# Patient Record
Sex: Female | Born: 1935 | Race: White | Hispanic: No | Smoking: Former smoker
Health system: Southern US, Community
[De-identification: ages and names within clinical notes are randomized; demographics above are authoritative.]

## PROBLEM LIST (undated history)

## (undated) DIAGNOSIS — C3490 Malignant neoplasm of unspecified part of unspecified bronchus or lung: Secondary | ICD-10-CM

## (undated) DIAGNOSIS — I10 Essential (primary) hypertension: Secondary | ICD-10-CM

---

## 1998-06-20 ENCOUNTER — Emergency Department (HOSPITAL_COMMUNITY): Admission: EM | Admit: 1998-06-20 | Discharge: 1998-06-20 | Payer: Self-pay | Admitting: Emergency Medicine

## 1998-06-22 ENCOUNTER — Emergency Department (HOSPITAL_COMMUNITY): Admission: EM | Admit: 1998-06-22 | Discharge: 1998-06-22 | Payer: Self-pay | Admitting: Emergency Medicine

## 2002-10-22 ENCOUNTER — Other Ambulatory Visit: Admission: RE | Admit: 2002-10-22 | Discharge: 2002-10-22 | Payer: Self-pay | Admitting: Obstetrics and Gynecology

## 2004-02-03 ENCOUNTER — Encounter: Admission: RE | Admit: 2004-02-03 | Discharge: 2004-02-03 | Payer: Self-pay | Admitting: Family Medicine

## 2004-02-04 ENCOUNTER — Encounter: Admission: RE | Admit: 2004-02-04 | Discharge: 2004-02-04 | Payer: Self-pay | Admitting: Family Medicine

## 2004-02-06 ENCOUNTER — Ambulatory Visit (HOSPITAL_COMMUNITY): Admission: RE | Admit: 2004-02-06 | Discharge: 2004-02-06 | Payer: Self-pay | Admitting: Family Medicine

## 2004-02-06 ENCOUNTER — Encounter: Admission: RE | Admit: 2004-02-06 | Discharge: 2004-02-06 | Payer: Self-pay | Admitting: Family Medicine

## 2004-02-24 ENCOUNTER — Encounter (INDEPENDENT_AMBULATORY_CARE_PROVIDER_SITE_OTHER): Payer: Self-pay | Admitting: *Deleted

## 2004-02-24 ENCOUNTER — Inpatient Hospital Stay (HOSPITAL_COMMUNITY): Admission: RE | Admit: 2004-02-24 | Discharge: 2004-03-02 | Payer: Self-pay | Admitting: Thoracic Surgery

## 2004-02-24 HISTORY — PX: LUNG CANCER SURGERY: SHX702

## 2004-03-11 ENCOUNTER — Encounter: Admission: RE | Admit: 2004-03-11 | Discharge: 2004-03-11 | Payer: Self-pay | Admitting: Thoracic Surgery

## 2004-03-25 ENCOUNTER — Encounter: Admission: RE | Admit: 2004-03-25 | Discharge: 2004-03-25 | Payer: Self-pay | Admitting: Thoracic Surgery

## 2004-05-06 ENCOUNTER — Encounter: Admission: RE | Admit: 2004-05-06 | Discharge: 2004-05-06 | Payer: Self-pay | Admitting: Thoracic Surgery

## 2004-08-05 ENCOUNTER — Encounter: Admission: RE | Admit: 2004-08-05 | Discharge: 2004-08-05 | Payer: Self-pay | Admitting: Thoracic Surgery

## 2004-11-24 ENCOUNTER — Encounter: Admission: RE | Admit: 2004-11-24 | Discharge: 2004-11-24 | Payer: Self-pay | Admitting: Thoracic Surgery

## 2005-02-10 ENCOUNTER — Ambulatory Visit: Payer: Self-pay | Admitting: Oncology

## 2005-02-23 ENCOUNTER — Encounter: Admission: RE | Admit: 2005-02-23 | Discharge: 2005-02-23 | Payer: Self-pay | Admitting: Thoracic Surgery

## 2005-06-03 ENCOUNTER — Encounter: Admission: RE | Admit: 2005-06-03 | Discharge: 2005-06-03 | Payer: Self-pay | Admitting: Oncology

## 2005-07-16 ENCOUNTER — Ambulatory Visit: Payer: Self-pay | Admitting: Family Medicine

## 2005-07-30 ENCOUNTER — Encounter: Admission: RE | Admit: 2005-07-30 | Discharge: 2005-07-30 | Payer: Self-pay | Admitting: Family Medicine

## 2005-07-30 ENCOUNTER — Ambulatory Visit: Payer: Self-pay | Admitting: Family Medicine

## 2005-08-10 ENCOUNTER — Ambulatory Visit: Payer: Self-pay | Admitting: Family Medicine

## 2005-09-07 ENCOUNTER — Encounter: Admission: RE | Admit: 2005-09-07 | Discharge: 2005-09-07 | Payer: Self-pay | Admitting: Thoracic Surgery

## 2005-09-23 ENCOUNTER — Ambulatory Visit: Payer: Self-pay | Admitting: Oncology

## 2005-10-25 ENCOUNTER — Ambulatory Visit: Payer: Self-pay | Admitting: Family Medicine

## 2006-02-01 ENCOUNTER — Other Ambulatory Visit: Admission: RE | Admit: 2006-02-01 | Discharge: 2006-02-01 | Payer: Self-pay | Admitting: Family Medicine

## 2006-02-01 ENCOUNTER — Ambulatory Visit: Payer: Self-pay | Admitting: Family Medicine

## 2006-02-01 ENCOUNTER — Encounter: Payer: Self-pay | Admitting: Family Medicine

## 2006-02-15 ENCOUNTER — Ambulatory Visit: Payer: Self-pay | Admitting: Family Medicine

## 2006-03-08 ENCOUNTER — Encounter: Admission: RE | Admit: 2006-03-08 | Discharge: 2006-03-08 | Payer: Self-pay | Admitting: Thoracic Surgery

## 2006-04-01 ENCOUNTER — Ambulatory Visit: Payer: Self-pay | Admitting: Oncology

## 2006-04-04 LAB — CBC WITH DIFFERENTIAL/PLATELET
BASO%: 0.4 % (ref 0.0–2.0)
EOS%: 1.6 % (ref 0.0–7.0)
MCH: 30.3 pg (ref 26.0–34.0)
MCHC: 34.7 g/dL (ref 32.0–36.0)
MCV: 87.3 fL (ref 81.0–101.0)
MONO%: 11 % (ref 0.0–13.0)
RDW: 13.6 % (ref 11.3–14.5)
lymph#: 1.1 10*3/uL (ref 0.9–3.3)

## 2006-04-04 LAB — COMPREHENSIVE METABOLIC PANEL
ALT: 11 U/L (ref 0–40)
AST: 20 U/L (ref 0–37)
Albumin: 4.6 g/dL (ref 3.5–5.2)
Alkaline Phosphatase: 62 U/L (ref 39–117)
BUN: 9 mg/dL (ref 6–23)
Calcium: 9.5 mg/dL (ref 8.4–10.5)
Chloride: 97 mEq/L (ref 96–112)
Creatinine, Ser: 0.7 mg/dL (ref 0.4–1.2)
Potassium: 4.6 mEq/L (ref 3.5–5.3)

## 2006-04-29 ENCOUNTER — Ambulatory Visit: Payer: Self-pay | Admitting: Family Medicine

## 2006-05-06 ENCOUNTER — Ambulatory Visit: Payer: Self-pay | Admitting: Family Medicine

## 2006-06-21 ENCOUNTER — Ambulatory Visit: Payer: Self-pay | Admitting: Internal Medicine

## 2006-07-15 ENCOUNTER — Encounter: Admission: RE | Admit: 2006-07-15 | Discharge: 2006-07-15 | Payer: Self-pay | Admitting: Thoracic Surgery

## 2006-09-21 ENCOUNTER — Encounter: Admission: RE | Admit: 2006-09-21 | Discharge: 2006-09-21 | Payer: Self-pay | Admitting: Thoracic Surgery

## 2006-12-01 ENCOUNTER — Ambulatory Visit: Payer: Self-pay | Admitting: Oncology

## 2006-12-05 LAB — COMPREHENSIVE METABOLIC PANEL
ALT: 10 U/L (ref 0–35)
AST: 17 U/L (ref 0–37)
Albumin: 4.5 g/dL (ref 3.5–5.2)
Alkaline Phosphatase: 62 U/L (ref 39–117)
Calcium: 9.2 mg/dL (ref 8.4–10.5)
Chloride: 100 mEq/L (ref 96–112)
Creatinine, Ser: 0.64 mg/dL (ref 0.40–1.20)
Potassium: 4.1 mEq/L (ref 3.5–5.3)

## 2006-12-05 LAB — CBC WITH DIFFERENTIAL/PLATELET
BASO%: 0.4 % (ref 0.0–2.0)
EOS%: 2.6 % (ref 0.0–7.0)
MCH: 30.8 pg (ref 26.0–34.0)
MCHC: 34.8 g/dL (ref 32.0–36.0)
RDW: 13.3 % (ref 11.3–14.5)
lymph#: 1.2 10*3/uL (ref 0.9–3.3)

## 2007-03-29 ENCOUNTER — Encounter: Admission: RE | Admit: 2007-03-29 | Discharge: 2007-03-29 | Payer: Self-pay | Admitting: Thoracic Surgery

## 2007-03-29 ENCOUNTER — Ambulatory Visit: Payer: Self-pay | Admitting: Thoracic Surgery

## 2007-09-15 ENCOUNTER — Encounter: Admission: RE | Admit: 2007-09-15 | Discharge: 2007-09-15 | Payer: Self-pay | Admitting: Family Medicine

## 2007-10-04 ENCOUNTER — Encounter: Admission: RE | Admit: 2007-10-04 | Discharge: 2007-10-04 | Payer: Self-pay | Admitting: Thoracic Surgery

## 2007-10-04 ENCOUNTER — Ambulatory Visit: Payer: Self-pay | Admitting: Thoracic Surgery

## 2007-11-30 ENCOUNTER — Ambulatory Visit: Payer: Self-pay | Admitting: Oncology

## 2007-12-04 LAB — CBC WITH DIFFERENTIAL/PLATELET
BASO%: 0.6 % (ref 0.0–2.0)
Basophils Absolute: 0 10*3/uL (ref 0.0–0.1)
EOS%: 2.7 % (ref 0.0–7.0)
Eosinophils Absolute: 0.2 10*3/uL (ref 0.0–0.5)
HCT: 36.5 % (ref 34.8–46.6)
HGB: 13.1 g/dL (ref 11.6–15.9)
LYMPH%: 19.2 % (ref 14.0–48.0)
MCH: 31.1 pg (ref 26.0–34.0)
MCHC: 35.8 g/dL (ref 32.0–36.0)
MCV: 86.9 fL (ref 81.0–101.0)
MONO#: 0.7 10*3/uL (ref 0.1–0.9)
MONO%: 12.2 % (ref 0.0–13.0)
NEUT#: 4 10*3/uL (ref 1.5–6.5)
NEUT%: 65.3 % (ref 39.6–76.8)
Platelets: 230 10*3/uL (ref 145–400)
RBC: 4.2 10*6/uL (ref 3.70–5.32)
RDW: 13.1 % (ref 11.3–14.5)
WBC: 6.1 10*3/uL (ref 3.9–10.0)
lymph#: 1.2 10*3/uL (ref 0.9–3.3)

## 2007-12-04 LAB — COMPREHENSIVE METABOLIC PANEL
ALT: 16 U/L (ref 0–35)
AST: 20 U/L (ref 0–37)
Albumin: 4.6 g/dL (ref 3.5–5.2)
Alkaline Phosphatase: 64 U/L (ref 39–117)
BUN: 14 mg/dL (ref 6–23)
CO2: 26 mEq/L (ref 19–32)
Calcium: 9.9 mg/dL (ref 8.4–10.5)
Chloride: 98 mEq/L (ref 96–112)
Creatinine, Ser: 0.73 mg/dL (ref 0.40–1.20)
Glucose, Bld: 113 mg/dL — ABNORMAL HIGH (ref 70–99)
Potassium: 4.1 mEq/L (ref 3.5–5.3)
Sodium: 137 mEq/L (ref 135–145)
Total Bilirubin: 0.5 mg/dL (ref 0.3–1.2)
Total Protein: 7.3 g/dL (ref 6.0–8.3)

## 2007-12-05 ENCOUNTER — Ambulatory Visit (HOSPITAL_COMMUNITY): Admission: RE | Admit: 2007-12-05 | Discharge: 2007-12-05 | Payer: Self-pay | Admitting: Oncology

## 2007-12-07 ENCOUNTER — Ambulatory Visit: Payer: Self-pay | Admitting: Family Medicine

## 2007-12-07 DIAGNOSIS — IMO0002 Reserved for concepts with insufficient information to code with codable children: Secondary | ICD-10-CM

## 2008-03-27 ENCOUNTER — Encounter: Admission: RE | Admit: 2008-03-27 | Discharge: 2008-03-27 | Payer: Self-pay | Admitting: Thoracic Surgery

## 2008-03-27 ENCOUNTER — Ambulatory Visit: Payer: Self-pay | Admitting: Thoracic Surgery

## 2008-05-23 ENCOUNTER — Ambulatory Visit: Payer: Self-pay | Admitting: Internal Medicine

## 2008-06-14 ENCOUNTER — Ambulatory Visit: Payer: Self-pay | Admitting: Family Medicine

## 2008-06-14 DIAGNOSIS — L255 Unspecified contact dermatitis due to plants, except food: Secondary | ICD-10-CM | POA: Insufficient documentation

## 2008-09-17 ENCOUNTER — Encounter: Payer: Self-pay | Admitting: Family Medicine

## 2008-09-18 ENCOUNTER — Ambulatory Visit: Payer: Self-pay | Admitting: Family Medicine

## 2008-09-18 ENCOUNTER — Other Ambulatory Visit: Admission: RE | Admit: 2008-09-18 | Discharge: 2008-09-18 | Payer: Self-pay | Admitting: Family Medicine

## 2008-09-18 ENCOUNTER — Encounter: Payer: Self-pay | Admitting: Family Medicine

## 2008-09-18 DIAGNOSIS — I10 Essential (primary) hypertension: Secondary | ICD-10-CM | POA: Insufficient documentation

## 2008-09-18 DIAGNOSIS — E785 Hyperlipidemia, unspecified: Secondary | ICD-10-CM

## 2008-09-18 DIAGNOSIS — Z85118 Personal history of other malignant neoplasm of bronchus and lung: Secondary | ICD-10-CM

## 2008-09-18 DIAGNOSIS — M818 Other osteoporosis without current pathological fracture: Secondary | ICD-10-CM

## 2008-09-18 LAB — CONVERTED CEMR LAB
Bilirubin Urine: NEGATIVE
Nitrite: NEGATIVE
Urobilinogen, UA: 0.2

## 2008-09-19 LAB — CONVERTED CEMR LAB
ALT: 16 units/L (ref 0–35)
Basophils Relative: 0.5 % (ref 0.0–3.0)
Bilirubin, Direct: 0.1 mg/dL (ref 0.0–0.3)
CO2: 30 meq/L (ref 19–32)
Calcium: 9.5 mg/dL (ref 8.4–10.5)
Creatinine, Ser: 0.7 mg/dL (ref 0.4–1.2)
Eosinophils Relative: 1.7 % (ref 0.0–5.0)
Glucose, Bld: 107 mg/dL — ABNORMAL HIGH (ref 70–99)
Hemoglobin: 14 g/dL (ref 12.0–15.0)
LDL Cholesterol: 90 mg/dL (ref 0–99)
Lymphocytes Relative: 19.4 % (ref 12.0–46.0)
Monocytes Relative: 13.1 % — ABNORMAL HIGH (ref 3.0–12.0)
Neutro Abs: 3.5 10*3/uL (ref 1.4–7.7)
RBC: 4.43 M/uL (ref 3.87–5.11)
TSH: 1.78 microintl units/mL (ref 0.35–5.50)
Total CHOL/HDL Ratio: 3.3
Total Protein: 7.5 g/dL (ref 6.0–8.3)

## 2008-10-03 ENCOUNTER — Ambulatory Visit: Payer: Self-pay | Admitting: Thoracic Surgery

## 2008-10-03 ENCOUNTER — Encounter: Admission: RE | Admit: 2008-10-03 | Discharge: 2008-10-03 | Payer: Self-pay | Admitting: Thoracic Surgery

## 2008-10-15 ENCOUNTER — Encounter: Admission: RE | Admit: 2008-10-15 | Discharge: 2008-10-15 | Payer: Self-pay | Admitting: Family Medicine

## 2008-11-28 ENCOUNTER — Ambulatory Visit: Payer: Self-pay | Admitting: Oncology

## 2009-04-09 ENCOUNTER — Ambulatory Visit: Payer: Self-pay | Admitting: Thoracic Surgery

## 2009-04-09 ENCOUNTER — Encounter: Admission: RE | Admit: 2009-04-09 | Discharge: 2009-04-09 | Payer: Self-pay | Admitting: Thoracic Surgery

## 2009-05-28 ENCOUNTER — Encounter (INDEPENDENT_AMBULATORY_CARE_PROVIDER_SITE_OTHER): Payer: Self-pay | Admitting: *Deleted

## 2009-09-15 ENCOUNTER — Telehealth: Payer: Self-pay | Admitting: Family Medicine

## 2009-11-11 ENCOUNTER — Encounter: Admission: RE | Admit: 2009-11-11 | Discharge: 2009-11-11 | Payer: Self-pay | Admitting: Oncology

## 2009-12-01 ENCOUNTER — Ambulatory Visit: Payer: Self-pay | Admitting: Oncology

## 2009-12-03 ENCOUNTER — Ambulatory Visit: Payer: Self-pay | Admitting: Family Medicine

## 2009-12-03 DIAGNOSIS — R51 Headache: Secondary | ICD-10-CM

## 2009-12-03 DIAGNOSIS — R519 Headache, unspecified: Secondary | ICD-10-CM | POA: Insufficient documentation

## 2009-12-04 LAB — CBC WITH DIFFERENTIAL/PLATELET
BASO%: 1.3 % (ref 0.0–2.0)
HCT: 37 % (ref 34.8–46.6)
MCHC: 34.6 g/dL (ref 31.5–36.0)
MONO#: 0.8 10*3/uL (ref 0.1–0.9)
NEUT%: 68 % (ref 38.4–76.8)
RBC: 4.11 10*6/uL (ref 3.70–5.45)
WBC: 5.9 10*3/uL (ref 3.9–10.3)
lymph#: 0.9 10*3/uL (ref 0.9–3.3)

## 2009-12-04 LAB — COMPREHENSIVE METABOLIC PANEL
ALT: 13 U/L (ref 0–35)
Albumin: 4.5 g/dL (ref 3.5–5.2)
CO2: 29 mEq/L (ref 19–32)
Calcium: 9.9 mg/dL (ref 8.4–10.5)
Chloride: 94 mEq/L — ABNORMAL LOW (ref 96–112)
Sodium: 133 mEq/L — ABNORMAL LOW (ref 135–145)
Total Protein: 7.3 g/dL (ref 6.0–8.3)

## 2009-12-04 LAB — LACTATE DEHYDROGENASE: LDH: 171 U/L (ref 94–250)

## 2009-12-15 ENCOUNTER — Ambulatory Visit: Payer: Self-pay | Admitting: Family Medicine

## 2010-01-20 ENCOUNTER — Telehealth: Payer: Self-pay | Admitting: Internal Medicine

## 2010-02-02 ENCOUNTER — Ambulatory Visit: Payer: Self-pay | Admitting: Family Medicine

## 2010-04-09 ENCOUNTER — Ambulatory Visit (HOSPITAL_COMMUNITY): Admission: RE | Admit: 2010-04-09 | Discharge: 2010-04-09 | Payer: Self-pay | Admitting: Oncology

## 2010-12-02 ENCOUNTER — Ambulatory Visit: Payer: Self-pay | Admitting: Oncology

## 2010-12-04 ENCOUNTER — Encounter: Payer: Self-pay | Admitting: Family Medicine

## 2010-12-04 LAB — CBC WITH DIFFERENTIAL/PLATELET
Basophils Absolute: 0 10*3/uL (ref 0.0–0.1)
Eosinophils Absolute: 0.1 10*3/uL (ref 0.0–0.5)
LYMPH%: 18.1 % (ref 14.0–49.7)
MCV: 90.2 fL (ref 79.5–101.0)
MONO%: 13.4 % (ref 0.0–14.0)
NEUT%: 66.4 % (ref 38.4–76.8)
Platelets: 229 10*3/uL (ref 145–400)
RDW: 12.9 % (ref 11.2–14.5)
WBC: 5.6 10*3/uL (ref 3.9–10.3)

## 2010-12-04 LAB — COMPREHENSIVE METABOLIC PANEL
Albumin: 4.6 g/dL (ref 3.5–5.2)
Alkaline Phosphatase: 65 U/L (ref 39–117)
BUN: 6 mg/dL (ref 6–23)
Creatinine, Ser: 0.75 mg/dL (ref 0.40–1.20)
Glucose, Bld: 97 mg/dL (ref 70–99)
Potassium: 4.3 mEq/L (ref 3.5–5.3)

## 2010-12-29 ENCOUNTER — Encounter
Admission: RE | Admit: 2010-12-29 | Discharge: 2010-12-29 | Payer: Self-pay | Source: Home / Self Care | Attending: Family Medicine | Admitting: Family Medicine

## 2011-01-16 ENCOUNTER — Encounter: Payer: Self-pay | Admitting: Family Medicine

## 2011-01-16 ENCOUNTER — Encounter: Payer: Self-pay | Admitting: Thoracic Surgery

## 2011-01-17 ENCOUNTER — Encounter: Payer: Self-pay | Admitting: Thoracic Surgery

## 2011-01-24 LAB — CONVERTED CEMR LAB
Albumin: 4.3 g/dL (ref 3.5–5.2)
Basophils Relative: 0.1 % (ref 0.0–3.0)
CO2: 29 meq/L (ref 19–32)
Chloride: 101 meq/L (ref 96–112)
Cholesterol: 152 mg/dL (ref 0–200)
Creatinine, Ser: 0.7 mg/dL (ref 0.4–1.2)
Direct LDL: 86.9 mg/dL
Eosinophils Absolute: 0.1 10*3/uL (ref 0.0–0.7)
HCT: 37.9 % (ref 36.0–46.0)
Hemoglobin: 13 g/dL (ref 12.0–15.0)
Lymphs Abs: 1.3 10*3/uL (ref 0.7–4.0)
MCHC: 34.3 g/dL (ref 30.0–36.0)
MCV: 90.4 fL (ref 78.0–100.0)
Monocytes Absolute: 0.7 10*3/uL (ref 0.1–1.0)
Neutro Abs: 4.6 10*3/uL (ref 1.4–7.7)
Neutrophils Relative %: 68 % (ref 43.0–77.0)
RBC: 4.19 M/uL (ref 3.87–5.11)
TSH: 1.58 microintl units/mL (ref 0.35–5.50)
Total CHOL/HDL Ratio: 3
Total Protein: 7.6 g/dL (ref 6.0–8.3)

## 2011-01-26 NOTE — Progress Notes (Signed)
Summary: Schedule recall colon  Phone Note Outgoing Call Call back at [facility] Phone [phone]   Call placed by: Christie Nottingham CMA Duncan Dull),  January 20, 2010 1:46 PM Call placed to: Patient Summary of Call: Called pt to schedule recall colon. Pt states she is going on a trip soon and will be back in a month. Pt wants to schedule her colon after she gets back from her trip but she does have a reminder note to herself to call back and schedule. I informed pt of the importance of scheduling her recall colon at this time due to her prior polyps. Pt agreed and states she will call back. Initial call taken by: Christie Nottingham CMA Duncan Dull),  January 20, 2010 1:48 PM

## 2011-01-26 NOTE — Assessment & Plan Note (Signed)
Summary: PT WILL COME IN FASTING // RS   Vital Signs:  Patient profile:   76 year old female Menstrual status:  postmenopausal Height:      63.25 inches Weight:      155 pounds Temp:     98.4 degrees F oral BP sitting:   128 / 86  (left arm) Cuff size:   regular  Vitals Entered By: Kern Reap CMA Duncan Dull) (February 02, 2010 9:53 AM)  Reason for Visit cpx  History of Present Illness: Frances Gonzalez is a 76 year old divorced female, ex-smoker.......... status post surgery 2005 for lung cancer, who comes in today for follow-up and evaluation.  She takes simvastatin 20 mg nightly for hyperlipidemia.  Will check lipid panel.  She takes losartan 100 -- 25 daily for hypertension.  BP 120/86.  BPs at home are averaging 110/70.  She denies being lightheaded when she stands.  Dr. Dennie Bible her surgeon has released her .  Dr. Roe Coombs is following her on an annual basis because of a history of lung cancer.  She had a CT scan last April, which was normal.  He is ordered.  A follow-up chest x-ray for this April.  She remains asymptomatic and  She gets routine eye care.  Dental care.  Colonoscopy done, and GI because of a history of colon polyps.  Tetanus 2006, flu, 2009, Pneumovax 2005, shingles 2009.  Allergies (verified): No Known Drug Allergies  Past History:  Past medical, surgical, family and social histories (including risk factors) reviewed, and no changes noted (except as noted below).  Past Medical History: Reviewed history from 09/18/2008 and no changes required. lung cancer 2005 colon polyps 2005 osteoporosis COPD hyperlipidemia Hypertension  Family History: Reviewed history and no changes required.  Social History: Reviewed history from 09/18/2008 and no changes required. Retired Single Former Smoker Alcohol use-no Drug use-no Regular exercise-yes  Review of Systems      See HPI  Physical Exam  General:  Well-developed,well-nourished,in no acute distress; alert,appropriate  and cooperative throughout examination Head:  Normocephalic and atraumatic without obvious abnormalities. No apparent alopecia or balding. Eyes:  No corneal or conjunctival inflammation noted. EOMI. Perrla. Funduscopic exam benign, without hemorrhages, exudates or papilledema. Vision grossly normal.juvenile cataracts Ears:  External ear exam shows no significant lesions or deformities.  Otoscopic examination reveals clear canals, tympanic membranes are intact bilaterally without bulging, retraction, inflammation or discharge. Hearing is grossly normal bilaterally. Nose:  External nasal examination shows no deformity or inflammation. Nasal mucosa are pink and moist without lesions or exudates. Mouth:  Oral mucosa and oropharynx without lesions or exudates.  Teeth in good repair. Neck:  No deformities, masses, or tenderness noted. Chest Wall:  scar right lateral chest wall from previous lung surgery Breasts:  exam negative, except for a pea-sized, soft, movable, fibrous cystic lesion, left breast 12 o'clock, 1 inch from the nipple.  Mammography in the fall.  Normal this lesion is known to the patient.  It's unchanged in her exam also Lungs:  Normal respiratory effort, chest expands symmetrically. Lungs are clear to auscultation, no crackles or wheezes. Heart:  Normal rate and regular rhythm. S1 and S2 normal without gallop, murmur, click, rub or other extra sounds. Abdomen:  Bowel sounds positive,abdomen soft and non-tender without masses, organomegaly or hernias noted. Msk:  No deformity or scoliosis noted of thoracic or lumbar spine.   Pulses:  R and L carotid,radial,femoral,dorsalis pedis and posterior tibial pulses are full and equal bilaterally Extremities:  No clubbing, cyanosis, edema, or  deformity noted with normal full range of motion of all joints.   Neurologic:  No cranial nerve deficits noted. Station and gait are normal. Plantar reflexes are down-going bilaterally. DTRs are symmetrical  throughout. Sensory, motor and coordinative functions appear intact. Skin:  Intact without suspicious lesions or rashes Cervical Nodes:  No lymphadenopathy noted Axillary Nodes:  No palpable lymphadenopathy Inguinal Nodes:  No significant adenopathy Psych:  Cognition and judgment appear intact. Alert and cooperative with normal attention span and concentration. No apparent delusions, illusions, hallucinations   Impression & Recommendations:  Problem # 1:  HYPERLIPIDEMIA (ICD-272.4) Assessment Improved  Her updated medication list for this problem includes:    Simvastatin 20 Mg Tabs (Simvastatin) .Marland Kitchen... 1/2 tab at bedtime  Orders: Venipuncture (78295) TLB-Lipid Panel (80061-LIPID) TLB-BMP (Basic Metabolic Panel-BMET) (80048-METABOL) TLB-CBC Platelet - w/Differential (85025-CBCD) TLB-Hepatic/Liver Function Pnl (80076-HEPATIC) TLB-TSH (Thyroid Stimulating Hormone) (62130-QMV) Prescription Created Electronically [phone]) UA Dipstick w/o Micro (automated)  (81003)  Problem # 2:  HYPERTENSION (ICD-401.9) Assessment: Improved  The following medications were removed from the medication list:    Hydrochlorothiazide 25 Mg Tabs (Hydrochlorothiazide) .Marland Kitchen... 1 tab qam    Diovan 160 Mg Tabs (Valsartan) ..... One by mouth qd Her updated medication list for this problem includes:    Hyzaar 100-25 Mg Tabs (Losartan potassium-hctz) .Marland Kitchen... 1/2 qam  Orders: Venipuncture (62952) TLB-Lipid Panel (80061-LIPID) TLB-BMP (Basic Metabolic Panel-BMET) (80048-METABOL) TLB-CBC Platelet - w/Differential (85025-CBCD) TLB-Hepatic/Liver Function Pnl (80076-HEPATIC) TLB-TSH (Thyroid Stimulating Hormone) (84132-GMW) Prescription Created Electronically [phone]) UA Dipstick w/o Micro (automated)  (81003) EKG w/ Interpretation (93000)  Problem # 3:  OTHER OSTEOPOROSIS (ICD-733.09) Assessment: Unchanged  Orders: Venipuncture (53664) TLB-Lipid Panel (80061-LIPID) TLB-BMP (Basic Metabolic Panel-BMET)  (80048-METABOL) TLB-CBC Platelet - w/Differential (85025-CBCD) TLB-Hepatic/Liver Function Pnl (80076-HEPATIC) TLB-TSH (Thyroid Stimulating Hormone) (40347-QQV) Prescription Created Electronically [phone]) UA Dipstick w/o Micro (automated)  (81003)  Complete Medication List: 1)  Simvastatin 20 Mg Tabs (Simvastatin) .... 1/2 tab at bedtime 2)  Prilosec 20  .... 1/2 tab once daily 3)  Mvi  4)  Caltrate  5)  Hyzaar 100-25 Mg Tabs (Losartan potassium-hctz) .... 1/2 qam 6)  Aspir-low 81 Mg Tbec (Aspirin) .... Once daily 7)  Calcuim Plus Vit D 1800  .... Once daily  Patient Instructions: 1)  Please schedule a follow-up appointment in 1 year. 2)  It is important that you exercise regularly at least 20 minutes 5 times a week. If you develop chest pain, have severe difficulty breathing, or feel very tired , stop exercising immediately and seek medical attention. 3)  Schedule your mammogram. 4)  Schedule a colonoscopy/sigmoidoscopy to help detect colon cancer. 5)  Take calcium +Vitamin D daily. 6)  Take an Aspirin every day. 7)  also split the losartan.... one half tab daily.  Check her BP daily for 4 weeks to be sure your blood pressure stays normal Prescriptions: SIMVASTATIN 20 MG  TABS (SIMVASTATIN) 1/2 TAB at bedtime  #50 x 3   Entered and Authorized by:   Roderick Pee MD   Signed by:   Roderick Pee MD on 02/02/2010   Method used:   Electronically to        [facility]  [address] [phone]* (retail)       [address]       [place], [place]  [address]       Ph: [phone] or [phone]       Fax: [phone]   RxID:   [id number]  100-25 MG TABS (LOSARTAN POTASSIUM-HCTZ) 1/2 qam  #50 x 3   Entered and Authorized by:   Roderick Pee MD   Signed by:   Roderick Pee MD on 02/02/2010   Method used:   Electronically to        [facility]  [address] [phone]* (retail)       [address]       [place], [place]  [address]       Ph:  [phone] or [phone]       Fax: [phone]   RxID:   [id number]

## 2011-01-28 NOTE — Letter (Signed)
Summary:  Cancer Center  [facility]   Imported By: Maryln Gottron 12/11/2010 13:32:31  _____________________________________________________________________  External Attachment:    Type:   Image     Comment:   External Document

## 2011-03-18 ENCOUNTER — Other Ambulatory Visit: Payer: Self-pay | Admitting: Family Medicine

## 2011-04-13 ENCOUNTER — Other Ambulatory Visit (HOSPITAL_COMMUNITY): Payer: Self-pay | Admitting: Oncology

## 2011-04-13 ENCOUNTER — Ambulatory Visit (HOSPITAL_COMMUNITY)
Admission: RE | Admit: 2011-04-13 | Discharge: 2011-04-13 | Disposition: A | Discharge status: Home / Self Care | Payer: Medicare Other | Source: Ambulatory Visit | Attending: Oncology | Admitting: Oncology

## 2011-04-13 DIAGNOSIS — C3490 Malignant neoplasm of unspecified part of unspecified bronchus or lung: Secondary | ICD-10-CM | POA: Insufficient documentation

## 2011-04-13 DIAGNOSIS — X58XXXA Exposure to other specified factors, initial encounter: Secondary | ICD-10-CM | POA: Insufficient documentation

## 2011-04-13 DIAGNOSIS — IMO0002 Reserved for concepts with insufficient information to code with codable children: Secondary | ICD-10-CM | POA: Insufficient documentation

## 2011-04-13 DIAGNOSIS — Z09 Encounter for follow-up examination after completed treatment for conditions other than malignant neoplasm: Secondary | ICD-10-CM | POA: Insufficient documentation

## 2011-05-11 NOTE — Letter (Signed)
March 27, 2008   Samul Dada, M.D.  [address]  [place], [place] [address]   Re:  INNA, TISDELL                DOB:  [birth year]   Dear Roe Coombs:   I saw Ms. Luppino back in the office today.  Her CT scan at four years  shows no evidence of recurrence of her cancer.  She has a small little  nodule in the left lower lobe that has been stable.  She is doing well  overall.  Her blood pressure was a little elevated at 180/90.  Pulse 84,  respirations 18.  Saturations were 94%.  I plan to see her back again in  six months with a chest x-ray.   Ines Bloomer, M.D.  Electronically Signed   DPB/MEDQ  D:  03/27/2008  T:  03/27/2008  Job:  [id number]

## 2011-05-11 NOTE — Letter (Signed)
October 04, 2007   Samul Dada, M.D.  [address]  [place],  [place] [address]   Re:  ALIZZON, DIOGUARDI                DOB:  11-21-1935   Dear Roe Coombs:   I saw Ms. Spampinato back in the office today. Her blood pressure was  176/90, pulse 78, respirations 18 and sats were 96%. Lungs were clear to  auscultation and percussion. She is 3-1/2 years since her surgery. Chest  x-ray showed changes on the right and questionable nodule on the left  side, but that was part of what they thought was a nipple shadow. I  reviewed her old CT scan from six months ago and there was a small  subdiaphragmatic 4-mm nodule at that time. Otherwise, I plan to see her  again in six months with another CT scan. I think she will be seeing you  in December.   Ines Bloomer, M.D.  Electronically Signed   DPB/MEDQ  D:  10/04/2007  T:  10/04/2007  Job:  [id number]

## 2011-05-11 NOTE — Assessment & Plan Note (Signed)
OFFICE VISIT   Frances Gonzalez, Frances Gonzalez  DOB:  1935-06-14                                        October 03, 2008  CHART #:  [id number]   The patient came today.  She is 4-1/2 years since we did a right upper  lobectomy for 1.6-cm adenocarcinoma.  Chest x-ray showed no evidence of  recurrence.  She is having some mild shortness of breath and suggested  she check with Dr. Tawanna Cooler on this.  Her blood pressure is 176/98, pulse  72, respirations 18, and sats were 96%.  I will see her back again in 6  months with a CT scan for a final check.   Ines Bloomer, M.D.  Electronically Signed   DPB/MEDQ  D:  10/03/2008  T:  10/03/2008  Job:  [id number]

## 2011-05-11 NOTE — Letter (Signed)
April 09, 2009   Samul Dada, MD  [address]  [place], [place] [address]   [name], Frances Gonzalez                DOB:  06-27-1935   Dear Dr. Arline Asp:   I saw the patient today.  It has been 5 years since we did her surgery  for a right upper lobectomy for T1, N0, M0 adenocarcinoma.  There is no  evidence of recurrence.  For the last year or so, we have been following  a 6-mm lesion in the left lower lobe and that has not changed in the CT  scan today.  Because it has been over 5 years, I will refer her back to  you for long-term followup.  I would suggest she get a CT scan at least  once a year.  She will be seeing you in December 2010.  Her blood  pressure is 160/100, pulse 69, respirations 18, and saturations were  97%.   Ines Bloomer, M.D.  Electronically Signed   DPB/MEDQ  D:  04/09/2009  T:  04/10/2009  Job:  [id number]

## 2011-05-14 NOTE — Op Note (Signed)
NAME:  EMERSEN, MASCARI                          ACCOUNT NO.:  [id number]   MEDICAL RECORD NO.:  [id number]                   PATIENT TYPE:  OIB   LOCATION:  2899                                 FACILITY:  [facility]   PHYSICIAN:  Ines Bloomer, M.D.              DATE OF BIRTH:  12-16-1935   DATE OF PROCEDURE:  02/24/2004  DATE OF DISCHARGE:                                 OPERATIVE REPORT   PREOPERATIVE DIAGNOSIS:  Right upper lobe mass.   POSTOPERATIVE DIAGNOSIS:  Nonsmall cell cancer, right upper lobe.   OPERATION PERFORMED:  Right video assisted thoracoscopic surgery, right  thoracotomy, right upper lobectomy.   SURGEON:  Ines Bloomer, M.D.   ASSISTANT:  Rowe Clack, P.A.-C.   ANESTHESIA:  General.   DESCRIPTION OF PROCEDURE:  After percutaneous insertion of all monitoring  lines, the patient underwent general anesthesia, was prepped and draped in  the usual sterile manner.  Turned to right lateral thoracotomy position and  a dual lumen tube was inserted.  Two trocar sites were made in the anterior  and posterior axillary line at the 7th intercostal space.  Two trocars were  inserted.  A 30 degree scope was inserted.  The patient had some emphysema  but no intrapleural spread of the cancer could be seen so posterolateral  thoracotomy was made, was carried down with electrocautery to the  subcutaneous tissue.  Latissimus was partially divided.  The serratus was  reflected anteriorly.  Two Tuffier's were placed at right angles.  The  lesion could be palpated in the apical posterior segment of the right upper  lobe.  It was about 1 cm in size.  Dissection was started in the fissure and  dissection was carried down to the pulmonary artery dissecting off 11R node.  The posterior branch of the pulmonary artery was then dissected out, doubly  ligated and divided with 2-0 silk, clipped with clips and divided.  Another  small branch of the transverse vein was also divided with 2-0  silks and  divided.  The fissure was divided with the EZ45 stapler with two  applications.  After this had been done attention was turned anteriorly and  dissection was carried superior to the azygous vein up into the mediastinum  and several 4R nodes were dissected free doing a mediastinal dissection.  Then the apical posterior branch was looped with vascular tape, stapled and  divided with the autosuture 30 stapler.  The superior pulmonary vein branch  to the upper lobe was dissected out, looped with a vascular tape, stapled  and divided with the autosuture stapler.  Finally attention was turned to  the bronchus and there was a large 10R node on the bronchus which was  dissected free.  There was worry this may involved with cancer.  Another 10R  node was dissected free and another 11R node was dissected free from the  bronchus intermedius.  The bronchus was stapled with a TL30 stapler divided  distally and reinforced with 4-0 Prolene in interrupted fashion.  The rest  of the minor fissure was stapled and divided with the EZ45 stapler.  The  right upper lobe removed, bronchial margins were negative.  There was an  adenocarcinoma about 1 to 1.5 cm in size in the right upper lobe.  The  inferior pulmonary  ligament was taken down and two chest tubes were brought through separate  stab wounds, tied in place with 0 silk.  The chest was closed with two  pericostals, #1 Vicryl in the muscle layer, 2-0 Vicryl in the subcutaneous  tissue and Dermabond for the skin.  The patient was then transferred to the  recovery room in stable condition.                                               Ines Bloomer, M.D.    DPB/MEDQ  D:  02/24/2004  T:  02/24/2004  Job:  [id number]

## 2011-05-14 NOTE — Consult Note (Signed)
NAME:  Frances, Gonzalez                          ACCOUNT NO.:  [id number]   MEDICAL RECORD NO.:  [id number]                   PATIENT TYPE:  OUT   LOCATION:  XRAY                                 FACILITY:  [facility]   PHYSICIAN:  Marlowe Kays, P.A.                  DATE OF BIRTH:  Apr 07, 1935   DATE OF CONSULTATION:  02/26/2004  DATE OF DISCHARGE:                                   CONSULTATION   REASON FOR CONSULTATION:  Lung cancer.   HISTORY OF PRESENT ILLNESS:  Frances Gonzalez is a pleasant 76 year old female  asked to see in consultation for evaluation of lung cancer.  She was found  to have a incidental right upper lobe mass per chest x-ray during a routine  physical exam after she complained of cough symptoms which lead to a  subsequent follow-up CT.  This showed a 12 mm nodule in the central right  upper lobe with spiculations.  No lymphadenopathy.  She also had a 5 mm left  lower lobe nodule as well.   On February 06, 2004, she underwent a PET scan showing increased uptake in  the right upper lobe nodule with an SUV of 5.1 suspicious for malignancy.  No activity in the left lower lobe nodule.  The right upper lobectomy was  performed on February 24, 2004, with a pathology report (Dr. Clelia Croft, [id number])  positive for a maximum tumor size of 1.6 cm, adenocarcinoma, acinar type,  grade 2, moderate differentiated negative margins, and negative pleural  involvement.  No direct extension or vascular invasion.  All six lymph nodes  were negative.  TNM code was T1 N0 MX, stage 1A.  A CT of the brain is  pending.   PAST MEDICAL HISTORY:  1. Lung cancer, as above, recently diagnosed.  2. History of tobacco abuse.   PAST SURGICAL HISTORY:  Status post right upper lobectomy by Dr. Ines Bloomer, February 24, 2004.   ALLERGIES:  No known drug allergies.   MEDICATIONS:  1. Dulcolax p.r.n.  2. Morphine as directed.  3. Benadryl.  4. Dalmane  5. Narcan.  6. Percocet.  7. Zosyn.  8.  Ultram p.r.n.  9. Ferrous sulfate p.o. b.i.d.  10.      Of note, the patient was on no medications prior to surgeries.   REVIEW OF SYMPTOMS:  See HPI for significant positives.  She denies any  fever, chills, weight loss, decrease in appetite, or headaches.  No blurred  vision.  No shortness of breath, dyspnea on exertion, productive cough,  hematemesis, or chest pain at this time.  No palpitations.  No abdominal  pain, nausea, vomiting, diarrhea, constipation, hematochezia, dysphagia,  dysuria, gross hematuria, dysesthesias, or peripheral edema.   FAMILY HISTORY:  Significant for mother dying at 73 with severe rheumatoid  arthritis.  No history of cancer in the family. No history of COPD, CAD,  diabetes in  the family.   SOCIAL HISTORY:  The patient is divorced.  She has one grown child.  She is  a Diplomatic Services operational officer at [employer]. She smokes one to one and a half pack of  tobacco for the last 50 years, although she quit a few weeks ago prior to  the procedure.  The patient lives in [place], [place].   HEALTH MAINTENANCE:  She has never had a colonoscopy.  She is up-to-date  with flu shot.  Her last bone density scan was in February of 2005.  She has  never had a transfusion.   PHYSICAL EXAMINATION:  GENERAL:  This is a well-developed, well-nourished 59-  year-old white female in no acute distress. Alert and oriented x 3.  VITAL SIGNS:  Blood pressure 105/55, pulse of 97, respirations of 20,  temperature 98.2.  Weight 138.8 pounds.  Pulse oximetry 90% on one liter.  HEENT:  Normocephalic and atraumatic.  PERRLA.  Oral mucosa without visible  thrush or lesion.  NECK:  Supple.  No JVD.  There is a right thick line.  No cervical or  supraclavicular masses.  CHEST:  Symmetrical on inspiration.  LUNGS:  No wheezes.  There is some diffuse rhonchi.  No rales.  No axillary  masses.  There is a presence of a chest tube in the right side of her  posterior chest.  Her right upper  lobectomy site is clean and dry.  BREASTS:  Without masses.  She had a mammogram on February 06, 2004 which  was negative.  CARDIOVASCULAR:  Regular rate and rhythm with no murmurs, rubs, or gallops.  ABDOMEN:  Soft and nontender.  Bowel sounds are full. No palpable spleen or  liver.  GENITOURINARY:  Deferred.  RECTAL:  Deferred.  EXTREMITIES:  No clubbing, cyanosis, or edema.  SKIN:  Without visible or active bleeding.  NEUROLOGICAL:  Nonfocal.   LABORATORY DATA:  Preoperatively, hemoglobin 14, hematocrit 40.2, WBC 5.9,  platelets 204,000.  MCV 90.4, PT 11.9, PTT 32, INR 0.8.  Urinalysis is  negative.  Sodium is 132, potassium 3.9, BUN 1, creatinine 0.5, glucose 153,  total bilirubin 0.5, alkaline phosphate 61, AST 25, ALT 12. Total protein  4.9, albumin 2.7, calcium 7.8 (it was 9.5 preoperatively).   ASSESSMENT:  Stage IA lung cancer.  The case has been discussed with Dr.  Gerarda Fraction Murinson, whose recommendations are no adjuvant therapy necessary  at this time. We will be happy to follow the patient with Dr. Ines Bloomer after discharge.  Thank you very much for allowing [place] to participate  in the care of Frances Gonzalez.                                               Marlowe Kays, P.A.    SW/MEDQ  D:  02/27/2004  T:  02/27/2004  Job:  [id number]   cc:   Tinnie Gens A. Tawanna Cooler, M.D. [facility]

## 2011-05-14 NOTE — Discharge Summary (Signed)
NAME:  Frances Gonzalez, Frances Gonzalez                          ACCOUNT NO.:  [id number]   MEDICAL RECORD NO.:  [id number]                   PATIENT TYPE:  INP   LOCATION:  3306                                 FACILITY:  [facility]   PHYSICIAN:  Ines Bloomer, M.D.              DATE OF BIRTH:  12/03/1935   DATE OF ADMISSION:  02/24/2004  DATE OF DISCHARGE:  03/02/2004                                 DISCHARGE SUMMARY   PRIMARY ADMITTING DIAGNOSIS:  Right upper lobe lung mass.   ADDITIONAL/DISCHARGE DIAGNOSES:  1. Moderately differentiated adenocarcinoma of the right lung.  2. Postoperative anemia.  3. History of tobacco abuse.   PROCEDURES:  Right video assisted thoracic surgery, right thoracotomy with  right upper lobectomy and lymph node dissection.   HISTORY:  The patient is a 76 year old white female who recently underwent a  preretirement physical exam by Dr. Kelle Darting.  At that time, she  complained of a cough and discussed with her physician having a chest x-ray  since she has a history of smoking.  This was obtained and showed an  incidental right upper lobe lung nodule.  She underwent a CT scan in  followup and this showed a 12-mm nodule in the central right upper lobe with  spiculation and no lymphadenopathy.  She also had a 5-mm left lower lobe  nodule as well.  She was subsequently referred for a PET scan and this  showed increased uptake in the right upper lobe nodule with an SUV of 5.1  which is consistent with malignancy.  There was no activity noted in the  left lower lobe nodule.  She continues to complain of cough which occurs in  the morning but has had no fevers, chills, weight loss, hemoptysis, or  shortness of breath.  She was referred to Dr. Dewayne Shorter for evaluation and  it was his opinion that she should undergo a right VATS at this time with  probable right upper lobectomy.   HOSPITAL COURSE:  She was admitted to [facility] on February 24, 2004 and underwent  a right VATS with right thoracotomy and right upper  lobectomy performed by Dr. Edwyna Shell.  Intraoperative frozen sections revealed  adenocarcinoma and all margins were negative for tumor.  She was transferred  from the recovery unit to the 3300 unit in stable condition.  She was  hemodynamically stable and doing well on postoperative day  #1.  At that time, her anterior chest tube was able to be removed.  She  began to ambulate.  The final pathology report revealed stage IA non-small  cell lung cancer.  For this reason, an oncology consultation was obtained.  Dr. Arline Asp saw the patient and reviewed her records and felt that no  adjuvant therapy was recommended at this time.  He also recommended  continued outpatient followup and this will be scheduled by his office.  She  has  continued to make progress.  Her remaining chest tube was removed on  postoperative day #3; however, after the tube was removed she had an apical  pneumonia which required reinsertion of a right-sided chest tube.  The tube  remained in place for 36 hours and a followup chest x-ray showed that the  tube had been pulled back into the chest wall and there was only a tiny  apical pneumothorax noted.  Since the tube was sitting in the chest wall, it  was removed and her pneumothorax has been observed.  Followup chest x-ray  showed the site to be decreasing and she has been completely asymptomatic  because of this.  She has been afebrile and all vital signs have been  stable.  She has been maintaining O2 saturations of greater than 90% on room  air.  CT of the brain was performed and showed no evidence of metastatic  disease.  Her most recent lab value showed a hemoglobin of 8.2 and  hematocrit of 23.7.  She was started on iron supplementation.  The remainder  of her labs were stable with white count 6.2, platelets 164, potassium 4,  creatinine 1, BUN 0.5.  It is anticipated that if she remained stable over  the next 24  hours and her chest x-ray continues to show improvement or at  least stability of her pneumothorax she should be able to be discharged home  on March 02, 2004.   DISCHARGE MEDICATIONS:  1. Ferrous sulfate 325 mg b.i.d. with food.  2. Tylox 1-2 q.4h. p.r.n. for pain.   DISCHARGE INSTRUCTIONS:  She is to refrain from driving, heavy lifting, or  strenuous activity.  She may continue daily ambulation and use of her  incentive spirometer.  She may shower daily and clean her incisions with  soap and water.  She will continue her same preoperative diet.   DISCHARGE FOLLOW UP:  She will see Dr. Edwyna Shell back in the office in one week  and our office will call to schedule this appointment.  She is asked to have  a chest x-ray at the [facility] one hour prior to this  appointment and bring her films to our office for Dr. Edwyna Shell to review.  She  will follow up with Dr. Arline Asp as directed.      Coral Ceo, P.A.                        Ines Bloomer, M.D.    GC/MEDQ  D:  03/01/2004  T:  03/02/2004  Job:  [id number]   cc:   Samul Dada, M.D.  [address] [facility]  [place]  [place] [address]  Fax: [phone]   Tinnie Gens A. Tawanna Cooler, M.D. [facility]

## 2011-05-14 NOTE — H&P (Signed)
NAME:  Frances Gonzalez, Frances Gonzalez                          ACCOUNT NO.:  [id number]   MEDICAL RECORD NO.:  [id number]                   PATIENT TYPE:  OIB   LOCATION:  NA                                   FACILITY:  [facility]   PHYSICIAN:  Ines Bloomer, M.D.              DATE OF BIRTH:  1935-06-25   DATE OF ADMISSION:  02/24/2004  DATE OF DISCHARGE:                                HISTORY & PHYSICAL   CHIEF COMPLAINT:  Right upper lobe lung mass.   HISTORY OF PRESENT ILLNESS:  The patient is a 76 year old white female who  was referred to Dr. Edwyna Shell by Eugenio Hoes. Tawanna Cooler, M.D. [facility] for evaluation of a  right upper lobe lung mass.  She recently underwent a preretirement physical  examination, at which time she complained of cough and discussed with her  physician having a chest x-ray since she has a history of smoking.  Chest x-  ray was obtained and showed an incidental right upper lobe lung nodule.  She  subsequently underwent a chest CT which showed a 12 mm nodule in the central  right upper lobe with speculation and no lymphadenopathy. She also had a 5  mm left lower lobe nodule as well.  She was subsequently referred for a PET  scan and this showed increased uptake in the right upper lobe nodule with an  SUV of 5.1 which is consistent with malignancy. There was no activity noted  in the left lower lobe nodule.  She does complain of cough which occurs in  the morning and she attributes to smoking, however, she denies any fevers,  chills, weight loss, hemoptysis, sputum production, shortness of breath,  dyspnea on exertion.  She was referred to Dr. Edwyna Shell and pulmonary function  tests were performed which showed an FEC of 2.55 or 88% of predicted and an  FEV1 of 1.88 or 81% of predicted.  It was Dr. Scheryl Darter recommendation that  she undergo a right VATS with probable right upper lobectomy at this time.   PAST MEDICAL HISTORY:  She has no significant past medical history and  denies specifically  hypertension, coronary artery disease, hyperlipidemia,  COPD, CVA, diabetes mellitus, or cancer.   PAST SURGICAL HISTORY:  None.   CURRENT MEDICATIONS:  None.   ALLERGIES:  No known drug allergies.   FAMILY HISTORY:  Her mother died at age 6 and had rheumatoid arthritis.  Her grandmother had emphysema and diabetes mellitus.  There is no other  family history of hypertension, coronary artery disease, COPD, CVA, or  cancer.   SOCIAL HISTORY:  She is divorced and has one child.  She is still employed  as a Diplomatic Services operational officer for [employer].  She has smoked 1 to 1-1/2 packs of  cigarettes per day for 49 to 50 years.  She quit about three days ago.  She  denies alcohol use.   REVIEW OF SYSTEMS:  See history of present illness for pertinent positives  and negatives.  She also denies weight loss, fevers, chills, recent  infections, TIA symptoms, visual changes, amaurosis fugax, dysphagia,  syncope, chest pain, palpitations, shortness of breath, dyspnea on exertion,  cough, orthopnea, paroxysmal nocturnal dyspnea, abdominal pain, nausea,  vomiting, diarrhea, constipation, hematochezia, melena, hematemesis,  hematuria, nocturia or dysuria, lower extremity edema, lower extremity  claudication symptoms, rest pain, intolerance to heat or cold, anxiety, or  depression.   PHYSICAL EXAMINATION:  VITAL SIGNS:  Blood pressure 122/70, pulse 90 and  regular, respirations 16 and unlabored.  GENERAL:  This is a well-nourished, well-developed, white female in no acute  distress.  HEENT:  Normocephalic and atraumatic.  Pupils equal, round, and reactive to  light and accommodation.  Extraocular movements intact.  TM's and canals are  clear.  Nares patent bilaterally.  Oropharynx is clear with an upper partial  in place.  NECK:  Supple without lymphadenopathy, thyromegaly, or carotid bruits.  HEART:  Regular rate and rhythm without murmurs, rubs, or gallops.  LUNGS:  Clear to auscultation.  ABDOMEN:   Soft, nontender, and nondistended with active bowel sounds in all  quadrants.  No masses or hepatosplenomegaly.  EXTREMITIES:  No cyanosis, clubbing, or edema.  Feet are warm and well-  perfused.  She has 2+ femoral, dorsalis pedis, and posterior tibial pulses  bilaterally.  NEUROLOGY:  She is alert and oriented x3. Cranial nerves II-XII grossly  intact.  Gait was steady and within normal limits.   ASSESSMENT:  This is a 76 year old white female with a history of tobacco  use who presents with a right upper lobe lung mass.   PLAN:  She will be admitted to [facility] on February 24, 2004, and  will undergo right VATS with a probable right upper lobectomy by Ines Bloomer, M.D.      Coral Ceo, P.A.                        Ines Bloomer, M.D.    GC/MEDQ  D:  02/20/2004  T:  02/20/2004  Job:  [id number]   cc:   Tinnie Gens A. Tawanna Cooler, M.D. [facility]

## 2011-06-28 ENCOUNTER — Other Ambulatory Visit: Payer: Self-pay | Admitting: Family Medicine

## 2011-07-11 ENCOUNTER — Other Ambulatory Visit: Payer: Self-pay | Admitting: Family Medicine

## 2011-10-04 ENCOUNTER — Other Ambulatory Visit: Payer: Self-pay | Admitting: Family Medicine

## 2011-11-28 ENCOUNTER — Emergency Department (HOSPITAL_BASED_OUTPATIENT_CLINIC_OR_DEPARTMENT_OTHER)
Admission: EM | Admit: 2011-11-28 | Discharge: 2011-11-28 | Disposition: A | Discharge status: Home / Self Care | Payer: Medicare Other | Attending: Emergency Medicine | Admitting: Emergency Medicine

## 2011-11-28 ENCOUNTER — Encounter: Payer: Self-pay | Admitting: *Deleted

## 2011-11-28 DIAGNOSIS — I10 Essential (primary) hypertension: Secondary | ICD-10-CM | POA: Insufficient documentation

## 2011-11-28 DIAGNOSIS — IMO0001 Reserved for inherently not codable concepts without codable children: Secondary | ICD-10-CM | POA: Insufficient documentation

## 2011-11-28 DIAGNOSIS — Y92009 Unspecified place in unspecified non-institutional (private) residence as the place of occurrence of the external cause: Secondary | ICD-10-CM | POA: Insufficient documentation

## 2011-11-28 DIAGNOSIS — Z79899 Other long term (current) drug therapy: Secondary | ICD-10-CM | POA: Insufficient documentation

## 2011-11-28 DIAGNOSIS — S61409A Unspecified open wound of unspecified hand, initial encounter: Secondary | ICD-10-CM | POA: Insufficient documentation

## 2011-11-28 DIAGNOSIS — W5501XA Bitten by cat, initial encounter: Secondary | ICD-10-CM

## 2011-11-28 HISTORY — DX: Malignant neoplasm of unspecified part of unspecified bronchus or lung: C34.90

## 2011-11-28 HISTORY — DX: Essential (primary) hypertension: I10

## 2011-11-28 MED ORDER — AMOXICILLIN-POT CLAVULANATE 875-125 MG PO TABS: 1.0000 | ORAL_TABLET | Freq: Two times a day (BID) | ORAL | Status: AC

## 2011-11-28 MED ORDER — AMOXICILLIN-POT CLAVULANATE 875-125 MG PO TABS
1.0000 | ORAL_TABLET | Freq: Once | ORAL | Status: AC
  Administered 2011-11-28: 1 via ORAL
  Filled 2011-11-28: qty 1

## 2011-11-28 NOTE — ED Provider Notes (Signed)
History  This chart was scribed for Cyndra Numbers, MD by Bennett Scrape. This patient was seen in room [facility] and the patient's care was started at 7:32PM.  CSN: [id number] Arrival date & time: 11/28/2011  6:43 PM   First MD Initiated Contact with Patient 11/28/11 1919      Chief Complaint  Patient presents with  . Animal Bite    The history is provided by the patient. No language interpreter was used.   Frances Gonzalez is a 76 y.o. female who presents to the Emergency Department complaining of an animal bite to the right hand. Pt states that she was bitten by her own indoor cat. Pain is a 2 out of 10. Pain is aggravated by touch. Pt states that the cat has current rabies vaccine. Pt states that she has h/o similar injuries that were treatable with antibiotics. Pt denies any other injuries or medical problems at this time.  Past Medical History  Diagnosis Date  . Hypertension   . Lung cancer     Past Surgical History  Procedure Date  . Lung cancer surgery     History reviewed. No pertinent family history.  History  Substance Use Topics  . Smoking status: Former Games developer  . Smokeless tobacco: Not on file  . Alcohol Use: No    OB History    Grav Para Term Preterm Abortions TAB SAB Ect Mult Living                  Review of Systems  Constitutional: Negative for fever and chills.  HENT: Negative for congestion, sore throat and neck pain.   Eyes: Negative for redness and visual disturbance.  Respiratory: Negative for cough and wheezing.   Cardiovascular: Negative for chest pain and leg swelling.  Gastrointestinal: Negative for nausea, vomiting and diarrhea.  Genitourinary: Negative for dysuria and hematuria.  Musculoskeletal: Negative for back pain and joint swelling.  Skin: Positive for wound (to the right hand). Negative for rash.  Neurological: Negative for light-headedness and headaches.    Allergies  Review of patient's allergies indicates no known  allergies.  Home Medications   Current Outpatient Rx  Name Route Sig Dispense Refill  . ASPIRIN 81 MG PO TABS Oral Take 81 mg by mouth daily.      Marland Kitchen CALCIUM 600 + D PO Oral Take 1,800 mg by mouth daily.      Marland Kitchen LOSARTAN POTASSIUM-HCTZ 100-25 MG PO TABS  TAKE 1/2 TABLET EVERY MORNING 50 tablet 0  . THERA M PLUS PO TABS Oral Take 1 tablet by mouth daily.      Marland Kitchen NAPHAZOLINE HCL 0.012 % OP SOLN Both Eyes Place 1 drop into both eyes every morning.      Marland Kitchen OMEPRAZOLE MAGNESIUM 20 MG PO TBEC Oral Take 10 mg by mouth daily.      Marland Kitchen SIMVASTATIN 20 MG PO TABS  1/2 TAB AT BEDTIME 90 tablet 0    Time for an office visit    Triage Vitals: BP 156/92  Pulse 93  Temp(Src) 97.9 F (36.6 C) (Oral)  Resp 20  Ht 5\' 4"  (1.626 m)  Wt 150 lb (68.04 kg)  BMI 25.75 kg/m2  SpO2 96%  Physical Exam  Nursing note and vitals reviewed. Constitutional: She is oriented to person, place, and time. She appears well-developed and well-nourished.  HENT:  Head: Normocephalic and atraumatic.  Eyes: Conjunctivae and EOM are normal. Pupils are equal, round, and reactive to light.  Neck: Neck supple. No  tracheal deviation present.  Cardiovascular: Normal rate, regular rhythm and normal heart sounds.   Pulmonary/Chest: Effort normal and breath sounds normal.  Abdominal: Soft. Bowel sounds are normal. There is no tenderness.  Musculoskeletal: Normal range of motion. She exhibits edema.       1 cm of swelling around puncture wound to the back side of the right hand, no centration or fluctuation, nerve is intact  Neurological: She is alert and oriented to person, place, and time. No cranial nerve deficit.  Skin: Skin is warm and dry.    ED Course  Procedures (including critical care time)  DIAGNOSTIC STUDIES: Oxygen Saturation is 96% on room air, adequate by my interpretation.    COORDINATION OF CARE: 7:33PM-Discussed 10 day Augmentin antibiotic course with patient at bedside and patient agreed to plan.  Labs  Reviewed - No data to display No results found.   1. Cat bite       MDM  Patient with puncture wound from cat bite without associated abscess.  Patient treated with augmentin and discharged with precautions and prescription for same.      .I personally performed the services described in this documentation, which was scribed in my presence. The recorded information has been reviewed and considered.    Cyndra Numbers, MD 12/02/11 [phone]

## 2011-11-28 NOTE — ED Provider Notes (Deleted)
History     CSN: [id number] Arrival date & time: 11/28/2011  6:43 PM   First MD Initiated Contact with Patient 11/28/11 1919      Chief Complaint  Patient presents with  . Animal Bite    (Consider location/radiation/quality/duration/timing/severity/associated sxs/prior treatment) HPI Patient is a 76 yo F who presents after being bitten on the dorsum of her right hand by her cat.  She has minimal pain but has had a cat bite before and knew she needed antibiotics.   Past Medical History  Diagnosis Date  . Hypertension   . Lung cancer     Past Surgical History  Procedure Date  . Lung cancer surgery     History reviewed. No pertinent family history.  History  Substance Use Topics  . Smoking status: Former Games developer  . Smokeless tobacco: Not on file  . Alcohol Use: No    OB History    Grav Para Term Preterm Abortions TAB SAB Ect Mult Living                  Review of Systems  Constitutional: Negative.   Gastrointestinal: Negative.   Musculoskeletal: Negative.   Skin: Positive for wound.  All other systems reviewed and are negative.    Allergies  Review of patient's allergies indicates no known allergies.  Home Medications   Current Outpatient Rx  Name Route Sig Dispense Refill  . ASPIRIN 81 MG PO TABS Oral Take 81 mg by mouth daily.      Marland Kitchen CALCIUM 600 + D PO Oral Take 1,800 mg by mouth daily.      Marland Kitchen LOSARTAN POTASSIUM-HCTZ 100-25 MG PO TABS  TAKE 1/2 TABLET EVERY MORNING 50 tablet 0  . THERA M PLUS PO TABS Oral Take 1 tablet by mouth daily.      Marland Kitchen NAPHAZOLINE HCL 0.012 % OP SOLN Both Eyes Place 1 drop into both eyes every morning.      Marland Kitchen OMEPRAZOLE MAGNESIUM 20 MG PO TBEC Oral Take 10 mg by mouth daily.      Marland Kitchen SIMVASTATIN 20 MG PO TABS  1/2 TAB AT BEDTIME 90 tablet 0    Time for an office visit  . AMOXICILLIN-POT CLAVULANATE 875-125 MG PO TABS Oral Take 1 tablet by mouth 2 (two) times daily. 14 tablet 0    BP 156/92  Pulse 93  Temp(Src) 97.9 F (36.6 C)  (Oral)  Resp 20  Ht 5\' 4"  (1.626 m)  Wt 150 lb (68.04 kg)  BMI 25.75 kg/m2  SpO2 96%  Physical Exam  Nursing note and vitals reviewed. Constitutional: She is oriented to person, place, and time. She appears well-developed and well-nourished. No distress.  HENT:  Head: Normocephalic and atraumatic.  Eyes: Conjunctivae and EOM are normal.  Neck: Normal range of motion.  Musculoskeletal: Normal range of motion. She exhibits no edema and no tenderness.  Neurological: She is alert and oriented to person, place, and time. No cranial nerve deficit.  Skin: Skin is warm and dry.       Puncture wound over dorsum of R hand with 1 inch in diameter surrounding swelling without fluctuance, induration, or erythema.    ED Course  Procedures (including critical care time)  Labs Reviewed - No data to display No results found.   1. Cat bite       MDM  Patient was evaluated and required only antibiotic prophylaxis with Augmentin.  She received this and was discharged in good condition with prescription and instructions  to see a doctor if her symptoms worsen despite antibiotic therapy.        Cyndra Numbers, MD 11/29/11 1610  Cyndra Numbers, MD 11/29/11 [phone]

## 2011-11-28 NOTE — ED Notes (Signed)
Pt reports she was bitten by her own cat- indooor cat with current rabies vaccine- pt has bite to right hand

## 2011-11-30 ENCOUNTER — Encounter: Payer: Self-pay | Admitting: Family Medicine

## 2011-11-30 ENCOUNTER — Telehealth: Payer: Self-pay | Admitting: Family Medicine

## 2011-11-30 ENCOUNTER — Ambulatory Visit (INDEPENDENT_AMBULATORY_CARE_PROVIDER_SITE_OTHER): Payer: Medicare Other | Admitting: Family Medicine

## 2011-11-30 DIAGNOSIS — L03113 Cellulitis of right upper limb: Secondary | ICD-10-CM | POA: Insufficient documentation

## 2011-11-30 DIAGNOSIS — L02519 Cutaneous abscess of unspecified hand: Secondary | ICD-10-CM

## 2011-11-30 NOTE — Telephone Encounter (Signed)
Pt is in route now will be here around 12.15p

## 2011-11-30 NOTE — Progress Notes (Signed)
  Subjective:    Patient ID: Frances Gonzalez, female    DOB: 1935/06/11, 76 y.o.   MRN: [id number]  HPI  j.  is a 76 year old single female Who comes in today for evaluation of a cat bite.  She states that Sunday.  Her cat bit her on the dorsum of right hand pain.  She went to an urgent care and was given Augmentin 875 mg b.i.d.  She comes in today for evaluation.  She states the redness and swelling is not any worse than it was and seems to be somewhat better.  No fever, chills   Review of Systems General and musculoskeletal review of systems otherwise negative   Objective:   Physical Exam Well-developed well-nourished, female, no acute distress.  Examination right hand shows a small puncture wound in the midline of the dorsum of the right hand.  Full range of motion.  No palmar abscess      Assessment & Plan:  Cellulitis right hand, secondary to cat bite.  Plan continue warm soaks, and Augmentin.  Follow-up on Thursday

## 2011-11-30 NOTE — Telephone Encounter (Signed)
Pt was prescribed amox tr-k clv on 11-28-[facility] [place]. Pt has cat bite now swelling/redness to hand. Pt would like ov today,

## 2011-11-30 NOTE — Patient Instructions (Signed)
Continue the warm soaks 3 times daily, and the Augmentin twice daily.  Follow-up Thursday afternoon, sooner if any problem

## 2011-12-02 ENCOUNTER — Ambulatory Visit (INDEPENDENT_AMBULATORY_CARE_PROVIDER_SITE_OTHER): Payer: Medicare Other | Admitting: Family Medicine

## 2011-12-02 ENCOUNTER — Encounter: Payer: Self-pay | Admitting: Family Medicine

## 2011-12-02 DIAGNOSIS — L02519 Cutaneous abscess of unspecified hand: Secondary | ICD-10-CM

## 2011-12-02 DIAGNOSIS — L03113 Cellulitis of right upper limb: Secondary | ICD-10-CM

## 2011-12-02 DIAGNOSIS — L03119 Cellulitis of unspecified part of limb: Secondary | ICD-10-CM

## 2011-12-02 NOTE — Progress Notes (Signed)
  Subjective:    Patient ID: Frances Gonzalez, female    DOB: [birth year], 76 y.o.   MRN: [id number]  HPI Frances Gonzalez Is a 76 year old single female, nonsmoker..... Ex-smoker...Marland KitchenMarland KitchenMarland Kitchen Who comes in today for follow-up of cellulitis of her right hand.  She had a cat bite on the dorsum of her right hand and was started on Augmentin Sunday night in the emergency room.  She came in on Tuesday of this week for follow-up.  Had a lot of swelling and erythema, but it seemed to be improving.  Therefore, we continued the Augmentin twice daily in warm soaks.  She comes in today, saying it's almost completely well.  The swelling of her hand is gone.  There is a small amount of erythema around the tooth puncture, but otherwise she feels well except she is having crampy abdominal pain, and two loose bowel movements today.   Review of Systems General and GI review of systems otherwise negative    Objective:   Physical Exam Well-developed well-nourished, and in no acute distress.  Examination of the hand shows no swelling.  Small very small amount of erythema around the tooth puncture wound, however, no pus.  No pain no tenderness       Assessment & Plan:  Cat bite, right hand, resolving.  Plan stopped the Augmentin because of the abdominal cramping and diarrhea.  Return p.r.n.

## 2011-12-02 NOTE — Patient Instructions (Signed)
Stop the Augmentin.  Return p.r.n.

## 2011-12-08 ENCOUNTER — Telehealth: Payer: Self-pay | Admitting: Oncology

## 2011-12-08 NOTE — Telephone Encounter (Signed)
Lvm advising appt 01/31/12 @ 10.30am r/s'd from 12/6 appt cx'd due to Epic.

## 2012-01-20 ENCOUNTER — Other Ambulatory Visit: Payer: Self-pay | Admitting: *Deleted

## 2012-01-20 MED ORDER — LOSARTAN POTASSIUM-HCTZ 100-25 MG PO TABS: ORAL_TABLET | ORAL | Status: DC

## 2012-01-31 ENCOUNTER — Other Ambulatory Visit: Payer: Medicare Other | Admitting: Lab

## 2012-01-31 ENCOUNTER — Telehealth: Payer: Self-pay | Admitting: Oncology

## 2012-01-31 ENCOUNTER — Encounter: Payer: Self-pay | Admitting: Oncology

## 2012-01-31 ENCOUNTER — Ambulatory Visit (HOSPITAL_BASED_OUTPATIENT_CLINIC_OR_DEPARTMENT_OTHER): Payer: Medicare Other | Admitting: Oncology

## 2012-01-31 VITALS — BP 142/83 | HR 76 | Temp 97.7°F | Ht 64.0 in | Wt 155.9 lb

## 2012-01-31 DIAGNOSIS — J984 Other disorders of lung: Secondary | ICD-10-CM

## 2012-01-31 DIAGNOSIS — Z87891 Personal history of nicotine dependence: Secondary | ICD-10-CM

## 2012-01-31 DIAGNOSIS — Z85118 Personal history of other malignant neoplasm of bronchus and lung: Secondary | ICD-10-CM

## 2012-01-31 LAB — CBC WITH DIFFERENTIAL/PLATELET
Basophils Absolute: 0 10*3/uL (ref 0.0–0.1)
Eosinophils Absolute: 0.1 10*3/uL (ref 0.0–0.5)
HCT: 36.8 % (ref 34.8–46.6)
HGB: 12.9 g/dL (ref 11.6–15.9)
LYMPH%: 19.7 % (ref 14.0–49.7)
MCV: 88.8 fL (ref 79.5–101.0)
MONO%: 10.7 % (ref 0.0–14.0)
NEUT#: 4 10*3/uL (ref 1.5–6.5)
NEUT%: 68.1 % (ref 38.4–76.8)
Platelets: 195 10*3/uL (ref 145–400)

## 2012-01-31 LAB — COMPREHENSIVE METABOLIC PANEL
CO2: 27 mEq/L (ref 19–32)
Calcium: 10.1 mg/dL (ref 8.4–10.5)
Creatinine, Ser: 0.75 mg/dL (ref 0.50–1.10)
Glucose, Bld: 90 mg/dL (ref 70–99)
Total Bilirubin: 0.5 mg/dL (ref 0.3–1.2)
Total Protein: 7.2 g/dL (ref 6.0–8.3)

## 2012-01-31 NOTE — Telephone Encounter (Signed)
Gv pt appt for [id number] 

## 2012-01-31 NOTE — Progress Notes (Signed)
CC:   Frances Gonzalez, M.D. Jeffrey A. Tawanna Cooler, MD  PROBLEM LIST: 1. Adenocarcinoma of the lung, right upper lobe 1.6 cm primary with     all 6 lymph nodes negative T1 N0, IA status post right upper     lobectomy for right upper lobe resection on 02/24/2004  currently     without evidence of recurrence. 2. Hypertension. 3. Dyslipidemia. 4. COPD. 5. Osteopenia with history of spinal compression fracture. 6. GERD. 7. History of granulomatous disease. 8. Left adrenal gland adenoma.  MEDICATIONS: 1. Aspirin 81 mg daily. 2. Calcium carbonate-vitamin D 600 mg 3 times daily. 3. Hyzaar 100-25 half a tablet daily. 4. Multivitamins 1 tab daily. 5. Prilosec 10 mg daily. 6. Zocor 10 mg at bedtime. 7. Naphazoline 0.012% ophthalmic solution 1 drop in each eye daily.  HISTORY:  I am seeing Oasis Goehring today for followup of her stage IA adenocarcinoma of the right upper lobe diagnosed in February 2005 status post right upper lobe resection on 02/24/2004.  Mrs. Pattison was last seen by [place] on 12/04/2010.  She is now 77 years old.  She continues to do well with no signs of recurrence.  She is without any respiratory symptoms.  She tells me that she had a cellulitis of her right hand in early December of 2012 due to either cat bite or scratch.  She was treated with antibiotics through the emergency room.  Aside from this, there have been no other health issues for her and she is doing well.  PHYSICAL EXAM:  She looks well.  Weight is 155 pounds 14.4 ounces. Weight is generally stable.  Height 5 feet 4 inches.  Body surface area 1.79 m square.  Blood pressure 142/83.  Other vital signs are normal. There is no scleral icterus.  Mouth and pharynx are benign.  No peripheral adenopathy palpable.  Heart and lungs are normal.  No evidence for chest wall recurrence on the right side.  Abdomen:  Benign with no organomegaly or masses palpable.  Extremities:  No peripheral edema or clubbing.  Neurologic:   Grossly normal.  LABORATORY DATA:  Today, white count 5.8, ANC 4.0 hemoglobin 12.9, hematocrit 36.8, platelets 195,000.  Chemistries today are pending. Chemistries from 12/04/2010 were normal except for sodium of 129.  IMAGING STUDIES: 1. CT scan of the chest without IV contrast on 04/09/2009 showed     stable postsurgical changes.  There was no change in the 5-6 mm     left lower lobe nodule.  There was a 1.6 x 2.0 cm left adrenal     adenoma which was stable.  There were calcified splenic     granulomata.  There were no pathologically enlarged lymph nodes. 2. Digital screening mammogram from 11/11/2009 was negative. 3. Chest x-ray, 2 view, from 04/09/2010 showed postoperative changes     of the right upper lobe without acute findings. 4. Digital bilateral screening mammogram from 12/29/2010 was negative. 5. Chest x-ray, 2 view, from 04/13/2011 showed no active disease.     Stable postoperative changes were present.  IMPRESSION AND PLAN:  Mrs. Hunnicutt continues to do well now 8 years from the time of diagnosis.  She is due for mammograms.  Patient stopped smoking 8 years ago.  She is without complaints or problems today.  I told Mrs. Quayle that we really do not need to continue following her at this point.  She suggested we go ahead and make the appointment for 1 year.  She will decide at  that time whether she will keep the appointment or whether she will cancel it.  I have put her down for CBC and chemistries.  At this point, we probably do not need to do yearly chest x-rays.    ______________________________ Samul Dada, M.D. DSM/MEDQ  D:  01/31/2012  T:  01/31/2012  Job:  [id number]

## 2012-01-31 NOTE — Progress Notes (Signed)
This office note has been dictated.    #[id number]

## 2012-04-03 ENCOUNTER — Other Ambulatory Visit: Payer: Self-pay | Admitting: Family Medicine

## 2012-04-27 ENCOUNTER — Other Ambulatory Visit: Payer: Self-pay | Admitting: Family Medicine

## 2012-04-27 DIAGNOSIS — Z1231 Encounter for screening mammogram for malignant neoplasm of breast: Secondary | ICD-10-CM

## 2012-05-18 ENCOUNTER — Ambulatory Visit
Admission: RE | Admit: 2012-05-18 | Discharge: 2012-05-18 | Disposition: A | Discharge status: Home / Self Care | Payer: Medicare Other | Source: Ambulatory Visit | Attending: Family Medicine | Admitting: Family Medicine

## 2012-05-18 DIAGNOSIS — Z1231 Encounter for screening mammogram for malignant neoplasm of breast: Secondary | ICD-10-CM

## 2012-10-09 ENCOUNTER — Other Ambulatory Visit: Payer: Self-pay | Admitting: Family Medicine

## 2013-01-12 ENCOUNTER — Other Ambulatory Visit: Payer: Self-pay | Admitting: Family Medicine

## 2013-01-16 ENCOUNTER — Encounter: Payer: Self-pay | Admitting: Family Medicine

## 2013-01-16 ENCOUNTER — Ambulatory Visit (INDEPENDENT_AMBULATORY_CARE_PROVIDER_SITE_OTHER): Payer: Medicare Other | Admitting: Family Medicine

## 2013-01-16 VITALS — BP 120/80 | Temp 98.7°F | Wt 157.0 lb

## 2013-01-16 DIAGNOSIS — Z85118 Personal history of other malignant neoplasm of bronchus and lung: Secondary | ICD-10-CM

## 2013-01-16 DIAGNOSIS — K2100 Gastro-esophageal reflux disease with esophagitis, without bleeding: Secondary | ICD-10-CM

## 2013-01-16 DIAGNOSIS — E785 Hyperlipidemia, unspecified: Secondary | ICD-10-CM

## 2013-01-16 DIAGNOSIS — I10 Essential (primary) hypertension: Secondary | ICD-10-CM

## 2013-01-16 LAB — BASIC METABOLIC PANEL
CO2: 27 mEq/L (ref 19–32)
Chloride: 100 mEq/L (ref 96–112)
Potassium: 4.9 mEq/L (ref 3.5–5.1)

## 2013-01-16 LAB — LIPID PANEL
Cholesterol: 157 mg/dL (ref 0–200)
LDL Cholesterol: 69 mg/dL (ref 0–99)
Total CHOL/HDL Ratio: 3
Triglycerides: 172 mg/dL — ABNORMAL HIGH (ref 0.0–149.0)
VLDL: 34.4 mg/dL (ref 0.0–40.0)

## 2013-01-16 LAB — TSH: TSH: 1.64 u[IU]/mL (ref 0.35–5.50)

## 2013-01-16 LAB — POCT URINALYSIS DIPSTICK
Bilirubin, UA: NEGATIVE
Ketones, UA: NEGATIVE
Leukocytes, UA: NEGATIVE
Protein, UA: NEGATIVE
Spec Grav, UA: 1.01
pH, UA: 6

## 2013-01-16 LAB — CBC WITH DIFFERENTIAL/PLATELET
Basophils Relative: 0.6 % (ref 0.0–3.0)
Eosinophils Relative: 1.3 % (ref 0.0–5.0)
HCT: 37.3 % (ref 36.0–46.0)
Lymphs Abs: 1.6 10*3/uL (ref 0.7–4.0)
MCV: 88.8 fl (ref 78.0–100.0)
Monocytes Absolute: 0.8 10*3/uL (ref 0.1–1.0)
Monocytes Relative: 10.5 % (ref 3.0–12.0)
Platelets: 245 10*3/uL (ref 150.0–400.0)
RBC: 4.2 Mil/uL (ref 3.87–5.11)
WBC: 7.7 10*3/uL (ref 4.5–10.5)

## 2013-01-16 LAB — HEPATIC FUNCTION PANEL
Albumin: 4.3 g/dL (ref 3.5–5.2)
Total Bilirubin: 0.6 mg/dL (ref 0.3–1.2)

## 2013-01-16 MED ORDER — SIMVASTATIN 20 MG PO TABS: 10.0000 mg | ORAL_TABLET | Freq: Every day | ORAL | Status: DC

## 2013-01-16 MED ORDER — OMEPRAZOLE MAGNESIUM 20 MG PO TBEC: 10.0000 mg | DELAYED_RELEASE_TABLET | Freq: Every day | ORAL | Status: DC

## 2013-01-16 MED ORDER — LOSARTAN POTASSIUM-HCTZ 100-25 MG PO TABS: ORAL_TABLET | ORAL | Status: DC

## 2013-01-16 NOTE — Patient Instructions (Signed)
Continue your current medications  Labs today  Set up a 30 minute appointment sometime in the next month or 2 for a Medicare wellness exam

## 2013-01-16 NOTE — Progress Notes (Signed)
  Subjective:    Patient ID: Frances Gonzalez, female    DOB: 1935/01/16, 78 y.o.   MRN: [id number]  HPI Frances Gonzalez is a 78 year old female nonsmoker who comes in today for followup of hypertension and reflux esophagitis  For hypertension she takes Hyzaar 100-25 dose one half tab daily BP 120/80  She started taking Prilosec 10 mg daily for symptoms of reflux esophagitis which she describes as a burning sensation in the middle of her chest that goes up to her throat. She does not smoke anymore, no alcohol, minimal caffeine, and no peppermint.  Last physical exam over a year  She was released by oncology for followup of lung cancer   Review of Systems    cardiac and GI review of systems otherwise negative Objective:   Physical Exam Well-developed well-nourished female no acute distress examination the abdomen was negative       Assessment & Plan:  Hypertension at goal continue current therapy  Reflux esophagitis continue current therapy history of lung cancer followup CPX

## 2013-01-17 ENCOUNTER — Telehealth: Payer: Self-pay | Admitting: Oncology

## 2013-01-17 NOTE — Telephone Encounter (Signed)
S/w pt re r/s 2/4 appt due to call day. Per pt cx'd 2/4 appts due to she has seen primary already and he draw the same lbs that DM draws for her so she is cancelling this appt which is just an annual f/u and will call back to r/s if she needs an appt - desk nurse aware.

## 2013-01-30 ENCOUNTER — Ambulatory Visit: Payer: Medicare Other | Admitting: Oncology

## 2013-01-30 ENCOUNTER — Other Ambulatory Visit: Payer: Medicare Other | Admitting: Lab

## 2013-03-13 ENCOUNTER — Encounter: Payer: Medicare Other | Admitting: Family Medicine

## 2013-04-24 ENCOUNTER — Ambulatory Visit (INDEPENDENT_AMBULATORY_CARE_PROVIDER_SITE_OTHER): Payer: Medicare Other | Admitting: Family Medicine

## 2013-04-24 ENCOUNTER — Encounter: Payer: Self-pay | Admitting: Family Medicine

## 2013-04-24 VITALS — BP 120/80 | Temp 98.3°F | Ht 62.75 in | Wt 153.0 lb

## 2013-04-24 DIAGNOSIS — Z23 Encounter for immunization: Secondary | ICD-10-CM

## 2013-04-24 DIAGNOSIS — L255 Unspecified contact dermatitis due to plants, except food: Secondary | ICD-10-CM

## 2013-04-24 DIAGNOSIS — M818 Other osteoporosis without current pathological fracture: Secondary | ICD-10-CM

## 2013-04-24 DIAGNOSIS — Z85118 Personal history of other malignant neoplasm of bronchus and lung: Secondary | ICD-10-CM

## 2013-04-24 DIAGNOSIS — E785 Hyperlipidemia, unspecified: Secondary | ICD-10-CM

## 2013-04-24 DIAGNOSIS — I10 Essential (primary) hypertension: Secondary | ICD-10-CM

## 2013-04-24 MED ORDER — OMEPRAZOLE MAGNESIUM 20 MG PO TBEC: DELAYED_RELEASE_TABLET | ORAL | Status: DC

## 2013-04-24 MED ORDER — LOSARTAN POTASSIUM-HCTZ 100-25 MG PO TABS: ORAL_TABLET | ORAL | Status: DC

## 2013-04-24 MED ORDER — SIMVASTATIN 20 MG PO TABS: 10.0000 mg | ORAL_TABLET | Freq: Every day | ORAL | Status: DC

## 2013-04-24 NOTE — Patient Instructions (Signed)
Continue your current medications  Followup in 1 year sooner if any problems 

## 2013-04-24 NOTE — Progress Notes (Signed)
  Subjective:    Patient ID: Frances Gonzalez, female    DOB: 1935-10-09, 78 y.o.   MRN: [id number]  HPI  Alainah is a 78 year old female nonsmoker who comes in today for a Medicare wellness examination because of a history of hypertension, reflux esophagitis, hyperlipidemia, and a history of lung cancer  She had her lung cancer removed many years ago and is clinically stable  Her medication reviewed the been no changes.  She gets routine eye care, dental care, BSE monthly, and you mammography, colonoscopy and GI normal, vaccinations up-to-date but she needs a second Pneumovax.  Cognitive function normal she walks on a daily basis home health safety reviewed no issues identified, no guns in the house, she does have a health care power of attorney and living well   Review of Systems  Constitutional: Negative.   HENT: Negative.   Eyes: Negative.   Respiratory: Negative.   Cardiovascular: Negative.   Gastrointestinal: Negative.   Genitourinary: Negative.   Musculoskeletal: Negative.   Neurological: Negative.   Psychiatric/Behavioral: Negative.        Objective:   Physical Exam  Constitutional: She appears well-developed and well-nourished.  HENT:  Head: Normocephalic and atraumatic.  Right Ear: External ear normal.  Left Ear: External ear normal.  Nose: Nose normal.  Mouth/Throat: Oropharynx is clear and moist.  Eyes: EOM are normal. Pupils are equal, round, and reactive to light.  Neck: Normal range of motion. Neck supple. No thyromegaly present.  Cardiovascular: Normal rate, regular rhythm, normal heart sounds and intact distal pulses.  Exam reveals no gallop and no friction rub.   No murmur heard. Carotids and aortic exam normal lobe. Peripheral pulses 1+ and symmetrical  Pulmonary/Chest: Effort normal and breath sounds normal.  Scar right posterior chest from previous lobectomy  Abdominal: Soft. Bowel sounds are normal. She exhibits no distension and no mass. There is no  tenderness. There is no rebound.  Genitourinary:  Bilateral breast exam normal  Pelvic examination last year normal and Pap. Every 3 years going forward unless symptomatic  Musculoskeletal: Normal range of motion.  Lymphadenopathy:    She has no cervical adenopathy.  Neurological: She is alert. She has normal reflexes. No cranial nerve deficit. She exhibits normal muscle tone. Coordination normal.  Skin: Skin is warm and dry.  Psychiatric: She has a normal mood and affect. Her behavior is normal. Judgment and thought content normal.          Assessment & Plan:  Healthy female  Hypertension at goal continue Hyzaar one half tab daily  Reflux esophagitis continue Prilosec 20 mg daily  Hyperlipidemia continue Zocor 20 mg daily and one aspirin tablet  Status post lung cancer surgery asymptomatic

## 2013-04-25 ENCOUNTER — Other Ambulatory Visit: Payer: Self-pay

## 2013-04-25 DIAGNOSIS — Z1231 Encounter for screening mammogram for malignant neoplasm of breast: Secondary | ICD-10-CM

## 2013-05-22 ENCOUNTER — Ambulatory Visit: Payer: Medicare Other

## 2013-05-30 ENCOUNTER — Ambulatory Visit
Admission: RE | Admit: 2013-05-30 | Discharge: 2013-05-30 | Disposition: A | Discharge status: Home / Self Care | Payer: Medicare Other | Source: Ambulatory Visit

## 2013-05-30 DIAGNOSIS — Z1231 Encounter for screening mammogram for malignant neoplasm of breast: Secondary | ICD-10-CM

## 2013-12-31 ENCOUNTER — Ambulatory Visit (INDEPENDENT_AMBULATORY_CARE_PROVIDER_SITE_OTHER): Payer: Medicare Other | Admitting: Family Medicine

## 2013-12-31 ENCOUNTER — Encounter: Payer: Self-pay | Admitting: Family Medicine

## 2013-12-31 ENCOUNTER — Ambulatory Visit (INDEPENDENT_AMBULATORY_CARE_PROVIDER_SITE_OTHER)
Admission: RE | Admit: 2013-12-31 | Discharge: 2013-12-31 | Disposition: A | Discharge status: Home / Self Care | Payer: Medicare Other | Source: Ambulatory Visit | Attending: Family Medicine | Admitting: Family Medicine

## 2013-12-31 VITALS — BP 110/80 | HR 79 | Temp 98.4°F | Wt 141.0 lb

## 2013-12-31 DIAGNOSIS — Z85118 Personal history of other malignant neoplasm of bronchus and lung: Secondary | ICD-10-CM

## 2013-12-31 DIAGNOSIS — R0602 Shortness of breath: Secondary | ICD-10-CM

## 2013-12-31 MED ORDER — BECLOMETHASONE DIPROPIONATE 40 MCG/ACT IN AERS: INHALATION_SPRAY | RESPIRATORY_TRACT | Status: DC

## 2013-12-31 NOTE — Patient Instructions (Signed)
Plain Zyrtec 10 mg at bedtime  Qvar 40...........Marland Kitchen begin one puff twice daily,,,,,,,, swish and spit with mouthwash after each use  We will get you set up for a pulmonary consult ASAP  Chest x-ray

## 2013-12-31 NOTE — Progress Notes (Signed)
   Subjective:    Patient ID: Frances Gonzalez, female    DOB: 1935/07/14, 79 y.o.   MRN: [id number]  HPI Frances Gonzalez is a 79 year old female X. smoker who comes in today for evaluation of shortness of breath postnasal drip for about 3 weeks  About 10 years ago she had a right upper lobectomy by Dr. Assunta Gambles for an abnormal spot on her chest x-ray. It turned out to be a malignancy. She did oncology followup for 9 years no evidence of recurrence. Over the last couple months she is no shortness of breath with minimal exertion. She's had no fever PND orthopnea. She also has allergic rhinitis for the last couple weeks with chest congestion postnasal drip.  Review of Systems    pulmonary review of systems otherwise negative Objective:   Physical Exam Well-developed well nourished female no acute distress vital signs stable she is afebrile pulse ox on room air 87%  HEENT negative neck was supple no adenopathy thyroid normal chest is symmetrical but decreased breath sounds. Cardiac exam normal       Assessment & Plan:  Allergic rhinitis treat symptomatically  COPD with hypoxia plan begin inhaled steroids pulmonary consult ASAP

## 2013-12-31 NOTE — Progress Notes (Signed)
Pre visit review using our clinic review tool, if applicable. No additional management support is needed unless otherwise documented below in the visit note. 

## 2014-01-01 ENCOUNTER — Other Ambulatory Visit: Payer: Self-pay | Admitting: Family Medicine

## 2014-01-01 DIAGNOSIS — R05 Cough: Secondary | ICD-10-CM

## 2014-01-01 DIAGNOSIS — Z85118 Personal history of other malignant neoplasm of bronchus and lung: Secondary | ICD-10-CM

## 2014-01-01 DIAGNOSIS — R059 Cough, unspecified: Secondary | ICD-10-CM

## 2014-01-10 ENCOUNTER — Ambulatory Visit (INDEPENDENT_AMBULATORY_CARE_PROVIDER_SITE_OTHER): Payer: Medicare Other | Admitting: Internal Medicine

## 2014-01-10 ENCOUNTER — Encounter: Payer: Self-pay | Admitting: Internal Medicine

## 2014-01-10 VITALS — BP 104/60 | HR 98 | Temp 97.6°F | Ht 64.0 in | Wt 140.0 lb

## 2014-01-10 DIAGNOSIS — R05 Cough: Secondary | ICD-10-CM

## 2014-01-10 DIAGNOSIS — R058 Other specified cough: Secondary | ICD-10-CM

## 2014-01-10 DIAGNOSIS — I10 Essential (primary) hypertension: Secondary | ICD-10-CM

## 2014-01-10 DIAGNOSIS — R059 Cough, unspecified: Secondary | ICD-10-CM

## 2014-01-10 MED ORDER — IRBESARTAN 150 MG PO TABS: ORAL_TABLET | ORAL | Status: DC

## 2014-01-10 NOTE — Progress Notes (Signed)
   Subjective:    Patient ID: Frances Gonzalez, female    DOB: 08-11-1935  MRN: [id number]  HPI  20 yowf quit smoking 2005 s/p RULobectomy by Arlyce Dice with no breathing problem at that point referred 01/10/2014 by Dr Sherren Mocha for eval of sob    01/10/2014 1st [facility] office visit/ Wert cc acute onset sob/sense of chest chestion in mid Dec 2014 assoc with runny nose and initially feverish rx and no better since then  on qvar bid and zyrtec.  No more fever. Never purulent secretions but almost constant daytime sensation of throat and chest "congestion"  Nose dripping p heat comes on in am x years, doe when walks and talks at same time or "gets in a real hurry".  No obvious day to day or daytime variabilty or assoc cp or chest tightness, subjective wheeze overt sinus or hb symptoms. No unusual exp hx or h/o childhood pna/ asthma or knowledge of premature birth.  Sleeping ok without nocturnal  or early am exacerbation  of respiratory  c/o's or need for noct saba. Also denies any obvious fluctuation of symptoms with weather or environmental changes or other aggravating or alleviating factors except as outlined above   Current Medications, Allergies, Complete Past Medical History, Past Surgical History, Family History, and Social History were reviewed in [employer] record.       Review of Systems  Constitutional: Negative for fever, chills and unexpected weight change.  HENT: Positive for dental problem. Negative for congestion, ear pain, nosebleeds, postnasal drip, rhinorrhea, sinus pressure, sneezing, sore throat, trouble swallowing and voice change.   Eyes: Negative for visual disturbance.  Respiratory: Positive for cough and shortness of breath. Negative for choking.   Cardiovascular: Negative for chest pain and leg swelling.  Gastrointestinal: Negative for vomiting, abdominal pain and diarrhea.  Genitourinary: Negative for difficulty urinating.  Musculoskeletal:  Negative for arthralgias.  Skin: Negative for rash.  Neurological: Negative for tremors, syncope and headaches.  Hematological: Does not bruise/bleed easily.       Objective:   Physical Exam   amb wf nad  Wt Readings from Last 3 Encounters:  01/10/14 140 lb (63.504 kg)  12/31/13 141 lb (63.957 kg)  04/24/13 153 lb (69.4 kg)      HEENT: nl dentition, turbinates, and orophanx. Nl external ear canals without cough reflex   NECK :  without JVD/Nodes/TM/ nl carotid upstrokes bilaterally   LUNGS: no acc muscle use, clear to A and P bilaterally without cough on insp or exp maneuvers   CV:  RRR  no s3 or murmur or increase in P2, no edema   ABD:  soft and nontender with nl excursion in the supine position. No bruits or organomegaly, bowel sounds nl  MS:  warm without deformities, calf tenderness, cyanosis or clubbing  SKIN: warm and dry without lesions    NEURO:  alert, approp, no deficits      cxr 12/31/13  COPD and scarring. No acute abnormality and no evidence of recurrent  neoplasm      Assessment & Plan:

## 2014-01-10 NOTE — Patient Instructions (Addendum)
Stop losartan and qvar  Start avapro 150 one half daily  Continue prilosec but take it  Take 30-60 min before first meal of the day  GERD (REFLUX)  is an extremely common cause of respiratory symptoms, many times with no significant heartburn at all.    It can be treated with medication, but also with lifestyle changes including avoidance of late meals, excessive alcohol, smoking cessation, and avoid fatty foods, chocolate, peppermint, colas, red wine, and acidic juices such as orange juice.  NO MINT OR MENTHOL PRODUCTS SO NO COUGH DROPS  USE SUGARLESS CANDY INSTEAD (jolley ranchers or Stover's)  NO OIL BASED VITAMINS - use powdered substitutes.    Please schedule a follow up office visit in 4 weeks, sooner if needed with pfts

## 2014-01-11 DIAGNOSIS — R058 Other specified cough: Secondary | ICD-10-CM | POA: Insufficient documentation

## 2014-01-11 DIAGNOSIS — R05 Cough: Secondary | ICD-10-CM | POA: Insufficient documentation

## 2014-01-12 NOTE — Assessment & Plan Note (Signed)
For reasons that may related to vascular permability and nitric oxide pathways but not elevated  bradykinin levels (as seen with  ACEi use) losartan in the generic form has been reported now from mulitple sources  to cause a similar pattern of non-specific  upper airway symptoms as seen with acei.   This has not been reported with exposure to the other ARB's to date, so it seems reasonable for now to try either generic diovan or avapro if ARB needed or use an alternative class altogether.  See:  Lelon Frohlich Allergy Asthma Immunol  2008: 101: p [phone]    Try avapro 150 one half daily x one month trial

## 2014-01-12 NOTE — Assessment & Plan Note (Addendum)
         Upper airway cough syndrome, so named because it's frequently impossible to sort out how much is  CR/sinusitis with freq throat clearing (which can be related to primary GERD)   vs  causing  secondary (" extra esophageal")  GERD from wide swings in gastric pressure that occur with throat clearing, often  promoting self use of mint and menthol lozenges that reduce the lower esophageal sphincter tone and exacerbate the problem further in a cyclical fashion.   These are the same pts (now being labeled as having "irritable larynx syndrome" by some cough centers) who not infrequently have a history of having failed to tolerate ace inhibitors(and now sometimes reported in generic cozar),  dry powder inhalers or biphosphonates or report having atypical reflux symptoms that don't respond to standard doses of PPI , and are easily confused as having aecopd or asthma flares by even experienced allergists/ pulmonologists.   Will try max rx for gerd and try off cozaar and recheck pfts off qvar

## 2014-01-24 ENCOUNTER — Telehealth: Payer: Self-pay | Admitting: Internal Medicine

## 2014-01-24 NOTE — Telephone Encounter (Signed)
Pt advised to increase avapro to whole tablet a day.

## 2014-01-24 NOTE — Telephone Encounter (Signed)
Called and spoke with pt. She saw MW 01/10/14 and BP meds were changed. Pt reports here lately her BP has been a little higher. BP 165/85 x 4 days. She currently takes avapro 150 mg 1/2 daily. Please advised MW any recs? Thanks  No Known Allergies

## 2014-01-24 NOTE — Telephone Encounter (Signed)
Increase to a whole pill daily

## 2014-01-28 ENCOUNTER — Other Ambulatory Visit: Payer: Self-pay | Admitting: Family Medicine

## 2014-02-11 ENCOUNTER — Telehealth: Payer: Self-pay | Admitting: Internal Medicine

## 2014-02-11 NOTE — Telephone Encounter (Signed)
lmtcb x1 

## 2014-02-12 ENCOUNTER — Ambulatory Visit: Payer: Medicare Other | Admitting: Internal Medicine

## 2014-02-13 ENCOUNTER — Telehealth: Payer: Self-pay | Admitting: Internal Medicine

## 2014-02-13 MED ORDER — IRBESARTAN 150 MG PO TABS: ORAL_TABLET | ORAL | Status: DC

## 2014-02-13 MED ORDER — IRBESARTAN 150 MG PO TABS: 150.0000 mg | ORAL_TABLET | Freq: Every day | ORAL | Status: DC

## 2014-02-13 NOTE — Telephone Encounter (Signed)
Patient returning call.  Patient is down to last pill.

## 2014-02-13 NOTE — Telephone Encounter (Signed)
Rx was refilled  Pt aware  Nothing further needed 

## 2014-02-13 NOTE — Telephone Encounter (Signed)
Rx was sent in wrong. Her dosage was to be increased to one tablet daily. This has been fixed. Nothing further was needed.

## 2014-03-13 ENCOUNTER — Ambulatory Visit: Payer: Medicare Other | Admitting: Internal Medicine

## 2014-04-02 ENCOUNTER — Other Ambulatory Visit: Payer: Self-pay | Admitting: Internal Medicine

## 2014-04-02 DIAGNOSIS — R05 Cough: Secondary | ICD-10-CM

## 2014-04-02 DIAGNOSIS — R059 Cough, unspecified: Secondary | ICD-10-CM

## 2014-04-03 ENCOUNTER — Ambulatory Visit: Payer: Medicare Other | Admitting: Internal Medicine

## 2014-04-03 ENCOUNTER — Ambulatory Visit (INDEPENDENT_AMBULATORY_CARE_PROVIDER_SITE_OTHER): Payer: Medicare Other | Admitting: Internal Medicine

## 2014-04-03 DIAGNOSIS — R05 Cough: Secondary | ICD-10-CM

## 2014-04-03 DIAGNOSIS — R059 Cough, unspecified: Secondary | ICD-10-CM

## 2014-04-03 LAB — PULMONARY FUNCTION TEST
DL/VA % pred: 63 %
DL/VA: 2.82 ml/min/mmHg/L
DLCO unc % pred: 48 %
DLCO unc: 10.13 ml/min/mmHg
FEF 25-75 PRE: 1.16 L/s
FEF 25-75 Post: 1.36 L/sec
FEF2575-%CHANGE-POST: 18 %
FEF2575-%Pred-Post: 94 %
FEF2575-%Pred-Pre: 80 %
FEV1-%CHANGE-POST: 3 %
FEV1-%Pred-Post: 94 %
FEV1-%Pred-Pre: 91 %
FEV1-Post: 1.74 L
FEV1-Pre: 1.68 L
FEV1FVC-%Change-Post: 0 %
FEV1FVC-%Pred-Pre: 97 %
FEV6-%Change-Post: 2 %
FEV6-%Pred-Post: 100 %
FEV6-%Pred-Pre: 98 %
FEV6-POST: 2.34 L
FEV6-PRE: 2.29 L
FEV6FVC-%CHANGE-POST: 0 %
FEV6FVC-%PRED-PRE: 104 %
FEV6FVC-%Pred-Post: 104 %
FVC-%Change-Post: 2 %
FVC-%PRED-PRE: 94 %
FVC-%Pred-Post: 96 %
FVC-POST: 2.36 L
FVC-Pre: 2.31 L
POST FEV6/FVC RATIO: 99 %
Post FEV1/FVC ratio: 73 %
Pre FEV1/FVC ratio: 73 %
Pre FEV6/FVC Ratio: 99 %
RV % pred: 64 %
RV: 1.41 L
TLC % PRED: 83 %
TLC: 3.9 L

## 2014-04-03 NOTE — Progress Notes (Signed)
PFT done today. 

## 2014-04-05 ENCOUNTER — Other Ambulatory Visit (INDEPENDENT_AMBULATORY_CARE_PROVIDER_SITE_OTHER): Payer: 59

## 2014-04-05 ENCOUNTER — Ambulatory Visit (INDEPENDENT_AMBULATORY_CARE_PROVIDER_SITE_OTHER): Payer: Medicare Other | Admitting: Internal Medicine

## 2014-04-05 ENCOUNTER — Encounter: Payer: Self-pay | Admitting: Internal Medicine

## 2014-04-05 VITALS — BP 116/74 | HR 91 | Temp 98.4°F | Ht 61.5 in | Wt 140.2 lb

## 2014-04-05 DIAGNOSIS — E785 Hyperlipidemia, unspecified: Secondary | ICD-10-CM

## 2014-04-05 DIAGNOSIS — R058 Other specified cough: Secondary | ICD-10-CM

## 2014-04-05 DIAGNOSIS — R05 Cough: Secondary | ICD-10-CM

## 2014-04-05 DIAGNOSIS — R0602 Shortness of breath: Secondary | ICD-10-CM

## 2014-04-05 DIAGNOSIS — I10 Essential (primary) hypertension: Secondary | ICD-10-CM

## 2014-04-05 DIAGNOSIS — R059 Cough, unspecified: Secondary | ICD-10-CM

## 2014-04-05 LAB — CBC WITH DIFFERENTIAL/PLATELET
BASOS ABS: 0 10*3/uL (ref 0.0–0.1)
Basophils Relative: 0.7 % (ref 0.0–3.0)
EOS ABS: 0.2 10*3/uL (ref 0.0–0.7)
Eosinophils Relative: 2.6 % (ref 0.0–5.0)
HCT: 36.5 % (ref 36.0–46.0)
HEMOGLOBIN: 12.6 g/dL (ref 12.0–15.0)
Lymphocytes Relative: 23.4 % (ref 12.0–46.0)
Lymphs Abs: 1.4 10*3/uL (ref 0.7–4.0)
MCHC: 34.6 g/dL (ref 30.0–36.0)
MCV: 86.8 fl (ref 78.0–100.0)
MONOS PCT: 13.8 % — AB (ref 3.0–12.0)
Monocytes Absolute: 0.8 10*3/uL (ref 0.1–1.0)
NEUTROS PCT: 59.5 % (ref 43.0–77.0)
Neutro Abs: 3.5 10*3/uL (ref 1.4–7.7)
PLATELETS: 245 10*3/uL (ref 150.0–400.0)
RBC: 4.21 Mil/uL (ref 3.87–5.11)
RDW: 14.2 % (ref 11.5–14.6)
WBC: 5.9 10*3/uL (ref 4.5–10.5)

## 2014-04-05 LAB — BASIC METABOLIC PANEL
BUN: 11 mg/dL (ref 6–23)
CHLORIDE: 98 meq/L (ref 96–112)
CO2: 26 mEq/L (ref 19–32)
Calcium: 9.6 mg/dL (ref 8.4–10.5)
Creatinine, Ser: 0.6 mg/dL (ref 0.4–1.2)
GFR: 97.52 mL/min (ref >60.00)
Glucose, Bld: 95 mg/dL (ref 70–99)
POTASSIUM: 4.4 meq/L (ref 3.5–5.1)
SODIUM: 132 meq/L — AB (ref 135–145)

## 2014-04-05 LAB — TSH: TSH: 2.21 u[IU]/mL (ref 0.35–5.50)

## 2014-04-05 LAB — BRAIN NATRIURETIC PEPTIDE: PRO B NATRI PEPTIDE: 39 pg/mL (ref 0.0–100.0)

## 2014-04-05 MED ORDER — PANTOPRAZOLE SODIUM 40 MG PO TBEC: 40.0000 mg | DELAYED_RELEASE_TABLET | Freq: Every day | ORAL | Status: DC

## 2014-04-05 NOTE — Patient Instructions (Addendum)
Take delsym two tsp every 12 hours  to suppress the urge to cough. Swallowing water or using ice chips/non mint and menthol containing hard candies (such as lifesavers or sugarless jolly ranchers) are also effective.  You should rest your voice and avoid activities that you know make you cough.  Pantoprazole (protonix) 40 mg Take 30-60 min before first meal of the day  Please remember to go to the lab department downstairs for your tests - we will call you with the results when they are available. Late add: needs CTa chest and sinus CT  to complete w/u for unexplained doe

## 2014-04-05 NOTE — Progress Notes (Signed)
Subjective:    Patient ID: Frances Gonzalez, female    DOB: 1935/06/22  MRN: [id number]   Brief patient profile:  79 yowf quit smoking 2005 s/p RULobectomy by Arlyce Dice with no breathing problem at that point referred 01/10/2014 by Dr Sherren Mocha for eval of sob    History of Present Illness  01/10/2014 1st [facility] office visit/ Frances Gonzalez cc acute onset sob/sense of chest chestion in mid Dec 2014 assoc with runny nose and initially feverish rx and no better since then  on qvar bid and zyrtec.  No more fever. Never purulent secretions but almost constant daytime sensation of throat and chest "congestion" x 10 years (ever since post op)  Nose dripping p heat comes on in am x years, doe when walks and talks at same time or "gets in a real hurry". rec Stop losartan and qvar  Start avapro 150 one half daily  Continue prilosec but take it  Take 30-60 min before first meal of the day GERD diet    04/05/2014 f/u ov/[name] re:  Doe if walking more than regular pace or carrying more than 10 lb asso with freq throat clearing and breathing worse with bending over despite rx with prilosec 20 mg ac daily  Chief Complaint  Patient presents with  . Follow-up    PFT done 04/03/14. She reports her breathing is unchanged since last cisit. No new co's today.   no worse off all inhalers but no better either, still almost constant daytime urge to clear her throat but no excess mucus, day >> night,  Worse with voice use  No obvious day to day or daytime variabilty or assoc   cp or chest tightness, subjective wheeze overt sinus or hb symptoms. No unusual exp hx or h/o childhood pna/ asthma or knowledge of premature birth.  Sleeping ok without nocturnal  or early am exacerbation  of respiratory  c/o's or need for noct saba. Also denies any obvious fluctuation of symptoms with weather or environmental changes or other aggravating or alleviating factors except as outlined above   Current Medications, Allergies, Complete Past  Medical History, Past Surgical History, Family History, and Social History were reviewed in [employer] record.  ROS  The following are not active complaints unless bolded sore throat, dysphagia, dental problems, itching, sneezing,  nasal congestion or excess/ purulent secretions, ear ache,   fever, chills, sweats, unintended wt loss, pleuritic or exertional cp, hemoptysis,  orthopnea pnd or leg swelling, presyncope, palpitations, heartburn, abdominal pain, anorexia, nausea, vomiting, diarrhea  or change in bowel or urinary habits, change in stools or urine, dysuria,hematuria,  rash, arthralgias, visual complaints, headache, numbness weakness or ataxia or problems with walking or coordination,  change in mood/affect or memory.         Objective:   Physical Exam   amb wf nad freq throat clearing   04/05/2014       140  Wt Readings from Last 3 Encounters:  01/10/14 140 lb (63.504 kg)  12/31/13 141 lb (63.957 kg)  04/24/13 153 lb (69.4 kg)      HEENT: nl dentition, turbinates, and orophanx. Nl external ear canals without cough reflex   NECK :  without JVD/Nodes/TM/ nl carotid upstrokes bilaterally   LUNGS: no acc muscle use, clear to A and P bilaterally without cough on insp or exp maneuvers   CV:  RRR  no s3 or murmur or increase in P2, no edema   ABD:  soft and nontender  with nl excursion in the supine position. No bruits or organomegaly, bowel sounds nl  MS:  warm without deformities, calf tenderness, cyanosis or clubbing  SKIN: warm and dry without lesions    NEURO:  alert, approp, no deficits      cxr 12/31/13  COPD and scarring. No acute abnormality and no evidence of recurrent  neoplasm      Recent Labs Lab 04/05/14 1705  NA 132*  K 4.4  CL 98  CO2 26  BUN 11  CREATININE 0.6  GLUCOSE 95    Recent Labs Lab 04/05/14 1705  HGB 12.6  HCT 36.5  WBC 5.9  PLT 245.0      Lab Results  Component Value Date   PROBNP 39.0 04/05/2014      . Lab Results  Component Value Date   TSH 2.21 04/05/2014      Assessment & Plan:

## 2014-04-06 NOTE — Assessment & Plan Note (Signed)
-   04/03/14  PFTs  FEV1  1.68 (91%) with ratio 73 and no better p B2,  DLCO 48 corrects to 63% - 04/05/2014  Walked RA x 3 laps @ 185 ft each stopped due to end of study sats 90%   No explanation for sob except for slt decrease in DLCO s desats so ? Significant > needs CTa to be complete - in meantime try to control throat clearing to see if this helps her senses of doe (see upper airway cough syndrome a/p)

## 2014-04-06 NOTE — Assessment & Plan Note (Addendum)
Try off cozar and on avapro 01/10/14 due to cough> no change as of 04/05/14  rec leave off generic cozar for now and continue avapro generic which has not been reported to cause cough and is working well

## 2014-04-06 NOTE — Assessment & Plan Note (Addendum)
-   trial off cozar and qvar 01/11/2014 > no better / no worse  Ok to leave off both for now and w/u for occult allergy/ max rx for GERD/ chronic rhinitis/sinusitis (sinus CT pending) and if not effective consider trial of neurontin next noting that this symptoms dates all the way back to post op setting.

## 2014-04-08 ENCOUNTER — Encounter: Payer: Self-pay | Admitting: Internal Medicine

## 2014-04-08 LAB — ALLERGY PROFILE REGION II-DC, DE, MD, ~~LOC~~, VA
Allergen, D pternoyssinus,d7: <0.1 kU/L
Alternaria Alternata: <0.1 kU/L
Bermuda Grass: <0.1 kU/L
Box Elder IgE: <0.1 kU/L
Cat Dander: <0.1 kU/L
Cladosporium Herbarum: <0.1 kU/L
Cockroach: <0.1 kU/L
Common Ragweed: <0.1 kU/L
D. farinae: <0.1 kU/L
Dog Dander: <0.1 kU/L
Elm IgE: <0.1 kU/L
Lamb's Quarters: <0.1 kU/L
Oak: <0.1 kU/L

## 2014-04-09 ENCOUNTER — Other Ambulatory Visit: Payer: Self-pay | Admitting: Internal Medicine

## 2014-04-09 ENCOUNTER — Encounter: Payer: Self-pay | Admitting: Internal Medicine

## 2014-04-09 ENCOUNTER — Ambulatory Visit (INDEPENDENT_AMBULATORY_CARE_PROVIDER_SITE_OTHER)
Admission: RE | Admit: 2014-04-09 | Discharge: 2014-04-09 | Disposition: A | Discharge status: Home / Self Care | Payer: Medicare Other | Source: Ambulatory Visit | Attending: Internal Medicine | Admitting: Internal Medicine

## 2014-04-09 DIAGNOSIS — R0602 Shortness of breath: Secondary | ICD-10-CM

## 2014-04-09 DIAGNOSIS — J9859 Other diseases of mediastinum, not elsewhere classified: Secondary | ICD-10-CM

## 2014-04-09 DIAGNOSIS — R058 Other specified cough: Secondary | ICD-10-CM

## 2014-04-09 DIAGNOSIS — R059 Cough, unspecified: Secondary | ICD-10-CM

## 2014-04-09 DIAGNOSIS — R05 Cough: Secondary | ICD-10-CM

## 2014-04-09 MED ORDER — IOHEXOL 350 MG/ML SOLN
80.0000 mL | Freq: Once | INTRAVENOUS | Status: AC | PRN
  Administered 2014-04-09: 80 mL via INTRAVENOUS

## 2014-04-10 NOTE — Progress Notes (Signed)
Quick Note:  Spoke with pt and notified of results per Dr. Wert. Pt verbalized understanding and denied any questions.  ______ 

## 2014-04-17 ENCOUNTER — Encounter (HOSPITAL_COMMUNITY): Payer: Self-pay

## 2014-04-17 ENCOUNTER — Other Ambulatory Visit: Payer: Self-pay | Admitting: Internal Medicine

## 2014-04-17 ENCOUNTER — Encounter: Payer: Self-pay | Admitting: Internal Medicine

## 2014-04-17 ENCOUNTER — Ambulatory Visit (HOSPITAL_COMMUNITY)
Admission: RE | Admit: 2014-04-17 | Discharge: 2014-04-17 | Disposition: A | Discharge status: Home / Self Care | Payer: Medicare Other | Source: Ambulatory Visit | Attending: Internal Medicine | Admitting: Internal Medicine

## 2014-04-17 DIAGNOSIS — R599 Enlarged lymph nodes, unspecified: Secondary | ICD-10-CM | POA: Diagnosis not present

## 2014-04-17 DIAGNOSIS — Z902 Acquired absence of lung [part of]: Secondary | ICD-10-CM | POA: Diagnosis not present

## 2014-04-17 DIAGNOSIS — C3490 Malignant neoplasm of unspecified part of unspecified bronchus or lung: Secondary | ICD-10-CM | POA: Diagnosis present

## 2014-04-17 DIAGNOSIS — J9859 Other diseases of mediastinum, not elsewhere classified: Secondary | ICD-10-CM

## 2014-04-17 DIAGNOSIS — I2584 Coronary atherosclerosis due to calcified coronary lesion: Secondary | ICD-10-CM | POA: Insufficient documentation

## 2014-04-17 DIAGNOSIS — D3500 Benign neoplasm of unspecified adrenal gland: Secondary | ICD-10-CM | POA: Diagnosis not present

## 2014-04-17 DIAGNOSIS — I319 Disease of pericardium, unspecified: Secondary | ICD-10-CM | POA: Insufficient documentation

## 2014-04-17 DIAGNOSIS — R0602 Shortness of breath: Secondary | ICD-10-CM | POA: Diagnosis not present

## 2014-04-17 LAB — GLUCOSE, CAPILLARY: GLUCOSE-CAPILLARY: 108 mg/dL — AB (ref 70–99)

## 2014-04-17 MED ORDER — FLUDEOXYGLUCOSE F - 18 (FDG) INJECTION
9.6000 | Freq: Once | INTRAVENOUS | Status: AC | PRN
  Administered 2014-04-17: 9.6 via INTRAVENOUS

## 2014-04-18 ENCOUNTER — Other Ambulatory Visit: Payer: Self-pay | Admitting: Radiology

## 2014-04-18 ENCOUNTER — Other Ambulatory Visit: Payer: Self-pay | Admitting: Internal Medicine

## 2014-04-18 DIAGNOSIS — J9859 Other diseases of mediastinum, not elsewhere classified: Secondary | ICD-10-CM

## 2014-04-22 ENCOUNTER — Encounter (HOSPITAL_COMMUNITY): Payer: Self-pay | Admitting: Pharmacy Technician

## 2014-04-23 ENCOUNTER — Encounter: Payer: Self-pay | Admitting: Internal Medicine

## 2014-04-23 ENCOUNTER — Ambulatory Visit (HOSPITAL_COMMUNITY)
Admission: RE | Admit: 2014-04-23 | Discharge: 2014-04-23 | Disposition: A | Discharge status: Home / Self Care | Payer: Medicare Other | Source: Ambulatory Visit | Attending: Internal Medicine | Admitting: Internal Medicine

## 2014-04-23 DIAGNOSIS — J9859 Other diseases of mediastinum, not elsewhere classified: Secondary | ICD-10-CM

## 2014-04-23 DIAGNOSIS — R599 Enlarged lymph nodes, unspecified: Secondary | ICD-10-CM | POA: Diagnosis present

## 2014-04-23 NOTE — Procedures (Signed)
R Supra Clav LN Bx No comp

## 2014-04-25 ENCOUNTER — Telehealth: Payer: Self-pay | Admitting: Internal Medicine

## 2014-04-25 NOTE — Telephone Encounter (Signed)
I spoke with the Frances Gonzalez  She states that she thinks that Dr Melvyn Novas called her today but she was not able to answer She is requesting biopsy results  Please advise, thanks!

## 2014-04-25 NOTE — Telephone Encounter (Signed)
Let her know the bx is not back yet and it may be tomorrow before we know anything but I will be checking my compouter intermittently in the meantime and as soon as it's finalized I will call her.

## 2014-04-25 NOTE — Telephone Encounter (Signed)
I called and made pt aware. Nothing further needed 

## 2014-04-26 ENCOUNTER — Encounter: Payer: Self-pay | Admitting: Internal Medicine

## 2014-04-26 ENCOUNTER — Other Ambulatory Visit: Payer: Self-pay | Admitting: Internal Medicine

## 2014-04-26 ENCOUNTER — Telehealth: Payer: Self-pay | Admitting: Internal Medicine

## 2014-04-26 DIAGNOSIS — J9859 Other diseases of mediastinum, not elsewhere classified: Secondary | ICD-10-CM

## 2014-04-26 NOTE — Telephone Encounter (Signed)
S/W PATIENT AND GAVE NP APPT FOR 05/05 @1 :30 W/DR. MOHAMED.  REFERRING DR. Christinia Gully DX- MEDIASTINAL MASS

## 2014-04-29 ENCOUNTER — Other Ambulatory Visit: Payer: Self-pay | Admitting: *Deleted

## 2014-04-29 DIAGNOSIS — Z85118 Personal history of other malignant neoplasm of bronchus and lung: Secondary | ICD-10-CM

## 2014-04-30 ENCOUNTER — Encounter: Payer: Self-pay | Admitting: Internal Medicine

## 2014-04-30 ENCOUNTER — Ambulatory Visit (HOSPITAL_BASED_OUTPATIENT_CLINIC_OR_DEPARTMENT_OTHER): Payer: 59 | Admitting: Internal Medicine

## 2014-04-30 ENCOUNTER — Other Ambulatory Visit: Payer: Self-pay | Admitting: Radiology

## 2014-04-30 ENCOUNTER — Telehealth: Payer: Self-pay | Admitting: Internal Medicine

## 2014-04-30 ENCOUNTER — Ambulatory Visit (HOSPITAL_BASED_OUTPATIENT_CLINIC_OR_DEPARTMENT_OTHER): Payer: Medicare Other

## 2014-04-30 ENCOUNTER — Other Ambulatory Visit (HOSPITAL_BASED_OUTPATIENT_CLINIC_OR_DEPARTMENT_OTHER): Payer: 59

## 2014-04-30 ENCOUNTER — Other Ambulatory Visit: Payer: Self-pay | Admitting: Internal Medicine

## 2014-04-30 VITALS — BP 149/72 | HR 100 | Temp 98.5°F | Resp 17 | Ht 61.0 in | Wt 136.5 lb

## 2014-04-30 DIAGNOSIS — Z85118 Personal history of other malignant neoplasm of bronchus and lung: Secondary | ICD-10-CM

## 2014-04-30 DIAGNOSIS — R918 Other nonspecific abnormal finding of lung field: Secondary | ICD-10-CM

## 2014-04-30 DIAGNOSIS — J449 Chronic obstructive pulmonary disease, unspecified: Secondary | ICD-10-CM

## 2014-04-30 DIAGNOSIS — C3490 Malignant neoplasm of unspecified part of unspecified bronchus or lung: Secondary | ICD-10-CM

## 2014-04-30 DIAGNOSIS — C342 Malignant neoplasm of middle lobe, bronchus or lung: Secondary | ICD-10-CM | POA: Insufficient documentation

## 2014-04-30 DIAGNOSIS — J9859 Other diseases of mediastinum, not elsewhere classified: Secondary | ICD-10-CM

## 2014-04-30 DIAGNOSIS — R599 Enlarged lymph nodes, unspecified: Secondary | ICD-10-CM

## 2014-04-30 DIAGNOSIS — C7A098 Malignant carcinoid tumors of other sites: Secondary | ICD-10-CM

## 2014-04-30 DIAGNOSIS — Z800 Family history of malignant neoplasm of digestive organs: Secondary | ICD-10-CM

## 2014-04-30 DIAGNOSIS — C7A1 Malignant poorly differentiated neuroendocrine tumors: Secondary | ICD-10-CM

## 2014-04-30 DIAGNOSIS — Z87891 Personal history of nicotine dependence: Secondary | ICD-10-CM

## 2014-04-30 LAB — COMPREHENSIVE METABOLIC PANEL (CC13)
ALT: 6 U/L (ref 0–55)
AST: 17 U/L (ref 5–34)
Albumin: 3.8 g/dL (ref 3.5–5.0)
Alkaline Phosphatase: 88 U/L (ref 40–150)
Anion Gap: 10 mEq/L (ref 3–11)
BUN: 11.3 mg/dL (ref 7.0–26.0)
CO2: 22 mEq/L (ref 22–29)
CREATININE: 0.8 mg/dL (ref 0.6–1.1)
Calcium: 9.5 mg/dL (ref 8.4–10.4)
Chloride: 101 mEq/L (ref 98–109)
Glucose: 105 mg/dl (ref 70–140)
Potassium: 4.1 mEq/L (ref 3.5–5.1)
SODIUM: 132 meq/L — AB (ref 136–145)
TOTAL PROTEIN: 7.6 g/dL (ref 6.4–8.3)
Total Bilirubin: 0.71 mg/dL (ref 0.20–1.20)

## 2014-04-30 LAB — CBC WITH DIFFERENTIAL/PLATELET
BASO%: 0.7 % (ref 0.0–2.0)
Basophils Absolute: 0 10*3/uL (ref 0.0–0.1)
EOS%: 0.8 % (ref 0.0–7.0)
Eosinophils Absolute: 0.1 10*3/uL (ref 0.0–0.5)
HCT: 36.4 % (ref 34.8–46.6)
HGB: 12.5 g/dL (ref 11.6–15.9)
LYMPH%: 13.9 % — ABNORMAL LOW (ref 14.0–49.7)
MCH: 30.1 pg (ref 25.1–34.0)
MCHC: 34.4 g/dL (ref 31.5–36.0)
MCV: 87.5 fL (ref 79.5–101.0)
MONO#: 0.8 10*3/uL (ref 0.1–0.9)
MONO%: 11.4 % (ref 0.0–14.0)
NEUT#: 5.4 10*3/uL (ref 1.5–6.5)
NEUT%: 73.2 % (ref 38.4–76.8)
Platelets: 243 10*3/uL (ref 145–400)
RBC: 4.17 10*6/uL (ref 3.70–5.45)
RDW: 14 % (ref 11.2–14.5)
WBC: 7.3 10*3/uL (ref 3.9–10.3)
lymph#: 1 10*3/uL (ref 0.9–3.3)

## 2014-04-30 MED ORDER — LIDOCAINE-PRILOCAINE 2.5-2.5 % EX CREA: 1.0000 "application " | TOPICAL_CREAM | CUTANEOUS | Status: DC | PRN

## 2014-04-30 MED ORDER — PROCHLORPERAZINE MALEATE 10 MG PO TABS: 10.0000 mg | ORAL_TABLET | Freq: Four times a day (QID) | ORAL | Status: DC | PRN

## 2014-04-30 NOTE — Progress Notes (Signed)
Checked in new patient and no issues at this time----she has not seen dr so no plan of care at this time. [place]

## 2014-04-30 NOTE — Telephone Encounter (Signed)
per pof to make appts/sch MRI/IR-sent MW a email for chemo sch-adv pt I would mail sch out once chemo is sch

## 2014-04-30 NOTE — Progress Notes (Signed)
[place] [phone]   [phone]) [phone]  CONSULT NOTE  REFERRING PHYSICIAN: Dr. Christinia Gully  REASON FOR CONSULTATION:  79 years old white female recently diagnosed with lung cancer  HPI Frances Gonzalez is a 79 y.o. female with a past medical history significant for hypertension, dyslipidemia, osteoporosis, GERD as well as history of stage IA non-small cell lung cancer, adenocarcinoma diagnosed 10 years ago status post right upper lobectomy on 02/24/2004 under the care of Dr. Arlyce Dice. The patient also has a long history of smoking but quit 10 years ago. She has been doing fine until middle of December 2014 when she started having worsening dyspnea and chest congestion. She was seen by her primary care physician at that time and chest x-ray performed in 01/01/2014 was unremarkable except for COPD. The patient was referred to Dr. Melvyn Novas. He is scheduled for pulmonary function test but in April 2015 her shortness breath and was getting worse and CT angiogram of the chest was performed on 04/09/2014. It showed a homogeneously enhancing infiltrating mediastinal mass is appreciated extending from the lower trachea through the subcarinal region, measuring 5.8 x 7.3 cm. There is mass effect and narrowing of the right and left main pulmonary arteries. The mass encompasses the distal trachea, carina and proximal right and left mainstem bronchi. A dominant right paratracheal lymph node is appreciated measuring 1.9 cm in short axis. Prevascular adenopathy is also appreciated largest lymph node measuring 7.3 cm in short axis. A small to moderate pericardial effusion is appreciated. There was also 1.7 x 1.9 CM nodule appreciated adjacent to the AP window and a stable left lower lobe pulmonary nodule. There was also small to moderate pericardial effusion and a slight increased prominence of the left adrenal nodule likely representing an adenoma. A PET scan was performed on 04/17/2014 and  it showed hypermetabolic bulky mediastinal adenopathy, right supraclavicular lymph node, left upper lobe nodular soft tissue and right middle lobe nodule. These findings are indicative primary bronchogenic carcinoma possibly small cell type. There is also hypermetabolic bilateral pulmonary parenchymal lesions and the findings are worrisome for stage IV disease. There is a small pericardial effusion a subcutaneous malignant in addition to the left adrenal adenoma.  On 04/23/2014 the patient underwent ultrasound-guided biopsy of the right supraclavicular lymph node by interventional radiology and the final pathology (Accession: [id number]) showed high-grade poorly differentiated neuroendocrine tumor, small cell type. Dr. Melvyn Novas kindly referred the patient to me today for evaluation and recommendation regarding treatment of her condition. When seen today the patient continues to complain of shortness of breath at baseline and increased with exertion as well as cough with tickling in her throat but no significant chest pain or hemoptysis. She denied having any significant headache or visual changes. The patient lost around 5 pounds in the last few months. She denied having any significant nausea or vomiting. Family history significant for mother who died at age 2 with respiratory failure and father died at age 13 with stomach cancer. The patient is single and has one son who lives in [place]. She is currently retired and used to work in a [employer]. The patient was accompanied today by her sister Frances Gonzalez. She has a history of smoking one pack per day for around 50 years but quit 10 years ago in 2005. She also has a prior history of alcohol abuse but quit 25 years ago. No history of drug abuse.  HPI  Past Medical History  Diagnosis Date  .  Hypertension   . Lung cancer     Past Surgical History  Procedure Laterality Date  . Lung cancer surgery  02/24/04    RUL Dr Arlyce Dice    Family History    Problem Relation Age of Onset  . Emphysema Maternal Grandmother     smoked  . Cancer Mother     Social History History  Substance Use Topics  . Smoking status: Former Smoker -- 1.00 packs/day for 50 years    Types: Cigarettes    Quit date: 02/24/2004  . Smokeless tobacco: Never Used  . Alcohol Use: No    No Known Allergies  Current Outpatient Prescriptions  Medication Sig Dispense Refill  . dextromethorphan (DELSYM) 30 MG/5ML liquid Take 60 mg by mouth 2 (two) times daily.       . irbesartan (AVAPRO) 150 MG tablet Take 150 mg by mouth every morning.      . Multiple Vitamins-Minerals (MULTIVITAMINS THER. W/MINERALS) TABS Take 1 tablet by mouth daily.       . pantoprazole (PROTONIX) 40 MG tablet Take 40 mg by mouth daily.      . simvastatin (ZOCOR) 20 MG tablet Take 10 mg by mouth every evening.      Marland Kitchen tetrahydrozoline 0.05 % ophthalmic solution Place 1 drop into both eyes daily as needed (red eyes).       No current facility-administered medications for this visit.    Review of Systems  Constitutional: positive for fatigue and weight loss Eyes: negative Ears, nose, mouth, throat, and face: negative Respiratory: positive for cough and dyspnea on exertion Cardiovascular: negative Gastrointestinal: negative Genitourinary:negative Integument/breast: negative Hematologic/lymphatic: negative Musculoskeletal:negative Neurological: negative Behavioral/Psych: negative Endocrine: negative Allergic/Immunologic: negative  Physical Exam  QMG:QQPYP, healthy, no distress, well nourished and well developed SKIN: skin color, texture, turgor are normal, no rashes or significant lesions HEAD: Normocephalic, No masses, lesions, tenderness or abnormalities EYES: normal, PERRLA EARS: External ears normal, Canals clear OROPHARYNX:no exudate, no erythema and lips, buccal mucosa, and tongue normal  NECK: supple, no adenopathy, no JVD LYMPH:  no palpable lymphadenopathy, no  hepatosplenomegaly BREAST:not examined LUNGS: expiratory wheezes bilaterally HEART: regular rate & rhythm, no murmurs and no gallops ABDOMEN:abdomen soft, non-tender, normal bowel sounds and no masses or organomegaly BACK: Back symmetric, no curvature., No CVA tenderness EXTREMITIES:no joint deformities, effusion, or inflammation, no edema, no skin discoloration, no clubbing  NEURO: alert & oriented x 3 with fluent speech, no focal motor/sensory deficits  PERFORMANCE STATUS: ECOG 1  LABORATORY DATA: Lab Results  Component Value Date   WBC 7.3 04/30/2014   HGB 12.5 04/30/2014   HCT 36.4 04/30/2014   MCV 87.5 04/30/2014   PLT 243 04/30/2014      Chemistry      Component Value Date/Time   NA 132* 04/30/2014 1325   NA 132* 04/05/2014 1705   K 4.1 04/30/2014 1325   K 4.4 04/05/2014 1705   CL 98 04/05/2014 1705   CO2 22 04/30/2014 1325   CO2 26 04/05/2014 1705   BUN 11.3 04/30/2014 1325   BUN 11 04/05/2014 1705   CREATININE 0.8 04/30/2014 1325   CREATININE 0.6 04/05/2014 1705      Component Value Date/Time   CALCIUM 9.5 04/30/2014 1325   CALCIUM 9.6 04/05/2014 1705   ALKPHOS 88 04/30/2014 1325   ALKPHOS 61 01/16/2013 1411   AST 17 04/30/2014 1325   AST 23 01/16/2013 1411   ALT 6 04/30/2014 1325   ALT 17 01/16/2013 1411   BILITOT 0.71 04/30/2014  1325   BILITOT 0.6 01/16/2013 1411       RADIOGRAPHIC STUDIES: Ct Angio Chest Pe W/cm &/or Wo Cm  04/09/2014   CLINICAL DATA:  Unexplained sob/ r/o PE  EXAM: CT ANGIOGRAPHY CHEST WITH CONTRAST  TECHNIQUE: Multidetector CT imaging of the chest was performed using the standard protocol during bolus administration of intravenous contrast. Multiplanar CT image reconstructions and MIPs were obtained to evaluate the vascular anatomy.  CONTRAST:  61mL OMNIPAQUE IOHEXOL 350 MG/ML SOLN  COMPARISON:  DG CHEST 2 VIEW dated 12/31/2013; CT CHEST W/O CM dated 04/09/2009  FINDINGS: The thoracic inlet is unremarkable.  A homogeneously enhancing infiltrating mediastinal mass is appreciated  extending from the lower trachea through the subcarinal region, measuring 5.8 x 7.3 cm image 41 series 5. There is mass effect and narrowing of the right and left main pulmonary arteries. The mass encompasses the distal trachea, carina and proximal right and left mainstem bronchi. A dominant right paratracheal lymph node is appreciated image 21 series 5 measuring 1.9 cm in short axis. Prevascular adenopathy is also appreciated largest lymph node measuring 7.3 cm in short axis.  A small to moderate pericardial effusion is appreciated.  There are no filling defects within the main, lobar, or segmental pulmonary arteries.  A 1.7 x 1.9 cm nodule is appreciated adjacent to the AP window image 35 series 7. There are components of spiculation. Areas of increased density are also appreciated within the right perihilar region and left suprahilar regions. The findings of the right likely represent a component of postsurgical change.  There is diffuse interlobular septal thickening and areas of subpleural septal thickening primarily within the lung bases. Regions of bronchiectasis are appreciated within the apices and areas of paraseptal emphysematous changes. Diffuse ground-glass density is appreciated throughout both lungs. A 7 mm pulmonary nodule appreciated within the central right lower lobe image 60 series 7, stable.  The visualized upper abdominal viscera demonstrates diffuse calcifications within the spleen. A 2.5 x 1.8 cm left adrenal nodule is appreciated slightly more prominent when compared to prior likely representing an adrenal adenoma. Remaining visualized upper abdominal viscera are grossly unremarkable.  Review of the MIP images confirms the above findings.  IMPRESSION: 1. Infiltrating mediastinal mass with mediastinal adenopathy. Differential considerations are recurrent lung neoplasia considering the patient's history versus lymphoma, metastatic disease. Tissue sampling recommended. These results were called  by telephone at the time of interpretation on 04/09/2014 at 2:02 PM to Dr. Christinia Gully , who verbally acknowledged these results. 2. New pulmonary nodule adjacent to the AP window concerning for metastasis. Stable left lower lobe pulmonary nodule. 3. No evidence of pulmonary arterial embolic disease 4. Small to moderate pericardial effusion 5. Slight increased prominence of a left adrenal nodule likely representing an adenoma. 6. Interval progression of the patient's interstitial lung disease.   Electronically Signed   By: Margaree Mackintosh M.D.   On: 04/09/2014 14:05   Nm Pet Image Restag (ps) Skull Base To Thigh  04/17/2014   CLINICAL DATA:  Subsequent treatment strategy for lung cancer.  EXAM: NUCLEAR MEDICINE PET SKULL BASE TO THIGH  TECHNIQUE: 9.6 mCi F-18 FDG was injected intravenously. Full-ring PET imaging was performed from the skull base to thigh after the radiotracer. CT data was obtained and used for attenuation correction and anatomic localization.  FASTING BLOOD GLUCOSE:  Value: 108 mg/dl  COMPARISON:  CT chest 04/09/2014 and PET 02/06/2004.  FINDINGS: NECK  No hypermetabolic lymph nodes in the neck. Probable physiologic uptake in the  anterior oropharynx (PET image 27), as there is no CT correlate. CT images show no acute findings.  CHEST  Right supraclavicular lymph node measures 9 mm with an SUV max of 5.6 (CT image 41 and PET image 40). Bulky mediastinal adenopathy is hypermetabolic as well. Confluent subcarinal mass measures approximately 5.8 x 5.8 cm with an SUV max of 9.0.  Nodular confluent soft tissue in the medial aspect of the left upper lobe measures approximately 1.4 x 2.0 cm (CT series 8, image 27) with an SUV max of 13.0 (PET image 63). A 7 mm right middle lobe nodule is hypermetabolic as well (series 8, image 18). No additional areas of abnormal hyper metabolism in the chest.  CT images show atherosclerotic calcification of the arterial vasculature, including three-vessel involvement of  the coronary arteries. Heart is mildly enlarged. Small pericardial effusion. No pericardial effusion. Centrilobular emphysema. Postoperative changes of right upper lobectomy. 6 mm left lower lobe nodule (series 8, image 50) may be too small for PET resolution.  ABDOMEN/PELVIS  No areas of abnormal hypermetabolism in the liver, adrenal glands, spleen or pancreas. No hypermetabolic lymph nodes.  CT images show the liver, gallbladder and right adrenal gland to be unremarkable. A 1.8 x 2.2 cm fluid density nodule in the left adrenal gland is unchanged. Probable renal vascular calcifications bilaterally. Kidneys, spleen, pancreas, stomach and bowel are otherwise grossly unremarkable. Uterus and ovaries are visualized. Bladder is grossly unremarkable. Atherosclerotic calcification of the arterial vasculature without abdominal aortic aneurysm. No free fluid.  SKELETON  No abnormal osseous hypermetabolism. Degenerative changes are seen in the spine.  IMPRESSION: 1. Hypermetabolic bulky mediastinal adenopathy, right supraclavicular lymph node, left upper lobe confluent nodular soft tissue and right middle lobe nodule. Findings are indicative of primary bronchogenic carcinoma, possibly small cell type. Given hypermetabolic bilateral pulmonary parenchymal lesions, findings are worrisome for stage IV disease. 2. Small pericardial effusion, possibly malignant. 3. Three-vessel coronary artery calcification. 4. Left adrenal adenoma.   Electronically Signed   By: Lorin Picket M.D.   On: 04/17/2014 10:50   US Biopsy  04/23/2014   CLINICAL DATA:  Abnormal adenopathy  EXAM: ULTRASOUND-GUIDED BIOPSY OF A RIGHT SUPRACLAVICULAR LYMPH NODE. CORE.  MEDICATIONS AND MEDICAL HISTORY: None  ANESTHESIA/SEDATION: None  PROCEDURE: The procedure, risks, benefits, and alternatives were explained to the patient. Questions regarding the procedure were encouraged and answered. The patient understands and consents to the procedure.  The right  neck was prepped with Betadine in a sterile fashion, and a sterile drape was applied covering the operative field. A sterile gown and sterile gloves were used for the procedure.  Under sonographic guidance, 5 18 gauge core biopsies of the enlarged right supraclavicular lymph node were obtained. Final imaging was performed.  FINDINGS: The images document guide needle placement within the enlarged right supraclavicular lymph. Post biopsy images demonstrate no amber.  IMPRESSION: Successful ultrasound-guided core biopsy of an enlarged right supraclavicular lymph node   Electronically Signed   By: Maryclare Bean M.D.   On: 04/23/2014 15:53   [place] Cm  04/09/2014   CLINICAL DATA:  Sinus drainage with chronic cough.  EXAM: CT PARANASAL SINUS LIMITED WITHOUT CONTRAST  TECHNIQUE: Non-contiguous multidetector CT images of the paranasal sinuses were obtained in a single plane without contrast.  COMPARISON:  None.  FINDINGS: The maxillary sinuses are clear.  The sphenoid sinus is clear.  The ethmoid sinuses are clear.  The frontal sinuses are clear.  Mild nasal septal deviation right to  left of approximately 2 mm.  The intracranial compartment is unremarkable. Bilateral carotid calcification is evident. No osseous lesions are observed.  IMPRESSION: No significant paranasal sinus disease.   Electronically Signed   By: Rolla Flatten M.D.   On: 04/09/2014 15:10    ASSESSMENT: This is a very pleasant 79 years old white female recently diagnosed with extensive stage small cell lung cancer presenting with large mediastinal mass with bilateral mediastinal lymphadenopathy as well as bilateral pulmonary nodules and supraclavicular lymph nodes in addition to questionable malignant pericardial effusion diagnosed in April of 2015   PLAN: I have a lengthy discussion with the patient and her sister today about her current disease stage, prognosis and treatment options. I discussed the previous scan results and showed  the images to the patient and her sister. I will complete the staging workup by ordering MRI of the brain to rule out brain metastases. I discussed with the patient the option of palliative care and hospice referral versus consideration of palliative systemic chemotherapy with carboplatin for AUC of 5 on day 1 and etoposide 120 mg/M2 on days 1, 2 and 3 with Neulasta support on day 4. The patient is interested in proceeding with systemic chemotherapy. I discussed with her the adverse effect of this treatment including but not limited to alopecia, myelosuppression, nausea and vomiting, peripheral neuropathy, liver or renal dysfunction. I will arrange for her to receive the first cycle of this treatment in early next week. The patient will have a chemotherapy education class this week. I would also referred the patient to interventional radiology for consideration of Port-A-Cath placement. She will come back for followup visit in 2 weeks for evaluation and management any adverse effect of her treatment. She was advised to call immediately if she has any concerning symptoms in the interval. I will Escribe Compazine 10 mg by mouth every 6 hours as needed for nausea in addition to Emla cream to be applied to the Port-A-Cath site before treatment. The patient voices understanding of current disease status and treatment options and is in agreement with the current care plan.  All questions were answered. The patient knows to call the clinic with any problems, questions or concerns. We can certainly see the patient much sooner if necessary.  Thank you so much for allowing me to participate in the care of Frances Gonzalez. I will continue to follow up the patient with you and assist in her care.  I spent 55 minutes counseling the patient face to face. The total time spent in the appointment was 80 minutes.  Disclaimer: This note was dictated with voice recognition software. Similar sounding words can  inadvertently be transcribed and may not be corrected upon review.   Curt Bears 04/30/2014, 4:43 PM

## 2014-05-01 ENCOUNTER — Telehealth: Payer: Self-pay | Admitting: Internal Medicine

## 2014-05-01 ENCOUNTER — Telehealth: Payer: Self-pay | Admitting: *Deleted

## 2014-05-01 NOTE — Telephone Encounter (Signed)
cld pt to adv of chemo starting date 05/06/14. Pt sated will get sch when she comes for her class tomorrow

## 2014-05-01 NOTE — Telephone Encounter (Signed)
Per staff message and POF I have scheduled appts.  JMW  

## 2014-05-02 ENCOUNTER — Other Ambulatory Visit: Payer: Medicare Other

## 2014-05-02 ENCOUNTER — Telehealth: Payer: Self-pay | Admitting: Internal Medicine

## 2014-05-02 ENCOUNTER — Encounter: Payer: Self-pay | Admitting: *Deleted

## 2014-05-02 NOTE — Telephone Encounter (Signed)
pt came in for updated sch-gave to pt

## 2014-05-03 ENCOUNTER — Ambulatory Visit: Payer: Medicare Other | Admitting: Internal Medicine

## 2014-05-03 ENCOUNTER — Ambulatory Visit (HOSPITAL_COMMUNITY)
Admission: RE | Admit: 2014-05-03 | Discharge: 2014-05-03 | Disposition: A | Discharge status: Home / Self Care | Payer: Medicare Other | Source: Ambulatory Visit | Attending: Internal Medicine | Admitting: Internal Medicine

## 2014-05-03 ENCOUNTER — Encounter (HOSPITAL_COMMUNITY): Payer: Self-pay

## 2014-05-03 ENCOUNTER — Other Ambulatory Visit: Payer: Self-pay | Admitting: Internal Medicine

## 2014-05-03 DIAGNOSIS — Z902 Acquired absence of lung [part of]: Secondary | ICD-10-CM | POA: Insufficient documentation

## 2014-05-03 DIAGNOSIS — J9859 Other diseases of mediastinum, not elsewhere classified: Secondary | ICD-10-CM

## 2014-05-03 DIAGNOSIS — I10 Essential (primary) hypertension: Secondary | ICD-10-CM | POA: Insufficient documentation

## 2014-05-03 DIAGNOSIS — Z87891 Personal history of nicotine dependence: Secondary | ICD-10-CM | POA: Insufficient documentation

## 2014-05-03 DIAGNOSIS — C3490 Malignant neoplasm of unspecified part of unspecified bronchus or lung: Secondary | ICD-10-CM | POA: Diagnosis not present

## 2014-05-03 DIAGNOSIS — G319 Degenerative disease of nervous system, unspecified: Secondary | ICD-10-CM | POA: Insufficient documentation

## 2014-05-03 DIAGNOSIS — C3410 Malignant neoplasm of upper lobe, unspecified bronchus or lung: Secondary | ICD-10-CM | POA: Insufficient documentation

## 2014-05-03 DIAGNOSIS — Z79899 Other long term (current) drug therapy: Secondary | ICD-10-CM | POA: Insufficient documentation

## 2014-05-03 LAB — CBC
HCT: 36.2 % (ref 36.0–46.0)
Hemoglobin: 12.8 g/dL (ref 12.0–15.0)
MCH: 29.6 pg (ref 26.0–34.0)
MCHC: 35.4 g/dL (ref 30.0–36.0)
MCV: 83.6 fL (ref 78.0–100.0)
PLATELETS: 236 10*3/uL (ref 150–400)
RBC: 4.33 MIL/uL (ref 3.87–5.11)
RDW: 13.5 % (ref 11.5–15.5)
WBC: 5.2 10*3/uL (ref 4.0–10.5)

## 2014-05-03 LAB — PROTIME-INR
INR: 1.05 (ref 0.00–1.49)
Prothrombin Time: 13.5 seconds (ref 11.6–15.2)

## 2014-05-03 LAB — APTT: aPTT: 31 seconds (ref 24–37)

## 2014-05-03 MED ORDER — FENTANYL CITRATE 0.05 MG/ML IJ SOLN
INTRAMUSCULAR | Status: AC
  Filled 2014-05-03: qty 2

## 2014-05-03 MED ORDER — HEPARIN SOD (PORK) LOCK FLUSH 100 UNIT/ML IV SOLN
500.0000 [IU] | Freq: Once | INTRAVENOUS | Status: AC
  Administered 2014-05-03: 500 [IU] via INTRAVENOUS

## 2014-05-03 MED ORDER — FENTANYL CITRATE 0.05 MG/ML IJ SOLN
INTRAMUSCULAR | Status: AC | PRN
  Administered 2014-05-03 (×2): 50 ug via INTRAVENOUS

## 2014-05-03 MED ORDER — CEFAZOLIN SODIUM-DEXTROSE 2-3 GM-% IV SOLR
2.0000 g | INTRAVENOUS | Status: AC
  Administered 2014-05-03: 2 g via INTRAVENOUS
  Filled 2014-05-03: qty 50

## 2014-05-03 MED ORDER — LIDOCAINE HCL 1 % IJ SOLN
INTRAMUSCULAR | Status: AC
  Filled 2014-05-03: qty 20

## 2014-05-03 MED ORDER — HEPARIN SOD (PORK) LOCK FLUSH 100 UNIT/ML IV SOLN
INTRAVENOUS | Status: AC
  Filled 2014-05-03: qty 5

## 2014-05-03 MED ORDER — MIDAZOLAM HCL 2 MG/2ML IJ SOLN
INTRAMUSCULAR | Status: AC
  Filled 2014-05-03: qty 2

## 2014-05-03 MED ORDER — GADOBENATE DIMEGLUMINE 529 MG/ML IV SOLN
12.0000 mL | Freq: Once | INTRAVENOUS | Status: AC | PRN
  Administered 2014-05-03: 12 mL via INTRAVENOUS

## 2014-05-03 MED ORDER — SODIUM CHLORIDE 0.9 % IV SOLN
INTRAVENOUS | Status: DC
  Administered 2014-05-03: 13:00:00 via INTRAVENOUS

## 2014-05-03 MED ORDER — MIDAZOLAM HCL 2 MG/2ML IJ SOLN
INTRAMUSCULAR | Status: AC | PRN
  Administered 2014-05-03 (×2): 1 mg via INTRAVENOUS

## 2014-05-03 NOTE — H&P (Signed)
Frances Gonzalez is an 79 y.o. female.   Chief Complaint: "I'm here for a port a cath" HPI: Patient with prior history of right lung adenocarcinoma 2005 (s/p RUL lobectomy); now with recently diagnosed extensive stage small cell lung carcinoma. She presents today for port a cath placement for chemotherapy.  Past Medical History  Diagnosis Date  . Hypertension   . Lung cancer     Past Surgical History  Procedure Laterality Date  . Lung cancer surgery  02/24/04    RUL Dr Arlyce Dice    Family History  Problem Relation Age of Onset  . Emphysema Maternal Grandmother     smoked  . Cancer Mother    Social History:  reports that she quit smoking about 10 years ago. Her smoking use included Cigarettes. She has a 50 pack-year smoking history. She has never used smokeless tobacco. She reports that she does not drink alcohol or use illicit drugs.  Allergies: No Known Allergies  Current outpatient prescriptions:dextromethorphan (DELSYM) 30 MG/5ML liquid, Take 60 mg by mouth 2 (two) times daily. , Disp: , Rfl: ;  irbesartan (AVAPRO) 150 MG tablet, Take 150 mg by mouth every morning., Disp: , Rfl: ;  lidocaine-prilocaine (EMLA) cream, Apply 1 application topically as needed., Disp: 30 g, Rfl: 0;  Multiple Vitamins-Minerals (MULTIVITAMINS THER. W/MINERALS) TABS, Take 1 tablet by mouth daily. , Disp: , Rfl:  pantoprazole (PROTONIX) 40 MG tablet, Take 40 mg by mouth daily., Disp: , Rfl: ;  prochlorperazine (COMPAZINE) 10 MG tablet, Take 1 tablet (10 mg total) by mouth every 6 (six) hours as needed for nausea or vomiting., Disp: 60 tablet, Rfl: 0;  simvastatin (ZOCOR) 20 MG tablet, Take 10 mg by mouth every evening., Disp: , Rfl: ;  tetrahydrozoline 0.05 % ophthalmic solution, Place 1 drop into both eyes daily as needed (red eyes)., Disp: , Rfl:  Current facility-administered medications:0.9 %  sodium chloride infusion, , Intravenous, Continuous, D Rowe Robert, PA-C, Last Rate: 50 mL/hr at 05/03/14 1305;   ceFAZolin (ANCEF) IVPB 2 g/50 mL premix, 2 g, Intravenous, On Call, D Rowe Robert, PA-C   Results for orders placed during the hospital encounter of 05/03/14 (from the past 48 hour(s))  APTT     Status: None   Collection Time    05/03/14  1:05 PM      Result Value Ref Range   aPTT 31  24 - 37 seconds  PROTIME-INR     Status: None   Collection Time    05/03/14  1:05 PM      Result Value Ref Range   Prothrombin Time 13.5  11.6 - 15.2 seconds   INR 1.05  0.00 - 1.49   Results for orders placed during the hospital encounter of 05/03/14  APTT      Result Value Ref Range   aPTT 31  24 - 37 seconds  CBC      Result Value Ref Range   WBC 5.2  4.0 - 10.5 K/uL   RBC 4.33  3.87 - 5.11 MIL/uL   Hemoglobin 12.8  12.0 - 15.0 g/dL   HCT 36.2  36.0 - 46.0 %   MCV 83.6  78.0 - 100.0 fL   MCH 29.6  26.0 - 34.0 pg   MCHC 35.4  30.0 - 36.0 g/dL   RDW 13.5  11.5 - 15.5 %   Platelets 236  150 - 400 K/uL  PROTIME-INR      Result Value Ref Range   Prothrombin Time 13.5  11.6 - 15.2 seconds   INR 1.05  0.00 - 1.49    Review of Systems  Constitutional: Positive for weight loss. Negative for fever and chills.  Respiratory: Positive for cough and shortness of breath. Negative for hemoptysis.   Cardiovascular:       Occ chest discomfort  Gastrointestinal: Negative for nausea, vomiting and abdominal pain.  Musculoskeletal: Positive for back pain.  Neurological: Negative for headaches.  Endo/Heme/Allergies: Does not bruise/bleed easily.    Blood pressure 161/92, pulse 94, temperature 98 F (36.7 C), resp. rate 16, SpO2 88.00%. Physical Exam  Constitutional: She is oriented to person, place, and time. She appears well-developed and well-nourished.  Cardiovascular: Normal rate and regular rhythm.   Respiratory: Effort normal.  Coarse BS throughout  GI: Soft. Bowel sounds are normal. There is no tenderness.  Musculoskeletal: Normal range of motion. She exhibits no edema.  Neurological: She is  alert and oriented to person, place, and time.     Assessment/Plan Patient with prior history of right lung adenocarcinoma 2005 (s/p RUL lobectomy); now with recently diagnosed extensive stage small cell lung carcinoma. She presents today for port a cath placement for chemotherapy. Details/risks of procedure d/w pt/sister with their understanding and consent. With oxygen sats in the 80's currently will minimize IV conscious sedation.   Frances Gonzalez 05/03/2014, 1:29 PM

## 2014-05-03 NOTE — Discharge Instructions (Signed)
Moderate Sedation, Adult Moderate sedation is given to help you relax or even sleep through a procedure. You may remain sleepy, be clumsy, or have poor balance for several hours following this procedure. Arrange for a responsible adult, family member, or friend to take you home. A responsible adult should stay with you for at least 24 hours or until the medicines have worn off.  Do not participate in any activities where you could become injured for the next 24 hours, or until you feel normal again. Do not:  Drive.  Swim.  Ride a bicycle.  Operate heavy machinery.  Cook.  Use power tools.  Climb ladders.  Work at [employer].  Do not make important decisions or sign legal documents until you are improved.  Vomiting may occur if you eat too soon. When you can drink without vomiting, try water, juice, or soup. Try solid foods if you feel little or no nausea.  Only take over-the-counter or prescription medications for pain, discomfort, or fever as directed by your caregiver.If pain medications have been prescribed for you, ask your caregiver how soon it is safe to take them.  Make sure you and your family fully understands everything about the medication given to you. Make sure you understand what side effects may occur.  You should not drink alcohol, take sleeping pills, or medications that cause drowsiness for at least 24 hours.  If you smoke, do not smoke alone.  If you are feeling better, you may resume normal activities 24 hours after receiving sedation.  Keep all appointments as scheduled. Follow all instructions.  Ask questions if you do not understand. SEEK MEDICAL CARE IF:   Your skin is pale or bluish in color.  You continue to feel sick to your stomach (nauseous) or throw up (vomit).  Your pain is getting worse and not helped by medication.  You have bleeding or swelling.  You are still sleepy or feeling clumsy after 24 hours. SEEK IMMEDIATE MEDICAL CARE IF:    You develop a rash.  You have difficulty breathing.  You develop any type of allergic problem.  You have a fever. Document Released: 09/07/2001 Document Revised: 03/06/2012 Document Reviewed: 08/20/2013 [facility] Patient Information 2014 [place]. Implanted Port Insertion, Care After Refer to this sheet in the next few weeks. These instructions provide you with information on caring for yourself after your procedure. Your health care provider may also give you more specific instructions. Your treatment has been planned according to current medical practices, but problems sometimes occur. Call your health care provider if you have any problems or questions after your procedure. WHAT TO EXPECT AFTER THE PROCEDURE After your procedure, it is typical to have the following:   Discomfort at the port insertion site. Ice packs to the area will help.  Bruising on the skin over the port. This will subside in 3 4 days. HOME CARE INSTRUCTIONS  After your port is placed, you will get a manufacturer's information card. The card has information about your port. Keep this card with you at all times.   Know what kind of port you have. There are many types of ports available.   Wear a medical alert bracelet in case of an emergency. This can help alert health care workers that you have a port.   The port can stay in for as long as your health care provider believes it is necessary.   A home health care nurse may give medicines and take care of the port.  You or a family member can get special training and directions for giving medicine and taking care of the port at home.  SEEK MEDICAL CARE IF:  Your port does not flush or you are unable to get a blood return.   SEEK IMMEDIATE MEDICAL CARE IF:  You have new fluid or pus coming from your incision.   You notice a bad smell coming from your incision site.   You have swelling, pain, or more redness at the incision or port site.    You have a fever or chills.   You have chest pain or shortness of breath. Document Released: 10/03/2013 Document Reviewed: 08/20/2013 [facility] Patient Information 2014 [place], [place].

## 2014-05-03 NOTE — Procedures (Signed)
Procedure:  Porta-cath placement Access:  Right IJ vein Findings:  Tip of catheter at cavoatrial junction.  No PTX.  OK to use.

## 2014-05-03 NOTE — H&P (Signed)
Agree.  Patient seen.  Will proceed with port placement today.

## 2014-05-06 ENCOUNTER — Other Ambulatory Visit: Payer: Medicare Other

## 2014-05-06 ENCOUNTER — Ambulatory Visit (HOSPITAL_BASED_OUTPATIENT_CLINIC_OR_DEPARTMENT_OTHER): Payer: Medicare Other

## 2014-05-06 ENCOUNTER — Other Ambulatory Visit (HOSPITAL_BASED_OUTPATIENT_CLINIC_OR_DEPARTMENT_OTHER): Payer: Medicare Other

## 2014-05-06 ENCOUNTER — Telehealth: Payer: Self-pay | Admitting: Medical Oncology

## 2014-05-06 VITALS — BP 165/84 | HR 99 | Temp 98.4°F

## 2014-05-06 DIAGNOSIS — C3490 Malignant neoplasm of unspecified part of unspecified bronchus or lung: Secondary | ICD-10-CM

## 2014-05-06 DIAGNOSIS — Z85118 Personal history of other malignant neoplasm of bronchus and lung: Secondary | ICD-10-CM

## 2014-05-06 DIAGNOSIS — Z5111 Encounter for antineoplastic chemotherapy: Secondary | ICD-10-CM

## 2014-05-06 DIAGNOSIS — C7A1 Malignant poorly differentiated neuroendocrine tumors: Secondary | ICD-10-CM

## 2014-05-06 LAB — CBC WITH DIFFERENTIAL/PLATELET
BASO%: 0.4 % (ref 0.0–2.0)
Basophils Absolute: 0 10*3/uL (ref 0.0–0.1)
EOS%: 0.6 % (ref 0.0–7.0)
Eosinophils Absolute: 0 10*3/uL (ref 0.0–0.5)
HCT: 36.3 % (ref 34.8–46.6)
HGB: 12.5 g/dL (ref 11.6–15.9)
LYMPH%: 10.4 % — AB (ref 14.0–49.7)
MCH: 29.7 pg (ref 25.1–34.0)
MCHC: 34.4 g/dL (ref 31.5–36.0)
MCV: 86.4 fL (ref 79.5–101.0)
MONO#: 0.9 10*3/uL (ref 0.1–0.9)
MONO%: 13 % (ref 0.0–14.0)
NEUT#: 5.2 10*3/uL (ref 1.5–6.5)
NEUT%: 75.6 % (ref 38.4–76.8)
PLATELETS: 244 10*3/uL (ref 145–400)
RBC: 4.2 10*6/uL (ref 3.70–5.45)
RDW: 13.9 % (ref 11.2–14.5)
WBC: 6.8 10*3/uL (ref 3.9–10.3)
lymph#: 0.7 10*3/uL — ABNORMAL LOW (ref 0.9–3.3)

## 2014-05-06 LAB — COMPREHENSIVE METABOLIC PANEL (CC13)
ALT: 8 U/L (ref 0–55)
AST: 17 U/L (ref 5–34)
Albumin: 3.7 g/dL (ref 3.5–5.0)
Alkaline Phosphatase: 85 U/L (ref 40–150)
Anion Gap: 8 mEq/L (ref 3–11)
BILIRUBIN TOTAL: 0.63 mg/dL (ref 0.20–1.20)
BUN: 9.1 mg/dL (ref 7.0–26.0)
CO2: 24 mEq/L (ref 22–29)
Calcium: 9.5 mg/dL (ref 8.4–10.4)
Chloride: 95 mEq/L — ABNORMAL LOW (ref 98–109)
Creatinine: 0.8 mg/dL (ref 0.6–1.1)
Glucose: 121 mg/dl (ref 70–140)
POTASSIUM: 4.1 meq/L (ref 3.5–5.1)
Sodium: 126 mEq/L — ABNORMAL LOW (ref 136–145)
Total Protein: 7.4 g/dL (ref 6.4–8.3)

## 2014-05-06 MED ORDER — SODIUM CHLORIDE 0.9 % IV SOLN
Freq: Once | INTRAVENOUS | Status: AC
  Administered 2014-05-06: 12:00:00 via INTRAVENOUS

## 2014-05-06 MED ORDER — ONDANSETRON 16 MG/50ML IVPB (CHCC)
INTRAVENOUS | Status: AC
  Filled 2014-05-06: qty 16

## 2014-05-06 MED ORDER — SODIUM CHLORIDE 0.9 % IV SOLN
120.0000 mg/m2 | Freq: Once | INTRAVENOUS | Status: AC
  Administered 2014-05-06: 200 mg via INTRAVENOUS
  Filled 2014-05-06: qty 10

## 2014-05-06 MED ORDER — ONDANSETRON 16 MG/50ML IVPB (CHCC)
16.0000 mg | Freq: Once | INTRAVENOUS | Status: AC
  Administered 2014-05-06: 16 mg via INTRAVENOUS

## 2014-05-06 MED ORDER — HEPARIN SOD (PORK) LOCK FLUSH 100 UNIT/ML IV SOLN
500.0000 [IU] | Freq: Once | INTRAVENOUS | Status: AC | PRN
  Administered 2014-05-06: 500 [IU]
  Filled 2014-05-06: qty 5

## 2014-05-06 MED ORDER — DEXAMETHASONE SODIUM PHOSPHATE 20 MG/5ML IJ SOLN
20.0000 mg | Freq: Once | INTRAMUSCULAR | Status: AC
  Administered 2014-05-06: 20 mg via INTRAVENOUS

## 2014-05-06 MED ORDER — SODIUM CHLORIDE 0.9 % IJ SOLN
10.0000 mL | INTRAMUSCULAR | Status: DC | PRN
  Administered 2014-05-06: 10 mL
  Filled 2014-05-06: qty 10

## 2014-05-06 MED ORDER — DEXAMETHASONE SODIUM PHOSPHATE 20 MG/5ML IJ SOLN
INTRAMUSCULAR | Status: AC
  Filled 2014-05-06: qty 5

## 2014-05-06 MED ORDER — SODIUM CHLORIDE 0.9 % IV SOLN
359.0000 mg | Freq: Once | INTRAVENOUS | Status: AC
  Administered 2014-05-06: 360 mg via INTRAVENOUS
  Filled 2014-05-06: qty 36

## 2014-05-06 NOTE — Patient Instructions (Addendum)
[place] Discharge Instructions for Patients Receiving Chemotherapy  Today you received the following chemotherapy agents: Carboplatin, Etoposide.   To help prevent nausea and vomiting after your treatment, we encourage you to take your nausea medication: Compazine 10 mg every 6 hrs as needed. Take one dose tonight at bedtime regardless if you are nauseated or not.    If you develop nausea and vomiting that is not controlled by your nausea medication, call the clinic.   BELOW ARE SYMPTOMS THAT SHOULD BE REPORTED IMMEDIATELY:  *FEVER GREATER THAN 100.5 F  *CHILLS WITH OR WITHOUT FEVER  NAUSEA AND VOMITING THAT IS NOT CONTROLLED WITH YOUR NAUSEA MEDICATION  *UNUSUAL SHORTNESS OF BREATH  *UNUSUAL BRUISING OR BLEEDING  TENDERNESS IN MOUTH AND THROAT WITH OR WITHOUT PRESENCE OF ULCERS  *URINARY PROBLEMS  *BOWEL PROBLEMS  UNUSUAL RASH Items with * indicate a potential emergency and should be followed up as soon as possible.  Feel free to call the clinic you have any questions or concerns. The clinic phone number is [phone].   Carboplatin injection What is this medicine? CARBOPLATIN (KAR boe pla tin) is a chemotherapy drug. It targets fast dividing cells, like cancer cells, and causes these cells to die. This medicine is used to treat ovarian cancer and many other cancers. This medicine may be used for other purposes; ask your health care provider or pharmacist if you have questions. COMMON BRAND NAME(S): Paraplatin What should I tell my health care provider before I take this medicine? They need to know if you have any of these conditions: -blood disorders -hearing problems -kidney disease -recent or ongoing radiation therapy -an unusual or allergic reaction to carboplatin, cisplatin, other chemotherapy, other medicines, foods, dyes, or preservatives -pregnant or trying to get pregnant -breast-feeding How should I use this medicine? This drug is  usually given as an infusion into a vein. It is administered in a hospital or clinic by a specially trained health care professional. Talk to your pediatrician regarding the use of this medicine in children. Special care may be needed. Overdosage: If you think you have taken too much of this medicine contact a poison control center or emergency room at once. NOTE: This medicine is only for you. Do not share this medicine with others. What if I miss a dose? It is important not to miss a dose. Call your doctor or health care professional if you are unable to keep an appointment. What may interact with this medicine? -medicines for seizures -medicines to increase blood counts like filgrastim, pegfilgrastim, sargramostim -some antibiotics like amikacin, gentamicin, neomycin, streptomycin, tobramycin -vaccines Talk to your doctor or health care professional before taking any of these medicines: -acetaminophen -aspirin -ibuprofen -ketoprofen -naproxen This list may not describe all possible interactions. Give your health care provider a list of all the medicines, herbs, non-prescription drugs, or dietary supplements you use. Also tell them if you smoke, drink alcohol, or use illegal drugs. Some items may interact with your medicine. What should I watch for while using this medicine? Your condition will be monitored carefully while you are receiving this medicine. You will need important blood work done while you are taking this medicine. This drug may make you feel generally unwell. This is not uncommon, as chemotherapy can affect healthy cells as well as cancer cells. Report any side effects. Continue your course of treatment even though you feel ill unless your doctor tells you to stop. In some cases, you may be given additional medicines to  help with side effects. Follow all directions for their use. Call your doctor or health care professional for advice if you get a fever, chills or sore throat,  or other symptoms of a cold or flu. Do not treat yourself. This drug decreases your body's ability to fight infections. Try to avoid being around people who are sick. This medicine may increase your risk to bruise or bleed. Call your doctor or health care professional if you notice any unusual bleeding. Be careful brushing and flossing your teeth or using a toothpick because you may get an infection or bleed more easily. If you have any dental work done, tell your dentist you are receiving this medicine. Avoid taking products that contain aspirin, acetaminophen, ibuprofen, naproxen, or ketoprofen unless instructed by your doctor. These medicines may hide a fever. Do not become pregnant while taking this medicine. Women should inform their doctor if they wish to become pregnant or think they might be pregnant. There is a potential for serious side effects to an unborn child. Talk to your health care professional or pharmacist for more information. Do not breast-feed an infant while taking this medicine. What side effects may I notice from receiving this medicine? Side effects that you should report to your doctor or health care professional as soon as possible: -allergic reactions like skin rash, itching or hives, swelling of the face, lips, or tongue -signs of infection - fever or chills, cough, sore throat, pain or difficulty passing urine -signs of decreased platelets or bleeding - bruising, pinpoint red spots on the skin, black, tarry stools, nosebleeds -signs of decreased red blood cells - unusually weak or tired, fainting spells, lightheadedness -breathing problems -changes in hearing -changes in vision -chest pain -high blood pressure -low blood counts - This drug may decrease the number of white blood cells, red blood cells and platelets. You may be at increased risk for infections and bleeding. -nausea and vomiting -pain, swelling, redness or irritation at the injection site -pain,  tingling, numbness in the hands or feet -problems with balance, talking, walking -trouble passing urine or change in the amount of urine Side effects that usually do not require medical attention (report to your doctor or health care professional if they continue or are bothersome): -hair loss -loss of appetite -metallic taste in the mouth or changes in taste This list may not describe all possible side effects. Call your doctor for medical advice about side effects. You may report side effects to FDA at 1-800-FDA-1088. Where should I keep my medicine? This drug is given in a hospital or clinic and will not be stored at home. NOTE: This sheet is a summary. It may not cover all possible information. If you have questions about this medicine, talk to your doctor, pharmacist, or health care provider.  2014, Elsevier/[employer] Standard. (2008-03-19 14:38:05)   Etoposide, VP-16 injection What is this medicine? ETOPOSIDE, VP-16 (e toe POE side) is a chemotherapy drug. It is used to treat testicular cancer, lung cancer, and other cancers. This medicine may be used for other purposes; ask your health care provider or pharmacist if you have questions. COMMON BRAND NAME(S): Etopophos, Toposar, VePesid What should I tell my health care provider before I take this medicine? They need to know if you have any of these conditions: -infection -kidney disease -low blood counts, like low white cell, platelet, or red cell counts -an unusual or allergic reaction to etoposide, other chemotherapeutic agents, other medicines, foods, dyes, or preservatives -pregnant or trying to  get pregnant -breast-feeding How should I use this medicine? This medicine is for infusion into a vein. It is administered in a hospital or clinic by a specially trained health care professional. Talk to your pediatrician regarding the use of this medicine in children. Special care may be needed. Overdosage: If you think you have taken too  much of this medicine contact a poison control center or emergency room at once. NOTE: This medicine is only for you. Do not share this medicine with others. What if I miss a dose? It is important not to miss your dose. Call your doctor or health care professional if you are unable to keep an appointment. What may interact with this medicine? -cyclosporine -medicines to increase blood counts like filgrastim, pegfilgrastim, sargramostim -vaccines This list may not describe all possible interactions. Give your health care provider a list of all the medicines, herbs, non-prescription drugs, or dietary supplements you use. Also tell them if you smoke, drink alcohol, or use illegal drugs. Some items may interact with your medicine. What should I watch for while using this medicine? Visit your doctor for checks on your progress. This drug may make you feel generally unwell. This is not uncommon, as chemotherapy can affect healthy cells as well as cancer cells. Report any side effects. Continue your course of treatment even though you feel ill unless your doctor tells you to stop. In some cases, you may be given additional medicines to help with side effects. Follow all directions for their use. Call your doctor or health care professional for advice if you get a fever, chills or sore throat, or other symptoms of a cold or flu. Do not treat yourself. This drug decreases your body's ability to fight infections. Try to avoid being around people who are sick. This medicine may increase your risk to bruise or bleed. Call your doctor or health care professional if you notice any unusual bleeding. Be careful brushing and flossing your teeth or using a toothpick because you may get an infection or bleed more easily. If you have any dental work done, tell your dentist you are receiving this medicine. Avoid taking products that contain aspirin, acetaminophen, ibuprofen, naproxen, or ketoprofen unless instructed by  your doctor. These medicines may hide a fever. Do not become pregnant while taking this medicine. Women should inform their doctor if they wish to become pregnant or think they might be pregnant. There is a potential for serious side effects to an unborn child. Talk to your health care professional or pharmacist for more information. Do not breast-feed an infant while taking this medicine. What side effects may I notice from receiving this medicine? Side effects that you should report to your doctor or health care professional as soon as possible: -allergic reactions like skin rash, itching or hives, swelling of the face, lips, or tongue -low blood counts - this medicine may decrease the number of white blood cells, red blood cells and platelets. You may be at increased risk for infections and bleeding. -signs of infection - fever or chills, cough, sore throat, pain or difficulty passing urine -signs of decreased platelets or bleeding - bruising, pinpoint red spots on the skin, black, tarry stools, blood in the urine -signs of decreased red blood cells - unusually weak or tired, fainting spells, lightheadedness -breathing problems -changes in vision -mouth or throat sores or ulcers -pain, redness, swelling or irritation at the injection site -pain, tingling, numbness in the hands or feet -redness, blistering, peeling or loosening  of the skin, including inside the mouth -seizures -vomiting Side effects that usually do not require medical attention (report to your doctor or health care professional if they continue or are bothersome): -diarrhea -hair loss -loss of appetite -nausea -stomach pain This list may not describe all possible side effects. Call your doctor for medical advice about side effects. You may report side effects to FDA at 1-800-FDA-1088. Where should I keep my medicine? This drug is given in a hospital or clinic and will not be stored at home. NOTE: This sheet is a summary.  It may not cover all possible information. If you have questions about this medicine, talk to your doctor, pharmacist, or health care provider.  2014, [employer]vier/[employer] Standard. (2008-04-15 17:24:12)

## 2014-05-06 NOTE — Telephone Encounter (Signed)
Pt informed of MRi results.

## 2014-05-07 ENCOUNTER — Ambulatory Visit (HOSPITAL_BASED_OUTPATIENT_CLINIC_OR_DEPARTMENT_OTHER): Payer: Medicare Other

## 2014-05-07 VITALS — BP 179/87 | HR 98 | Temp 98.4°F | Resp 18

## 2014-05-07 DIAGNOSIS — Z5111 Encounter for antineoplastic chemotherapy: Secondary | ICD-10-CM

## 2014-05-07 DIAGNOSIS — C7A1 Malignant poorly differentiated neuroendocrine tumors: Secondary | ICD-10-CM

## 2014-05-07 DIAGNOSIS — C3490 Malignant neoplasm of unspecified part of unspecified bronchus or lung: Secondary | ICD-10-CM

## 2014-05-07 MED ORDER — DEXAMETHASONE SODIUM PHOSPHATE 10 MG/ML IJ SOLN
INTRAMUSCULAR | Status: AC
  Filled 2014-05-07: qty 1

## 2014-05-07 MED ORDER — HEPARIN SOD (PORK) LOCK FLUSH 100 UNIT/ML IV SOLN
500.0000 [IU] | Freq: Once | INTRAVENOUS | Status: AC | PRN
  Administered 2014-05-07: 500 [IU]
  Filled 2014-05-07: qty 5

## 2014-05-07 MED ORDER — ONDANSETRON 8 MG/50ML IVPB (CHCC)
8.0000 mg | Freq: Once | INTRAVENOUS | Status: AC
  Administered 2014-05-07: 8 mg via INTRAVENOUS

## 2014-05-07 MED ORDER — SODIUM CHLORIDE 0.9 % IV SOLN
120.0000 mg/m2 | Freq: Once | INTRAVENOUS | Status: AC
  Administered 2014-05-07: 200 mg via INTRAVENOUS
  Filled 2014-05-07: qty 10

## 2014-05-07 MED ORDER — SODIUM CHLORIDE 0.9 % IV SOLN
Freq: Once | INTRAVENOUS | Status: AC
  Administered 2014-05-07: 12:00:00 via INTRAVENOUS

## 2014-05-07 MED ORDER — ONDANSETRON 8 MG/NS 50 ML IVPB
INTRAVENOUS | Status: AC
  Filled 2014-05-07: qty 8

## 2014-05-07 MED ORDER — DEXAMETHASONE SODIUM PHOSPHATE 10 MG/ML IJ SOLN
10.0000 mg | Freq: Once | INTRAMUSCULAR | Status: AC
  Administered 2014-05-07: 10 mg via INTRAVENOUS

## 2014-05-07 MED ORDER — SODIUM CHLORIDE 0.9 % IJ SOLN
10.0000 mL | INTRAMUSCULAR | Status: DC | PRN
  Administered 2014-05-07: 10 mL
  Filled 2014-05-07: qty 10

## 2014-05-07 NOTE — Patient Instructions (Addendum)
[place] Discharge Instructions for Patients Receiving Chemotherapy  Today you received the following chemotherapy agents: Etoposide  To help prevent nausea and vomiting after your treatment, we encourage you to take your nausea medication.  Take it as often as prescribed.     If you develop nausea and vomiting that is not controlled by your nausea medication, call the clinic. If it is after clinic hours your family physician or the after hours number for the clinic or go to the Emergency Department.   BELOW ARE SYMPTOMS THAT SHOULD BE REPORTED IMMEDIATELY:  *FEVER GREATER THAN 100.5 F  *CHILLS WITH OR WITHOUT FEVER  NAUSEA AND VOMITING THAT IS NOT CONTROLLED WITH YOUR NAUSEA MEDICATION  *UNUSUAL SHORTNESS OF BREATH  *UNUSUAL BRUISING OR BLEEDING  TENDERNESS IN MOUTH AND THROAT WITH OR WITHOUT PRESENCE OF ULCERS  *URINARY PROBLEMS  *BOWEL PROBLEMS  UNUSUAL RASH Items with * indicate a potential emergency and should be followed up as soon as possible.  Feel free to call the clinic you have any questions or concerns. The clinic phone number is [phone].   I have been informed and understand all the instructions given to me. I know to contact the clinic, my physician, or go to the Emergency Department if any problems should occur. I do not have any questions at this time, but understand that I may call the clinic during office hours   should I have any questions or need assistance in obtaining follow up care.    __________________________________________  _____________  __________ Signature of Patient or Authorized Representative            Date                   Time    __________________________________________    Nurse's SignaturePegfilgrastim injection What is this medicine? PEGFILGRASTIM (peg fil GRA stim) helps the body make more white blood cells. It is used to prevent infection in people with low amounts of white blood cells following  cancer treatment. This medicine may be used for other purposes; ask your health care provider or pharmacist if you have questions. COMMON BRAND NAME(S): Neulasta What should I tell my health care provider before I take this medicine? They need to know if you have any of these conditions: -sickle cell disease -an unusual or allergic reaction to pegfilgrastim, filgrastim, E.coli protein, other medicines, foods, dyes, or preservatives -pregnant or trying to get pregnant -breast-feeding How should I use this medicine? This medicine is for injection under the skin. It is usually given by a health care professional in a hospital or clinic setting. If you get this medicine at home, you will be taught how to prepare and give this medicine. Do not shake this medicine. Use exactly as directed. Take your medicine at regular intervals. Do not take your medicine more often than directed. It is important that you put your used needles and syringes in a special sharps container. Do not put them in a trash can. If you do not have a sharps container, call your pharmacist or healthcare provider to get one. Talk to your pediatrician regarding the use of this medicine in children. While this drug may be prescribed for children who weigh more than 45 kg for selected conditions, precautions do apply Overdosage: If you think you have taken too much of this medicine contact a poison control center or emergency room at once. NOTE: This medicine is only for you. Do not share this medicine with  others. What if I miss a dose? If you miss a dose, take it as soon as you can. If it is almost time for your next dose, take only that dose. Do not take double or extra doses. What may interact with this medicine? -lithium -medicines for growth therapy This list may not describe all possible interactions. Give your health care provider a list of all the medicines, herbs, non-prescription drugs, or dietary supplements you use. Also  tell them if you smoke, drink alcohol, or use illegal drugs. Some items may interact with your medicine. What should I watch for while using this medicine? Visit your doctor for regular check ups. You will need important blood work done while you are taking this medicine. What side effects may I notice from receiving this medicine? Side effects that you should report to your doctor or health care professional as soon as possible: -allergic reactions like skin rash, itching or hives, swelling of the face, lips, or tongue -breathing problems -fever -pain, redness, or swelling where injected -shoulder pain -stomach or side pain Side effects that usually do not require medical attention (report to your doctor or health care professional if they continue or are bothersome): -aches, pains -headache -loss of appetite -nausea, vomiting -unusually tired This list may not describe all possible side effects. Call your doctor for medical advice about side effects. You may report side effects to FDA at 1-800-FDA-1088. Where should I keep my medicine? Keep out of the reach of children. Store in a refrigerator between 2 and 8 degrees C (36 and 46 degrees F). Do not freeze. Keep in carton to protect from light. Throw away this medicine if it is left out of the refrigerator for more than 48 hours. Throw away any unused medicine after the expiration date. NOTE: This sheet is a summary. It may not cover all possible information. If you have questions about this medicine, talk to your doctor, pharmacist, or health care provider.  2014, [employer]vier/[employer] Standard. (2008-07-15 15:41:44)

## 2014-05-08 ENCOUNTER — Ambulatory Visit (HOSPITAL_BASED_OUTPATIENT_CLINIC_OR_DEPARTMENT_OTHER): Payer: Medicare Other

## 2014-05-08 VITALS — BP 162/87 | HR 104 | Temp 97.7°F | Resp 20

## 2014-05-08 DIAGNOSIS — C7A1 Malignant poorly differentiated neuroendocrine tumors: Secondary | ICD-10-CM

## 2014-05-08 DIAGNOSIS — C3490 Malignant neoplasm of unspecified part of unspecified bronchus or lung: Secondary | ICD-10-CM

## 2014-05-08 DIAGNOSIS — Z5111 Encounter for antineoplastic chemotherapy: Secondary | ICD-10-CM

## 2014-05-08 MED ORDER — ONDANSETRON 8 MG/NS 50 ML IVPB
INTRAVENOUS | Status: AC
  Filled 2014-05-08: qty 8

## 2014-05-08 MED ORDER — ONDANSETRON 8 MG/50ML IVPB (CHCC)
8.0000 mg | Freq: Once | INTRAVENOUS | Status: AC
  Administered 2014-05-08: 8 mg via INTRAVENOUS

## 2014-05-08 MED ORDER — DEXAMETHASONE SODIUM PHOSPHATE 10 MG/ML IJ SOLN
10.0000 mg | Freq: Once | INTRAMUSCULAR | Status: AC
  Administered 2014-05-08: 10 mg via INTRAVENOUS

## 2014-05-08 MED ORDER — ETOPOSIDE CHEMO INJECTION 1 GM/50ML
120.0000 mg/m2 | Freq: Once | INTRAVENOUS | Status: AC
  Administered 2014-05-08: 200 mg via INTRAVENOUS
  Filled 2014-05-08: qty 10

## 2014-05-08 MED ORDER — DEXAMETHASONE SODIUM PHOSPHATE 10 MG/ML IJ SOLN
INTRAMUSCULAR | Status: AC
  Filled 2014-05-08: qty 1

## 2014-05-08 MED ORDER — SODIUM CHLORIDE 0.9 % IV SOLN
Freq: Once | INTRAVENOUS | Status: AC
  Administered 2014-05-08: 14:00:00 via INTRAVENOUS

## 2014-05-09 ENCOUNTER — Ambulatory Visit (HOSPITAL_BASED_OUTPATIENT_CLINIC_OR_DEPARTMENT_OTHER): Payer: Medicare Other

## 2014-05-09 ENCOUNTER — Telehealth: Payer: Self-pay | Admitting: *Deleted

## 2014-05-09 VITALS — BP 152/83 | HR 101 | Temp 98.0°F

## 2014-05-09 DIAGNOSIS — C3490 Malignant neoplasm of unspecified part of unspecified bronchus or lung: Secondary | ICD-10-CM

## 2014-05-09 DIAGNOSIS — C7A1 Malignant poorly differentiated neuroendocrine tumors: Secondary | ICD-10-CM

## 2014-05-09 DIAGNOSIS — Z5189 Encounter for other specified aftercare: Secondary | ICD-10-CM

## 2014-05-09 MED ORDER — PEGFILGRASTIM INJECTION 6 MG/0.6ML
6.0000 mg | Freq: Once | SUBCUTANEOUS | Status: AC
  Administered 2014-05-09: 6 mg via SUBCUTANEOUS
  Filled 2014-05-09: qty 0.6

## 2014-05-09 NOTE — Patient Instructions (Signed)

## 2014-05-09 NOTE — Telephone Encounter (Signed)
Frances Gonzalez here for Neulasta injection following 1st carbo/vp chemo treatment.  States that she is surprised with how well she feels.  No nausea, vomiting or diarrhea.  Is increasing her fluids and eating well.  All questions answered.  Knows to call if she has any problems or concerns.

## 2014-05-21 ENCOUNTER — Other Ambulatory Visit (HOSPITAL_BASED_OUTPATIENT_CLINIC_OR_DEPARTMENT_OTHER): Payer: 59

## 2014-05-21 ENCOUNTER — Telehealth: Payer: Self-pay | Admitting: Internal Medicine

## 2014-05-21 ENCOUNTER — Encounter: Payer: Self-pay | Admitting: Internal Medicine

## 2014-05-21 ENCOUNTER — Ambulatory Visit (HOSPITAL_BASED_OUTPATIENT_CLINIC_OR_DEPARTMENT_OTHER): Payer: 59 | Admitting: Internal Medicine

## 2014-05-21 VITALS — BP 139/69 | HR 105 | Temp 98.3°F | Resp 18 | Ht 61.0 in | Wt 134.6 lb

## 2014-05-21 DIAGNOSIS — C7A1 Malignant poorly differentiated neuroendocrine tumors: Secondary | ICD-10-CM

## 2014-05-21 DIAGNOSIS — C3490 Malignant neoplasm of unspecified part of unspecified bronchus or lung: Secondary | ICD-10-CM

## 2014-05-21 DIAGNOSIS — J9859 Other diseases of mediastinum, not elsewhere classified: Secondary | ICD-10-CM

## 2014-05-21 LAB — COMPREHENSIVE METABOLIC PANEL (CC13)
ALBUMIN: 3.7 g/dL (ref 3.5–5.0)
ALT: 10 U/L (ref 0–55)
AST: 17 U/L (ref 5–34)
Alkaline Phosphatase: 85 U/L (ref 40–150)
Anion Gap: 12 mEq/L — ABNORMAL HIGH (ref 3–11)
BUN: 5.8 mg/dL — ABNORMAL LOW (ref 7.0–26.0)
CALCIUM: 9 mg/dL (ref 8.4–10.4)
CHLORIDE: 99 meq/L (ref 98–109)
CO2: 23 meq/L (ref 22–29)
Creatinine: 0.8 mg/dL (ref 0.6–1.1)
Glucose: 110 mg/dl (ref 70–140)
POTASSIUM: 3.8 meq/L (ref 3.5–5.1)
Sodium: 134 mEq/L — ABNORMAL LOW (ref 136–145)
Total Bilirubin: 0.34 mg/dL (ref 0.20–1.20)
Total Protein: 6.9 g/dL (ref 6.4–8.3)

## 2014-05-21 LAB — CBC WITH DIFFERENTIAL/PLATELET
BASO%: 0.7 % (ref 0.0–2.0)
Basophils Absolute: 0.1 10*3/uL (ref 0.0–0.1)
EOS ABS: 0 10*3/uL (ref 0.0–0.5)
EOS%: 0.1 % (ref 0.0–7.0)
HEMATOCRIT: 31.8 % — AB (ref 34.8–46.6)
HEMOGLOBIN: 11.2 g/dL — AB (ref 11.6–15.9)
LYMPH%: 12.3 % — ABNORMAL LOW (ref 14.0–49.7)
MCH: 29.7 pg (ref 25.1–34.0)
MCHC: 35.2 g/dL (ref 31.5–36.0)
MCV: 84.4 fL (ref 79.5–101.0)
MONO#: 1.1 10*3/uL — AB (ref 0.1–0.9)
MONO%: 13.9 % (ref 0.0–14.0)
NEUT#: 6 10*3/uL (ref 1.5–6.5)
NEUT%: 73 % (ref 38.4–76.8)
NRBC: 0 % (ref 0–0)
PLATELETS: 61 10*3/uL — AB (ref 145–400)
RBC: 3.77 10*6/uL (ref 3.70–5.45)
RDW: 13.6 % (ref 11.2–14.5)
WBC: 8.2 10*3/uL (ref 3.9–10.3)
lymph#: 1 10*3/uL (ref 0.9–3.3)

## 2014-05-21 NOTE — Progress Notes (Signed)
[place] [phone]   [phone]  OFFICE PROGRESS NOTE  [name], MD [place] [address]  DIAGNOSIS: Extensive stage small cell lung cancer presenting with large mediastinal mass with bilateral mediastinal lymphadenopathy as well as bilateral pulmonary nodules and supraclavicular lymph nodes in addition to questionable malignant pericardial effusion diagnosed in April of 2015.  PRIOR THERAPY:   CURRENT THERAPY: Systemic chemotherapy with carboplatin for AUC of 5 on day 1 and etoposide 120 mg/M2 on days 1, 2 and 3 with Neulasta support on day 4. Status post 1 cycle.  INTERVAL HISTORY: Frances Gonzalez 79 y.o. female returns to the clinic today for followup visit accompanied her sister. The patient is feeling fine today with no specific complaints. She tolerated the first cycle of her systemic chemotherapy with carboplatin and etoposide fairly well with no significant adverse effects. She denied having any significant fever or chills, no nausea or vomiting. She denied having any significant chest pain and noticed improvement in her shortness of breath with no cough or hemoptysis. She is scheduled for cycle #2 of her chemotherapy on 05/27/2014.  MEDICAL HISTORY: Past Medical History  Diagnosis Date  . Hypertension   . Lung cancer     ALLERGIES:  has No Known Allergies.  MEDICATIONS:  Current Outpatient Prescriptions  Medication Sig Dispense Refill  . irbesartan (AVAPRO) 150 MG tablet Take 150 mg by mouth every morning.      . lidocaine-prilocaine (EMLA) cream Apply 1 application topically as needed.  30 g  0  . Multiple Vitamins-Minerals (MULTIVITAMINS THER. W/MINERALS) TABS Take 1 tablet by mouth daily.       . pantoprazole (PROTONIX) 40 MG tablet Take 40 mg by mouth daily.      . simvastatin (ZOCOR) 20 MG tablet Take 10 mg by mouth every evening.      Marland Kitchen tetrahydrozoline 0.05 % ophthalmic solution Place 1  drop into both eyes daily as needed (red eyes).      Marland Kitchen dextromethorphan (DELSYM) 30 MG/5ML liquid Take 60 mg by mouth 2 (two) times daily.       Marland Kitchen loratadine (CLARITIN) 10 MG tablet Take 10 mg by mouth daily as needed for allergies.      Marland Kitchen prochlorperazine (COMPAZINE) 10 MG tablet Take 1 tablet (10 mg total) by mouth every 6 (six) hours as needed for nausea or vomiting.  60 tablet  0   No current facility-administered medications for this visit.    SURGICAL HISTORY:  Past Surgical History  Procedure Laterality Date  . Lung cancer surgery  02/24/04    [facility] Dr Arlyce Dice    REVIEW OF SYSTEMS:  A comprehensive review of systems was negative.   PHYSICAL EXAMINATION: General appearance: alert, cooperative and no distress Head: Normocephalic, without obvious abnormality, atraumatic Neck: no adenopathy, no JVD, supple, symmetrical, trachea midline and thyroid not enlarged, symmetric, no tenderness/mass/nodules Lymph nodes: Cervical, supraclavicular, and axillary nodes normal. Resp: clear to auscultation bilaterally Back: symmetric, no curvature. ROM normal. No CVA tenderness. Cardio: regular rate and rhythm, S1, S2 normal, no murmur, click, rub or gallop GI: soft, non-tender; bowel sounds normal; no masses,  no organomegaly Extremities: extremities normal, atraumatic, no cyanosis or edema  ECOG PERFORMANCE STATUS: 1 - Symptomatic but completely ambulatory  Blood pressure 139/69, pulse 105, temperature 98.3 F (36.8 C), temperature source Oral, resp. rate 18, height 5\' 1"  (1.549 m), weight 134 lb 9.6 oz (61.054 kg).  LABORATORY DATA: Lab  Results  Component Value Date   WBC 8.2 05/21/2014   HGB 11.2* 05/21/2014   HCT 31.8* 05/21/2014   MCV 84.4 05/21/2014   PLT 61* 05/21/2014      Chemistry      Component Value Date/Time   NA 126* 05/06/2014 1202   NA 132* 04/05/2014 1705   K 4.1 05/06/2014 1202   K 4.4 04/05/2014 1705   CL 98 04/05/2014 1705   CO2 24 05/06/2014 1202   CO2 26 04/05/2014 1705     BUN 9.1 05/06/2014 1202   BUN 11 04/05/2014 1705   CREATININE 0.8 05/06/2014 1202   CREATININE 0.6 04/05/2014 1705      Component Value Date/Time   CALCIUM 9.5 05/06/2014 1202   CALCIUM 9.6 04/05/2014 1705   ALKPHOS 85 05/06/2014 1202   ALKPHOS 61 01/16/2013 1411   AST 17 05/06/2014 1202   AST 23 01/16/2013 1411   ALT 8 05/06/2014 1202   ALT 17 01/16/2013 1411   BILITOT 0.63 05/06/2014 1202   BILITOT 0.6 01/16/2013 1411       RADIOGRAPHIC STUDIES: Mr Jeri Cos Wo Contrast  May 22, 2014   CLINICAL DATA:  Initial encounter small cell lung cancer. The patient does have a remote history of lung cancer is well.  EXAM: MRI HEAD WITHOUT AND WITH CONTRAST  TECHNIQUE: Multiplanar, multiecho pulse sequences of the brain and surrounding structures were obtained without and with intravenous contrast.  CONTRAST:  1mL MULTIHANCE GADOBENATE DIMEGLUMINE 529 MG/ML IV SOLN  COMPARISON:  None.  FINDINGS: No acute infarct, hemorrhage, or mass lesion is present.  The postcontrast images demonstrate no pathologic enhancement to suggest metastatic disease of the brain or meninges.  Mild generalized atrophy is present. Mild periventricular and subcortical white matter changes are present bilaterally with some asymmetry in the posterior left frontal lobe.  Flow is present in the major intracranial arteries. The globes and orbits are intact. The paranasal sinuses and mastoid air cells are clear.  IMPRESSION: 1. No evidence for metastatic disease to the brain. 2. Atrophy and white matter disease is somewhat advanced for age. This is nonspecific, but likely reflects the sequela of chronic microvascular ischemia.   Electronically Signed   By: Lawrence Santiago M.D.   On: May 22, 2014 10:34   US Biopsy  04/23/2014   CLINICAL DATA:  Abnormal adenopathy  EXAM: ULTRASOUND-GUIDED BIOPSY OF A RIGHT SUPRACLAVICULAR LYMPH NODE. CORE.  MEDICATIONS AND MEDICAL HISTORY: None  ANESTHESIA/SEDATION: None  PROCEDURE: The procedure, risks, benefits, and  alternatives were explained to the patient. Questions regarding the procedure were encouraged and answered. The patient understands and consents to the procedure.  The right neck was prepped with Betadine in a sterile fashion, and a sterile drape was applied covering the operative field. A sterile gown and sterile gloves were used for the procedure.  Under sonographic guidance, 5 18 gauge core biopsies of the enlarged right supraclavicular lymph node were obtained. Final imaging was performed.  FINDINGS: The images document guide needle placement within the enlarged right supraclavicular lymph. Post biopsy images demonstrate no amber.  IMPRESSION: Successful ultrasound-guided core biopsy of an enlarged right supraclavicular lymph node   Electronically Signed   By: Maryclare Bean M.D.   On: 04/23/2014 15:53   Ir Fluoro Guide Cv Line Right  05/03/2014   CLINICAL DATA:  Metastatic small-cell lung carcinoma. The patient requires a porta cath to begin chemotherapy.  EXAM: IMPLANTED PORT A CATH PLACEMENT WITH ULTRASOUND AND FLUOROSCOPIC GUIDANCE  ANESTHESIA/SEDATION: 2.0 mg IV Versed;  100 mcg IV Fentanyl.  Total Moderate Sedation Time  30 minutes.  MEDICATIONS: 2 g IV Ancef. As antibiotic prophylaxis, Ancef was ordered pre-procedure and administered intravenously within one hour of incision.  FLUOROSCOPY TIME:  6 seconds.  PROCEDURE: The procedure, risks, benefits, and alternatives were explained to the patient. Questions regarding the procedure were encouraged and answered. The patient understands and consents to the procedure.  The right neck and chest were prepped with chlorhexidine in a sterile fashion, and a sterile drape was applied covering the operative field. Maximum barrier sterile technique with sterile gowns and gloves were used for the procedure. Local anesthesia was provided with 1% lidocaine.  Ultrasound was used to confirm patency of the right internal jugular vein. After creating a small venotomy incision, a  21 gauge needle was advanced into the right internal jugular vein under direct, real-time ultrasound guidance. Ultrasound image documentation was performed. After securing guidewire access, an 8 Fr dilator was placed. A J-wire was kinked to measure appropriate catheter length.  A subcutaneous port pocket was then created along the upper chest wall utilizing sharp and blunt dissection. Portable cautery was utilized. The pocket was irrigated with sterile saline.  A single lumen power injectable port was chosen for placement. The 8 Fr catheter was tunneled from the port pocket site to the venotomy incision. The port was placed in the pocket. External catheter was trimmed to appropriate length based on guidewire measurement.  At the venotomy, an 8 Fr peel-away sheath was placed over a guidewire. The catheter was then placed through the sheath and the sheath removed. Final catheter positioning was confirmed and documented with a fluoroscopic spot image. The port was accessed with a needle and aspirated and flushed with heparinized saline. The needle was removed.  The venotomy and port pocket incisions were closed with subcutaneous 3-0 Monocryl and subcuticular 4-0 Vicryl. Dermabond was applied to both incisions.  Complications: None. No pneumothorax.  FINDINGS: After catheter placement, the tip lies at the cavoatrial junction. The catheter aspirates normally and is ready for immediate use.  IMPRESSION: Placement of single lumen port a cath via right internal jugular vein. The catheter tip lies at the cavoatrial junction. A power injectable port a cath was placed and is ready for immediate use.   Electronically Signed   By: Aletta Edouard M.D.   On: 05/03/2014 16:08   Ir US Guide Vasc Access Right  05/03/2014   CLINICAL DATA:  Metastatic small-cell lung carcinoma. The patient requires a porta cath to begin chemotherapy.  EXAM: IMPLANTED PORT A CATH PLACEMENT WITH ULTRASOUND AND FLUOROSCOPIC GUIDANCE   ANESTHESIA/SEDATION: 2.0 mg IV Versed; 100 mcg IV Fentanyl.  Total Moderate Sedation Time  30 minutes.  MEDICATIONS: 2 g IV Ancef. As antibiotic prophylaxis, Ancef was ordered pre-procedure and administered intravenously within one hour of incision.  FLUOROSCOPY TIME:  6 seconds.  PROCEDURE: The procedure, risks, benefits, and alternatives were explained to the patient. Questions regarding the procedure were encouraged and answered. The patient understands and consents to the procedure.  The right neck and chest were prepped with chlorhexidine in a sterile fashion, and a sterile drape was applied covering the operative field. Maximum barrier sterile technique with sterile gowns and gloves were used for the procedure. Local anesthesia was provided with 1% lidocaine.  Ultrasound was used to confirm patency of the right internal jugular vein. After creating a small venotomy incision, a 21 gauge needle was advanced into the right internal jugular vein under direct, real-time ultrasound  guidance. Ultrasound image documentation was performed. After securing guidewire access, an 8 Fr dilator was placed. A J-wire was kinked to measure appropriate catheter length.  A subcutaneous port pocket was then created along the upper chest wall utilizing sharp and blunt dissection. Portable cautery was utilized. The pocket was irrigated with sterile saline.  A single lumen power injectable port was chosen for placement. The 8 Fr catheter was tunneled from the port pocket site to the venotomy incision. The port was placed in the pocket. External catheter was trimmed to appropriate length based on guidewire measurement.  At the venotomy, an 8 Fr peel-away sheath was placed over a guidewire. The catheter was then placed through the sheath and the sheath removed. Final catheter positioning was confirmed and documented with a fluoroscopic spot image. The port was accessed with a needle and aspirated and flushed with heparinized saline. The  needle was removed.  The venotomy and port pocket incisions were closed with subcutaneous 3-0 Monocryl and subcuticular 4-0 Vicryl. Dermabond was applied to both incisions.  Complications: None. No pneumothorax.  FINDINGS: After catheter placement, the tip lies at the cavoatrial junction. The catheter aspirates normally and is ready for immediate use.  IMPRESSION: Placement of single lumen port a cath via right internal jugular vein. The catheter tip lies at the cavoatrial junction. A power injectable port a cath was placed and is ready for immediate use.   Electronically Signed   By: Aletta Edouard M.D.   On: 05/03/2014 16:08    ASSESSMENT AND PLAN: This is a very pleasant 79 years old white female recently diagnosed with extensive stage small cell lung cancer currently undergoing systemic chemotherapy with carboplatin and etoposide status post 1 cycle. The patient tolerated the first cycle of her treatment well. I recommended for her to proceed with the second cycle as scheduled next week. She would come back for followup visit in 4 weeks after repeating CT scan of the chest, abdomen and pelvis for restaging of her disease. She was advised to call immediately if she has any concerning symptoms in the interval.  The patient voices understanding of current disease status and treatment options and is in agreement with the current care plan.  All questions were answered. The patient knows to call the clinic with any problems, questions or concerns. We can certainly see the patient much sooner if necessary.  Disclaimer: This note was dictated with voice recognition software. Similar sounding words can inadvertently be transcribed and may not be corrected upon review.

## 2014-05-21 NOTE — Telephone Encounter (Signed)
gv adn printed appt sched adna vs for pt for June....sed added tx.Marland KitchenMarland Kitchenpt wanted to keep wend.

## 2014-05-23 ENCOUNTER — Telehealth: Payer: Self-pay | Admitting: Internal Medicine

## 2014-05-23 NOTE — Telephone Encounter (Signed)
pt called to cx md visit done pt aware of new time

## 2014-05-29 ENCOUNTER — Ambulatory Visit (HOSPITAL_BASED_OUTPATIENT_CLINIC_OR_DEPARTMENT_OTHER): Payer: 59

## 2014-05-29 ENCOUNTER — Other Ambulatory Visit (HOSPITAL_BASED_OUTPATIENT_CLINIC_OR_DEPARTMENT_OTHER): Payer: 59

## 2014-05-29 ENCOUNTER — Ambulatory Visit: Payer: Medicare Other | Admitting: Internal Medicine

## 2014-05-29 VITALS — BP 116/73 | HR 103 | Temp 97.4°F | Resp 19

## 2014-05-29 DIAGNOSIS — C3490 Malignant neoplasm of unspecified part of unspecified bronchus or lung: Secondary | ICD-10-CM

## 2014-05-29 DIAGNOSIS — Z5111 Encounter for antineoplastic chemotherapy: Secondary | ICD-10-CM

## 2014-05-29 DIAGNOSIS — C7A1 Malignant poorly differentiated neuroendocrine tumors: Secondary | ICD-10-CM

## 2014-05-29 DIAGNOSIS — J9859 Other diseases of mediastinum, not elsewhere classified: Secondary | ICD-10-CM

## 2014-05-29 LAB — COMPREHENSIVE METABOLIC PANEL (CC13)
ALBUMIN: 3.6 g/dL (ref 3.5–5.0)
ALK PHOS: 83 U/L (ref 40–150)
ALT: 8 U/L (ref 0–55)
AST: 16 U/L (ref 5–34)
Anion Gap: 15 mEq/L — ABNORMAL HIGH (ref 3–11)
BUN: 8.1 mg/dL (ref 7.0–26.0)
CO2: 19 mEq/L — ABNORMAL LOW (ref 22–29)
Calcium: 9.1 mg/dL (ref 8.4–10.4)
Chloride: 103 mEq/L (ref 98–109)
Creatinine: 0.8 mg/dL (ref 0.6–1.1)
Glucose: 96 mg/dl (ref 70–140)
Potassium: 4.4 mEq/L (ref 3.5–5.1)
Sodium: 136 mEq/L (ref 136–145)
Total Bilirubin: 0.42 mg/dL (ref 0.20–1.20)
Total Protein: 7.1 g/dL (ref 6.4–8.3)

## 2014-05-29 LAB — CBC WITH DIFFERENTIAL/PLATELET
BASO%: 0.8 % (ref 0.0–2.0)
BASOS ABS: 0.1 10*3/uL (ref 0.0–0.1)
EOS%: 0.3 % (ref 0.0–7.0)
Eosinophils Absolute: 0 10*3/uL (ref 0.0–0.5)
HCT: 32.3 % — ABNORMAL LOW (ref 34.8–46.6)
HGB: 11.3 g/dL — ABNORMAL LOW (ref 11.6–15.9)
LYMPH%: 11.7 % — ABNORMAL LOW (ref 14.0–49.7)
MCH: 30.2 pg (ref 25.1–34.0)
MCHC: 35.2 g/dL (ref 31.5–36.0)
MCV: 85.8 fL (ref 79.5–101.0)
MONO#: 1.1 10*3/uL — ABNORMAL HIGH (ref 0.1–0.9)
MONO%: 14.7 % — AB (ref 0.0–14.0)
NEUT#: 5.3 10*3/uL (ref 1.5–6.5)
NEUT%: 72.5 % (ref 38.4–76.8)
Platelets: 317 10*3/uL (ref 145–400)
RBC: 3.76 10*6/uL (ref 3.70–5.45)
RDW: 13.8 % (ref 11.2–14.5)
WBC: 7.3 10*3/uL (ref 3.9–10.3)
lymph#: 0.9 10*3/uL (ref 0.9–3.3)

## 2014-05-29 MED ORDER — ONDANSETRON 16 MG/50ML IVPB (CHCC)
INTRAVENOUS | Status: AC
  Filled 2014-05-29: qty 16

## 2014-05-29 MED ORDER — DEXAMETHASONE SODIUM PHOSPHATE 20 MG/5ML IJ SOLN
20.0000 mg | Freq: Once | INTRAMUSCULAR | Status: AC
Start: 2014-05-29 — End: 2014-05-29
  Administered 2014-05-29: 20 mg via INTRAVENOUS

## 2014-05-29 MED ORDER — CARBOPLATIN CHEMO INJECTION 450 MG/45ML
359.0000 mg | Freq: Once | INTRAVENOUS | Status: AC
  Administered 2014-05-29: 360 mg via INTRAVENOUS
  Filled 2014-05-29: qty 36

## 2014-05-29 MED ORDER — SODIUM CHLORIDE 0.9 % IV SOLN
Freq: Once | INTRAVENOUS | Status: AC
  Administered 2014-05-29: 10:00:00 via INTRAVENOUS

## 2014-05-29 MED ORDER — SODIUM CHLORIDE 0.9 % IJ SOLN
10.0000 mL | INTRAMUSCULAR | Status: DC | PRN
  Administered 2014-05-29: 10 mL
  Filled 2014-05-29: qty 10

## 2014-05-29 MED ORDER — HEPARIN SOD (PORK) LOCK FLUSH 100 UNIT/ML IV SOLN
500.0000 [IU] | Freq: Once | INTRAVENOUS | Status: AC | PRN
  Administered 2014-05-29: 500 [IU]
  Filled 2014-05-29: qty 5

## 2014-05-29 MED ORDER — SODIUM CHLORIDE 0.9 % IV SOLN
120.0000 mg/m2 | Freq: Once | INTRAVENOUS | Status: AC
  Administered 2014-05-29: 200 mg via INTRAVENOUS
  Filled 2014-05-29: qty 10

## 2014-05-29 MED ORDER — ONDANSETRON 16 MG/50ML IVPB (CHCC)
16.0000 mg | Freq: Once | INTRAVENOUS | Status: AC
  Administered 2014-05-29: 16 mg via INTRAVENOUS

## 2014-05-29 MED ORDER — DEXAMETHASONE SODIUM PHOSPHATE 20 MG/5ML IJ SOLN
INTRAMUSCULAR | Status: AC
  Filled 2014-05-29: qty 5

## 2014-05-29 NOTE — Patient Instructions (Signed)
[facility] Discharge Instructions for Patients Receiving Chemotherapy  Today you received the following chemotherapy agents: Carboplatin, Etoposide.   To help prevent nausea and vomiting after your treatment, we encourage you to take your nausea medication: Compazine 10 mg every 6 hrs as needed for nausea.    If you develop nausea and vomiting that is not controlled by your nausea medication, call the clinic.   BELOW ARE SYMPTOMS THAT SHOULD BE REPORTED IMMEDIATELY:  *FEVER GREATER THAN 100.5 F  *CHILLS WITH OR WITHOUT FEVER  NAUSEA AND VOMITING THAT IS NOT CONTROLLED WITH YOUR NAUSEA MEDICATION  *UNUSUAL SHORTNESS OF BREATH  *UNUSUAL BRUISING OR BLEEDING  TENDERNESS IN MOUTH AND THROAT WITH OR WITHOUT PRESENCE OF ULCERS  *URINARY PROBLEMS  *BOWEL PROBLEMS  UNUSUAL RASH Items with * indicate a potential emergency and should be followed up as soon as possible.  Feel free to call the clinic you have any questions or concerns. The clinic phone number is [phone].

## 2014-05-30 ENCOUNTER — Ambulatory Visit (HOSPITAL_BASED_OUTPATIENT_CLINIC_OR_DEPARTMENT_OTHER): Payer: 59

## 2014-05-30 VITALS — BP 150/78 | HR 105 | Temp 97.4°F | Resp 18

## 2014-05-30 DIAGNOSIS — C3490 Malignant neoplasm of unspecified part of unspecified bronchus or lung: Secondary | ICD-10-CM

## 2014-05-30 DIAGNOSIS — Z5111 Encounter for antineoplastic chemotherapy: Secondary | ICD-10-CM

## 2014-05-30 MED ORDER — DEXAMETHASONE SODIUM PHOSPHATE 10 MG/ML IJ SOLN
10.0000 mg | Freq: Once | INTRAMUSCULAR | Status: AC
  Administered 2014-05-30: 10 mg via INTRAVENOUS

## 2014-05-30 MED ORDER — ONDANSETRON 8 MG/NS 50 ML IVPB
INTRAVENOUS | Status: AC
  Filled 2014-05-30: qty 8

## 2014-05-30 MED ORDER — DEXAMETHASONE SODIUM PHOSPHATE 10 MG/ML IJ SOLN
INTRAMUSCULAR | Status: AC
  Filled 2014-05-30: qty 1

## 2014-05-30 MED ORDER — ONDANSETRON 8 MG/50ML IVPB (CHCC)
8.0000 mg | Freq: Once | INTRAVENOUS | Status: AC
  Administered 2014-05-30: 8 mg via INTRAVENOUS

## 2014-05-30 MED ORDER — HEPARIN SOD (PORK) LOCK FLUSH 100 UNIT/ML IV SOLN
500.0000 [IU] | Freq: Once | INTRAVENOUS | Status: AC | PRN
  Administered 2014-05-30: 500 [IU]
  Filled 2014-05-30: qty 5

## 2014-05-30 MED ORDER — SODIUM CHLORIDE 0.9 % IJ SOLN
10.0000 mL | INTRAMUSCULAR | Status: DC | PRN
  Administered 2014-05-30: 10 mL
  Filled 2014-05-30: qty 10

## 2014-05-30 MED ORDER — SODIUM CHLORIDE 0.9 % IV SOLN
120.0000 mg/m2 | Freq: Once | INTRAVENOUS | Status: AC
  Administered 2014-05-30: 200 mg via INTRAVENOUS
  Filled 2014-05-30: qty 10

## 2014-05-30 MED ORDER — SODIUM CHLORIDE 0.9 % IV SOLN
Freq: Once | INTRAVENOUS | Status: AC
  Administered 2014-05-30: 15:00:00 via INTRAVENOUS

## 2014-05-30 NOTE — Patient Instructions (Signed)
[facility] Discharge Instructions for Patients Receiving Chemotherapy  Today you received the following chemotherapy agents Etoposide. To help prevent nausea and vomiting after your treatment, we encourage you to take your nausea medication as directed.    If you develop nausea and vomiting that is not controlled by your nausea medication, call the clinic.   BELOW ARE SYMPTOMS THAT SHOULD BE REPORTED IMMEDIATELY:  *FEVER GREATER THAN 100.5 F  *CHILLS WITH OR WITHOUT FEVER  NAUSEA AND VOMITING THAT IS NOT CONTROLLED WITH YOUR NAUSEA MEDICATION  *UNUSUAL SHORTNESS OF BREATH  *UNUSUAL BRUISING OR BLEEDING  TENDERNESS IN MOUTH AND THROAT WITH OR WITHOUT PRESENCE OF ULCERS  *URINARY PROBLEMS  *BOWEL PROBLEMS  UNUSUAL RASH Items with * indicate a potential emergency and should be followed up as soon as possible.  Feel free to call the clinic you have any questions or concerns. The clinic phone number is [phone].

## 2014-05-31 ENCOUNTER — Ambulatory Visit (HOSPITAL_BASED_OUTPATIENT_CLINIC_OR_DEPARTMENT_OTHER): Payer: 59

## 2014-05-31 VITALS — BP 114/75 | HR 103 | Temp 97.4°F | Resp 18

## 2014-05-31 DIAGNOSIS — C7A1 Malignant poorly differentiated neuroendocrine tumors: Secondary | ICD-10-CM

## 2014-05-31 DIAGNOSIS — Z5111 Encounter for antineoplastic chemotherapy: Secondary | ICD-10-CM

## 2014-05-31 DIAGNOSIS — C3490 Malignant neoplasm of unspecified part of unspecified bronchus or lung: Secondary | ICD-10-CM

## 2014-05-31 MED ORDER — DEXAMETHASONE SODIUM PHOSPHATE 10 MG/ML IJ SOLN
10.0000 mg | Freq: Once | INTRAMUSCULAR | Status: AC
  Administered 2014-05-31: 10 mg via INTRAVENOUS

## 2014-05-31 MED ORDER — SODIUM CHLORIDE 0.9 % IV SOLN
Freq: Once | INTRAVENOUS | Status: AC
  Administered 2014-05-31: 15:00:00 via INTRAVENOUS

## 2014-05-31 MED ORDER — ONDANSETRON 8 MG/50ML IVPB (CHCC)
8.0000 mg | Freq: Once | INTRAVENOUS | Status: AC
  Administered 2014-05-31: 8 mg via INTRAVENOUS

## 2014-05-31 MED ORDER — SODIUM CHLORIDE 0.9 % IV SOLN
120.0000 mg/m2 | Freq: Once | INTRAVENOUS | Status: AC
  Administered 2014-05-31: 200 mg via INTRAVENOUS
  Filled 2014-05-31: qty 10

## 2014-05-31 MED ORDER — ONDANSETRON 8 MG/NS 50 ML IVPB
INTRAVENOUS | Status: AC
  Filled 2014-05-31: qty 8

## 2014-05-31 MED ORDER — HEPARIN SOD (PORK) LOCK FLUSH 100 UNIT/ML IV SOLN
500.0000 [IU] | Freq: Once | INTRAVENOUS | Status: AC | PRN
  Administered 2014-05-31: 500 [IU]
  Filled 2014-05-31: qty 5

## 2014-05-31 MED ORDER — DEXAMETHASONE SODIUM PHOSPHATE 10 MG/ML IJ SOLN
INTRAMUSCULAR | Status: AC
  Filled 2014-05-31: qty 1

## 2014-05-31 MED ORDER — SODIUM CHLORIDE 0.9 % IJ SOLN
10.0000 mL | INTRAMUSCULAR | Status: DC | PRN
  Administered 2014-05-31: 10 mL
  Filled 2014-05-31: qty 10

## 2014-05-31 NOTE — Patient Instructions (Signed)
[facility] Discharge Instructions for Patients Receiving Chemotherapy  Today you received the following chemotherapy agents Etoposide.  To help prevent nausea and vomiting after your treatment, we encourage you to take your nausea medication.   If you develop nausea and vomiting that is not controlled by your nausea medication, call the clinic.   BELOW ARE SYMPTOMS THAT SHOULD BE REPORTED IMMEDIATELY:  *FEVER GREATER THAN 100.5 F  *CHILLS WITH OR WITHOUT FEVER  NAUSEA AND VOMITING THAT IS NOT CONTROLLED WITH YOUR NAUSEA MEDICATION  *UNUSUAL SHORTNESS OF BREATH  *UNUSUAL BRUISING OR BLEEDING  TENDERNESS IN MOUTH AND THROAT WITH OR WITHOUT PRESENCE OF ULCERS  *URINARY PROBLEMS  *BOWEL PROBLEMS  UNUSUAL RASH Items with * indicate a potential emergency and should be followed up as soon as possible.  Feel free to call the clinic you have any questions or concerns. The clinic phone number is [phone].

## 2014-06-01 ENCOUNTER — Ambulatory Visit (HOSPITAL_BASED_OUTPATIENT_CLINIC_OR_DEPARTMENT_OTHER): Payer: 59

## 2014-06-01 VITALS — BP 136/85 | HR 99 | Temp 97.4°F | Resp 16

## 2014-06-01 DIAGNOSIS — C3490 Malignant neoplasm of unspecified part of unspecified bronchus or lung: Secondary | ICD-10-CM

## 2014-06-01 DIAGNOSIS — C7A1 Malignant poorly differentiated neuroendocrine tumors: Secondary | ICD-10-CM

## 2014-06-01 DIAGNOSIS — Z5189 Encounter for other specified aftercare: Secondary | ICD-10-CM

## 2014-06-01 MED ORDER — PEGFILGRASTIM INJECTION 6 MG/0.6ML
6.0000 mg | Freq: Once | SUBCUTANEOUS | Status: AC
  Administered 2014-06-01: 6 mg via SUBCUTANEOUS

## 2014-06-01 NOTE — Progress Notes (Signed)
Patient refused AVS. 

## 2014-06-01 NOTE — Patient Instructions (Signed)

## 2014-06-05 ENCOUNTER — Other Ambulatory Visit (HOSPITAL_BASED_OUTPATIENT_CLINIC_OR_DEPARTMENT_OTHER): Payer: 59

## 2014-06-05 DIAGNOSIS — C7A1 Malignant poorly differentiated neuroendocrine tumors: Secondary | ICD-10-CM

## 2014-06-05 DIAGNOSIS — J9859 Other diseases of mediastinum, not elsewhere classified: Secondary | ICD-10-CM

## 2014-06-05 LAB — COMPREHENSIVE METABOLIC PANEL (CC13)
ALBUMIN: 3.6 g/dL (ref 3.5–5.0)
ALK PHOS: 109 U/L (ref 40–150)
ALT: 10 U/L (ref 0–55)
AST: 16 U/L (ref 5–34)
Anion Gap: 9 mEq/L (ref 3–11)
BILIRUBIN TOTAL: 0.46 mg/dL (ref 0.20–1.20)
BUN: 20.5 mg/dL (ref 7.0–26.0)
CO2: 25 mEq/L (ref 22–29)
Calcium: 9.2 mg/dL (ref 8.4–10.4)
Chloride: 96 mEq/L — ABNORMAL LOW (ref 98–109)
Creatinine: 0.8 mg/dL (ref 0.6–1.1)
Glucose: 90 mg/dl (ref 70–140)
POTASSIUM: 4.3 meq/L (ref 3.5–5.1)
Sodium: 130 mEq/L — ABNORMAL LOW (ref 136–145)
Total Protein: 6.8 g/dL (ref 6.4–8.3)

## 2014-06-05 LAB — CBC WITH DIFFERENTIAL/PLATELET
BASO%: 0.4 % (ref 0.0–2.0)
Basophils Absolute: 0 10*3/uL (ref 0.0–0.1)
EOS%: 0.1 % (ref 0.0–7.0)
Eosinophils Absolute: 0 10*3/uL (ref 0.0–0.5)
HCT: 30.2 % — ABNORMAL LOW (ref 34.8–46.6)
HGB: 10.4 g/dL — ABNORMAL LOW (ref 11.6–15.9)
LYMPH%: 6.2 % — ABNORMAL LOW (ref 14.0–49.7)
MCH: 30 pg (ref 25.1–34.0)
MCHC: 34.5 g/dL (ref 31.5–36.0)
MCV: 87 fL (ref 79.5–101.0)
MONO#: 0.1 10*3/uL (ref 0.1–0.9)
MONO%: 0.5 % (ref 0.0–14.0)
NEUT#: 10.2 10*3/uL — ABNORMAL HIGH (ref 1.5–6.5)
NEUT%: 92.8 % — ABNORMAL HIGH (ref 38.4–76.8)
Platelets: 270 10*3/uL (ref 145–400)
RBC: 3.47 10*6/uL — ABNORMAL LOW (ref 3.70–5.45)
RDW: 14 % (ref 11.2–14.5)
WBC: 11.1 10*3/uL — ABNORMAL HIGH (ref 3.9–10.3)
lymph#: 0.7 10*3/uL — ABNORMAL LOW (ref 0.9–3.3)

## 2014-06-12 ENCOUNTER — Encounter (HOSPITAL_COMMUNITY): Payer: Self-pay

## 2014-06-12 ENCOUNTER — Ambulatory Visit (HOSPITAL_COMMUNITY)
Admission: RE | Admit: 2014-06-12 | Discharge: 2014-06-12 | Disposition: A | Discharge status: Home / Self Care | Payer: Medicare Other | Source: Ambulatory Visit | Attending: Internal Medicine | Admitting: Internal Medicine

## 2014-06-12 ENCOUNTER — Other Ambulatory Visit (HOSPITAL_BASED_OUTPATIENT_CLINIC_OR_DEPARTMENT_OTHER): Payer: 59

## 2014-06-12 DIAGNOSIS — C3490 Malignant neoplasm of unspecified part of unspecified bronchus or lung: Secondary | ICD-10-CM

## 2014-06-12 DIAGNOSIS — J438 Other emphysema: Secondary | ICD-10-CM | POA: Insufficient documentation

## 2014-06-12 DIAGNOSIS — E279 Disorder of adrenal gland, unspecified: Secondary | ICD-10-CM | POA: Insufficient documentation

## 2014-06-12 DIAGNOSIS — K449 Diaphragmatic hernia without obstruction or gangrene: Secondary | ICD-10-CM | POA: Insufficient documentation

## 2014-06-12 DIAGNOSIS — J9859 Other diseases of mediastinum, not elsewhere classified: Secondary | ICD-10-CM

## 2014-06-12 DIAGNOSIS — I319 Disease of pericardium, unspecified: Secondary | ICD-10-CM | POA: Insufficient documentation

## 2014-06-12 DIAGNOSIS — R599 Enlarged lymph nodes, unspecified: Secondary | ICD-10-CM | POA: Insufficient documentation

## 2014-06-12 DIAGNOSIS — Z9221 Personal history of antineoplastic chemotherapy: Secondary | ICD-10-CM | POA: Insufficient documentation

## 2014-06-12 LAB — COMPREHENSIVE METABOLIC PANEL (CC13)
ALK PHOS: 110 U/L (ref 40–150)
ALT: 10 U/L (ref 0–55)
AST: 20 U/L (ref 5–34)
Albumin: 3.7 g/dL (ref 3.5–5.0)
Anion Gap: 10 mEq/L (ref 3–11)
BILIRUBIN TOTAL: 0.37 mg/dL (ref 0.20–1.20)
BUN: 7.6 mg/dL (ref 7.0–26.0)
CO2: 23 meq/L (ref 22–29)
CREATININE: 0.8 mg/dL (ref 0.6–1.1)
Calcium: 8.9 mg/dL (ref 8.4–10.4)
Chloride: 99 mEq/L (ref 98–109)
GLUCOSE: 102 mg/dL (ref 70–140)
Potassium: 4.8 mEq/L (ref 3.5–5.1)
Sodium: 132 mEq/L — ABNORMAL LOW (ref 136–145)
Total Protein: 7 g/dL (ref 6.4–8.3)

## 2014-06-12 LAB — CBC WITH DIFFERENTIAL/PLATELET
BASO%: 0.2 % (ref 0.0–2.0)
BASOS ABS: 0.1 10*3/uL (ref 0.0–0.1)
EOS ABS: 0 10*3/uL (ref 0.0–0.5)
EOS%: 0.1 % (ref 0.0–7.0)
HEMATOCRIT: 29.1 % — AB (ref 34.8–46.6)
HGB: 10 g/dL — ABNORMAL LOW (ref 11.6–15.9)
LYMPH%: 5.1 % — AB (ref 14.0–49.7)
MCH: 29.5 pg (ref 25.1–34.0)
MCHC: 34.3 g/dL (ref 31.5–36.0)
MCV: 86.1 fL (ref 79.5–101.0)
MONO#: 0.9 10*3/uL (ref 0.1–0.9)
MONO%: 3.7 % (ref 0.0–14.0)
NEUT%: 90.9 % — AB (ref 38.4–76.8)
NEUTROS ABS: 21.7 10*3/uL — AB (ref 1.5–6.5)
PLATELETS: 68 10*3/uL — AB (ref 145–400)
RBC: 3.38 10*6/uL — ABNORMAL LOW (ref 3.70–5.45)
RDW: 14.1 % (ref 11.2–14.5)
WBC: 23.9 10*3/uL — ABNORMAL HIGH (ref 3.9–10.3)
lymph#: 1.2 10*3/uL (ref 0.9–3.3)

## 2014-06-12 MED ORDER — IOHEXOL 300 MG/ML  SOLN
100.0000 mL | Freq: Once | INTRAMUSCULAR | Status: AC | PRN
  Administered 2014-06-12: 100 mL via INTRAVENOUS

## 2014-06-19 ENCOUNTER — Encounter: Payer: Self-pay | Admitting: Internal Medicine

## 2014-06-19 ENCOUNTER — Ambulatory Visit (HOSPITAL_BASED_OUTPATIENT_CLINIC_OR_DEPARTMENT_OTHER): Payer: 59 | Admitting: Internal Medicine

## 2014-06-19 ENCOUNTER — Ambulatory Visit (HOSPITAL_BASED_OUTPATIENT_CLINIC_OR_DEPARTMENT_OTHER): Payer: 59

## 2014-06-19 ENCOUNTER — Telehealth: Payer: Self-pay | Admitting: Internal Medicine

## 2014-06-19 ENCOUNTER — Telehealth: Payer: Self-pay | Admitting: *Deleted

## 2014-06-19 ENCOUNTER — Other Ambulatory Visit (HOSPITAL_BASED_OUTPATIENT_CLINIC_OR_DEPARTMENT_OTHER): Payer: 59

## 2014-06-19 VITALS — BP 115/77 | HR 115 | Temp 97.5°F | Resp 18 | Ht 61.0 in | Wt 134.3 lb

## 2014-06-19 DIAGNOSIS — R5381 Other malaise: Secondary | ICD-10-CM

## 2014-06-19 DIAGNOSIS — Z5111 Encounter for antineoplastic chemotherapy: Secondary | ICD-10-CM

## 2014-06-19 DIAGNOSIS — C7A1 Malignant poorly differentiated neuroendocrine tumors: Secondary | ICD-10-CM

## 2014-06-19 DIAGNOSIS — R5383 Other fatigue: Secondary | ICD-10-CM

## 2014-06-19 DIAGNOSIS — C3490 Malignant neoplasm of unspecified part of unspecified bronchus or lung: Secondary | ICD-10-CM

## 2014-06-19 DIAGNOSIS — D6481 Anemia due to antineoplastic chemotherapy: Secondary | ICD-10-CM

## 2014-06-19 DIAGNOSIS — R0602 Shortness of breath: Secondary | ICD-10-CM

## 2014-06-19 DIAGNOSIS — J9859 Other diseases of mediastinum, not elsewhere classified: Secondary | ICD-10-CM

## 2014-06-19 DIAGNOSIS — T451X5A Adverse effect of antineoplastic and immunosuppressive drugs, initial encounter: Secondary | ICD-10-CM

## 2014-06-19 LAB — COMPREHENSIVE METABOLIC PANEL (CC13)
ALT: 8 U/L (ref 0–55)
AST: 16 U/L (ref 5–34)
Albumin: 3.7 g/dL (ref 3.5–5.0)
Alkaline Phosphatase: 92 U/L (ref 40–150)
Anion Gap: 9 mEq/L (ref 3–11)
BUN: 9.4 mg/dL (ref 7.0–26.0)
CO2: 24 mEq/L (ref 22–29)
CREATININE: 0.8 mg/dL (ref 0.6–1.1)
Calcium: 8.6 mg/dL (ref 8.4–10.4)
Chloride: 104 mEq/L (ref 98–109)
Glucose: 103 mg/dl (ref 70–140)
Potassium: 4 mEq/L (ref 3.5–5.1)
Sodium: 136 mEq/L (ref 136–145)
Total Bilirubin: 0.4 mg/dL (ref 0.20–1.20)
Total Protein: 7.1 g/dL (ref 6.4–8.3)

## 2014-06-19 LAB — CBC WITH DIFFERENTIAL/PLATELET
BASO%: 0.6 % (ref 0.0–2.0)
Basophils Absolute: 0 10*3/uL (ref 0.0–0.1)
EOS%: 0.3 % (ref 0.0–7.0)
Eosinophils Absolute: 0 10*3/uL (ref 0.0–0.5)
HCT: 28.4 % — ABNORMAL LOW (ref 34.8–46.6)
HGB: 9.9 g/dL — ABNORMAL LOW (ref 11.6–15.9)
LYMPH#: 0.8 10*3/uL — AB (ref 0.9–3.3)
LYMPH%: 10.5 % — ABNORMAL LOW (ref 14.0–49.7)
MCH: 30.4 pg (ref 25.1–34.0)
MCHC: 35 g/dL (ref 31.5–36.0)
MCV: 87 fL (ref 79.5–101.0)
MONO#: 0.9 10*3/uL (ref 0.1–0.9)
MONO%: 12.4 % (ref 0.0–14.0)
NEUT#: 5.7 10*3/uL (ref 1.5–6.5)
NEUT%: 76.2 % (ref 38.4–76.8)
Platelets: 238 10*3/uL (ref 145–400)
RBC: 3.26 10*6/uL — ABNORMAL LOW (ref 3.70–5.45)
RDW: 14.3 % (ref 11.2–14.5)
WBC: 7.4 10*3/uL (ref 3.9–10.3)

## 2014-06-19 MED ORDER — ONDANSETRON 16 MG/50ML IVPB (CHCC)
INTRAVENOUS | Status: AC
  Filled 2014-06-19: qty 16

## 2014-06-19 MED ORDER — SODIUM CHLORIDE 0.9 % IJ SOLN
10.0000 mL | INTRAMUSCULAR | Status: DC | PRN
  Administered 2014-06-19: 10 mL
  Filled 2014-06-19: qty 10

## 2014-06-19 MED ORDER — HEPARIN SOD (PORK) LOCK FLUSH 100 UNIT/ML IV SOLN
500.0000 [IU] | Freq: Once | INTRAVENOUS | Status: AC | PRN
  Administered 2014-06-19: 500 [IU]
  Filled 2014-06-19: qty 5

## 2014-06-19 MED ORDER — SODIUM CHLORIDE 0.9 % IV SOLN
359.0000 mg | Freq: Once | INTRAVENOUS | Status: AC
  Administered 2014-06-19: 360 mg via INTRAVENOUS
  Filled 2014-06-19: qty 36

## 2014-06-19 MED ORDER — SODIUM CHLORIDE 0.9 % IV SOLN
120.0000 mg/m2 | Freq: Once | INTRAVENOUS | Status: AC
  Administered 2014-06-19: 200 mg via INTRAVENOUS
  Filled 2014-06-19: qty 10

## 2014-06-19 MED ORDER — ONDANSETRON 16 MG/50ML IVPB (CHCC)
16.0000 mg | Freq: Once | INTRAVENOUS | Status: AC
  Administered 2014-06-19: 16 mg via INTRAVENOUS

## 2014-06-19 MED ORDER — SODIUM CHLORIDE 0.9 % IV SOLN
Freq: Once | INTRAVENOUS | Status: AC
  Administered 2014-06-19: 12:00:00 via INTRAVENOUS

## 2014-06-19 MED ORDER — DEXAMETHASONE SODIUM PHOSPHATE 20 MG/5ML IJ SOLN
20.0000 mg | Freq: Once | INTRAMUSCULAR | Status: AC
  Administered 2014-06-19: 20 mg via INTRAVENOUS

## 2014-06-19 MED ORDER — DEXAMETHASONE SODIUM PHOSPHATE 20 MG/5ML IJ SOLN
INTRAMUSCULAR | Status: AC
  Filled 2014-06-19: qty 5

## 2014-06-19 NOTE — Telephone Encounter (Signed)
Gave pt appt for lab,md chemo for June and July 2015

## 2014-06-19 NOTE — Patient Instructions (Signed)
[facility] Discharge Instructions for Patients Receiving Chemotherapy  Today you received the following chemotherapy agents: Carboplatin, Etoposide  To help prevent nausea and vomiting after your treatment, we encourage you to take your nausea medication as prescribed by your physician.    If you develop nausea and vomiting that is not controlled by your nausea medication, call the clinic.   BELOW ARE SYMPTOMS THAT SHOULD BE REPORTED IMMEDIATELY:  *FEVER GREATER THAN 100.5 F  *CHILLS WITH OR WITHOUT FEVER  NAUSEA AND VOMITING THAT IS NOT CONTROLLED WITH YOUR NAUSEA MEDICATION  *UNUSUAL SHORTNESS OF BREATH  *UNUSUAL BRUISING OR BLEEDING  TENDERNESS IN MOUTH AND THROAT WITH OR WITHOUT PRESENCE OF ULCERS  *URINARY PROBLEMS  *BOWEL PROBLEMS  UNUSUAL RASH Items with * indicate a potential emergency and should be followed up as soon as possible.  Feel free to call the clinic you have any questions or concerns. The clinic phone number is [phone].

## 2014-06-19 NOTE — Telephone Encounter (Signed)
Per staff phone call and POF I have schedueld appts. Scheduler advised of appts.  JMW  

## 2014-06-19 NOTE — Progress Notes (Signed)
[facility] [phone]   Fax:([phone]  OFFICE PROGRESS NOTE  Frances Man, MD [place] [address]  DIAGNOSIS: Extensive stage small cell lung cancer presenting with large mediastinal mass with bilateral mediastinal lymphadenopathy as well as bilateral pulmonary nodules and supraclavicular lymph nodes in addition to questionable malignant pericardial effusion diagnosed in April of 2015.  PRIOR THERAPY:   CURRENT THERAPY: Systemic chemotherapy with carboplatin for AUC of 5 on day 1 and etoposide 120 mg/M2 on days 1, 2 and 3 with Neulasta support on day 4. Status post 2 cycles.  INTERVAL HISTORY: Frances Gonzalez 79 y.o. female returns to the clinic today for followup visit accompanied her sister and brother. The patient is feeling fine today with no specific complaints. She tolerated the second cycle of her systemic chemotherapy with carboplatin and etoposide fairly well with no significant adverse effects except for increasing fatigue this time with mild shortness of breath. She denied having any significant fever or chills, no nausea or vomiting. She denied having any significant chest pain , no cough or hemoptysis. She had repeat CT scan of the chest, abdomen and pelvis performed recently and she is here for evaluation and discussion of her scan results.  MEDICAL HISTORY: Past Medical History  Diagnosis Date  . Hypertension   . Lung cancer     dx'd 2010/2015    ALLERGIES:  has No Known Allergies.  MEDICATIONS:  Current Outpatient Prescriptions  Medication Sig Dispense Refill  . dextromethorphan (DELSYM) 30 MG/5ML liquid Take 60 mg by mouth 2 (two) times daily.       . irbesartan (AVAPRO) 150 MG tablet Take 150 mg by mouth every morning.      . lidocaine-prilocaine (EMLA) cream Apply 1 application topically as needed.  30 g  0  . loratadine (CLARITIN) 10 MG tablet Take 10 mg by mouth daily as needed for allergies.       . Multiple Vitamins-Minerals (MULTIVITAMINS THER. W/MINERALS) TABS Take 1 tablet by mouth daily.       Marland Kitchen OVER THE COUNTER MEDICATION Target allergy brand      . pantoprazole (PROTONIX) 40 MG tablet Take 40 mg by mouth daily.      . prochlorperazine (COMPAZINE) 10 MG tablet Take 1 tablet (10 mg total) by mouth every 6 (six) hours as needed for nausea or vomiting.  60 tablet  0  . simvastatin (ZOCOR) 20 MG tablet Take 10 mg by mouth every evening.      Marland Kitchen tetrahydrozoline 0.05 % ophthalmic solution Place 1 drop into both eyes daily as needed (red eyes).       No current facility-administered medications for this visit.    SURGICAL HISTORY:  Past Surgical History  Procedure Laterality Date  . Lung cancer surgery  02/24/04    [facility] Dr Arlyce Dice    REVIEW OF SYSTEMS:  Constitutional: positive for fatigue Eyes: negative Ears, nose, mouth, throat, and face: negative Respiratory: positive for dyspnea on exertion Cardiovascular: negative Gastrointestinal: negative Genitourinary:negative Integument/breast: negative Hematologic/lymphatic: negative Musculoskeletal:negative Neurological: negative Behavioral/Psych: negative Endocrine: negative Allergic/Immunologic: negative   PHYSICAL EXAMINATION: General appearance: alert, cooperative and no distress Head: Normocephalic, without obvious abnormality, atraumatic Neck: no adenopathy, no JVD, supple, symmetrical, trachea midline and thyroid not enlarged, symmetric, no tenderness/mass/nodules Lymph nodes: Cervical, supraclavicular, and axillary nodes normal. Resp: clear to auscultation bilaterally Back: symmetric, no curvature. ROM normal. No CVA tenderness. Cardio: regular rate and rhythm, S1, S2 normal, no  murmur, click, rub or gallop GI: soft, non-tender; bowel sounds normal; no masses,  no organomegaly Extremities: extremities normal, atraumatic, no cyanosis or edema Neurologic: Alert and oriented X 3, normal strength and tone. Normal  symmetric reflexes. Normal coordination and gait  ECOG PERFORMANCE STATUS: 1 - Symptomatic but completely ambulatory  Blood pressure 115/77, pulse 115, temperature 97.5 F (36.4 C), temperature source Oral, resp. rate 18, height 5\' 1"  (1.549 m), weight 134 lb 4.8 oz (60.918 kg), SpO2 98.00%.  LABORATORY DATA: Lab Results  Component Value Date   WBC 7.4 06/19/2014   HGB 9.9* 06/19/2014   HCT 28.4* 06/19/2014   MCV 87.0 06/19/2014   PLT 238 06/19/2014      Chemistry      Component Value Date/Time   NA 136 06/19/2014 1009   NA 132* 04/05/2014 1705   K 4.0 06/19/2014 1009   K 4.4 04/05/2014 1705   CL 98 04/05/2014 1705   CO2 24 06/19/2014 1009   CO2 26 04/05/2014 1705   BUN 9.4 06/19/2014 1009   BUN 11 04/05/2014 1705   CREATININE 0.8 06/19/2014 1009   CREATININE 0.6 04/05/2014 1705      Component Value Date/Time   CALCIUM 8.6 06/19/2014 1009   CALCIUM 9.6 04/05/2014 1705   ALKPHOS 92 06/19/2014 1009   ALKPHOS 61 01/16/2013 1411   AST 16 06/19/2014 1009   AST 23 01/16/2013 1411   ALT 8 06/19/2014 1009   ALT 17 01/16/2013 1411   BILITOT 0.40 06/19/2014 1009   BILITOT 0.6 01/16/2013 1411       RADIOGRAPHIC STUDIES: Ct Chest W Contrast  06/12/2014   CLINICAL DATA:  Followup recurrent small cell lung carcinoma. Chemotherapy and progress. Restaging.  EXAM: CT CHEST, ABDOMEN, AND PELVIS WITH CONTRAST  TECHNIQUE: Multidetector CT imaging of the chest, abdomen and pelvis was performed following the standard protocol during bolus administration of intravenous contrast.  CONTRAST:  148mL OMNIPAQUE IOHEXOL 300 MG/ML  SOLN  COMPARISON:  PET-CT on 04/17/2014  FINDINGS: CT CHEST FINDINGS  There has been significant decrease in bulky mediastinal lymphadenopathy since prior exam. Index lymphadenopathy in the subcarinal region currently measures 3.2 x 3.9 cm on image 32 compared to 5.8 x 5.8 cm previously. A sub-cm right supraclavicular lymph node shows interval decrease in size. No new or increased areas of  lymphadenopathy are seen within the thorax. Small hiatal hernia and small pericardial effusion again noted.  No evidence pleural effusion. Pulmonary emphysema again demonstrated. Previously demonstrated masslike opacity in the suprahilar central left upper lobe has also decreased, measuring approximately 0.9 x 1.8 cm compared to 1.4 x 2.0 cm previously. Previously seen nodular density in the right upper lobe is also decreased. 6 mm nodule in the left lung base on image 50 remains unchanged. No new or enlarging pulmonary nodules or masses are identified. No suspicious bone lesions identified.  CT ABDOMEN AND PELVIS FINDINGS  The liver, pancreas, spleen, and kidneys remain normal in appearance. No evidence hydronephrosis.  Left adrenal mass remains stable and consistent with benign adrenal adenoma. No other soft tissue masses or lymphadenopathy identified within the abdomen or pelvis. Uterus and adnexal regions are unremarkable.  No evidence of inflammatory process or abnormal fluid collections. No evidence of bowel wall thickening or dilatation. No suspicious bone lesions identified. Old lower thoracic and lumbar vertebral body compression fracture deformities again noted.  IMPRESSION: Significant decrease in bulky mediastinal and mild right supraclavicular lymphadenopathy, and bilateral pulmonary nodules.  No new or progressive disease identified.  No evidence of abdominal or pelvic metastatic disease.   Electronically Signed   By: Earle Gell M.D.   On: 06/12/2014 14:25   ASSESSMENT AND PLAN: This is a very pleasant 79 years old white female recently diagnosed with extensive stage small cell lung cancer currently undergoing systemic chemotherapy with carboplatin and etoposide status post 2 cycles. The patient tolerated the second cycle of her treatment well except for increasing fatigue most likely secondary to chemotherapy-induced anemia. Her recent CT scan of the chest, abdomen and pelvis showed significant  improvement in her disease in the chest with no evidence for metastatic disease in the abdomen or pelvis. I discussed the scan results and showed the images to the patient and her family. I recommended for her to proceed with the third cycle today as scheduled. She would come back for followup visit in 3 weeks for reevaluation before starting cycle #4. She was advised to call immediately if she has any concerning symptoms in the interval.  The patient voices understanding of current disease status and treatment options and is in agreement with the current care plan.  All questions were answered. The patient knows to call the clinic with any problems, questions or concerns. We can certainly see the patient much sooner if necessary.  Disclaimer: This note was dictated with voice recognition software. Similar sounding words can inadvertently be transcribed and may not be corrected upon review.

## 2014-06-20 ENCOUNTER — Ambulatory Visit (HOSPITAL_BASED_OUTPATIENT_CLINIC_OR_DEPARTMENT_OTHER): Payer: Medicare Other

## 2014-06-20 VITALS — BP 136/78 | HR 115 | Temp 97.0°F | Resp 18

## 2014-06-20 DIAGNOSIS — C7A1 Malignant poorly differentiated neuroendocrine tumors: Secondary | ICD-10-CM

## 2014-06-20 DIAGNOSIS — C3490 Malignant neoplasm of unspecified part of unspecified bronchus or lung: Secondary | ICD-10-CM

## 2014-06-20 DIAGNOSIS — Z5111 Encounter for antineoplastic chemotherapy: Secondary | ICD-10-CM

## 2014-06-20 MED ORDER — SODIUM CHLORIDE 0.9 % IJ SOLN
10.0000 mL | INTRAMUSCULAR | Status: DC | PRN
  Administered 2014-06-20: 10 mL
  Filled 2014-06-20: qty 10

## 2014-06-20 MED ORDER — ONDANSETRON 8 MG/NS 50 ML IVPB
INTRAVENOUS | Status: AC
  Filled 2014-06-20: qty 8

## 2014-06-20 MED ORDER — SODIUM CHLORIDE 0.9 % IV SOLN
Freq: Once | INTRAVENOUS | Status: AC
  Administered 2014-06-20: 11:00:00 via INTRAVENOUS

## 2014-06-20 MED ORDER — HEPARIN SOD (PORK) LOCK FLUSH 100 UNIT/ML IV SOLN
500.0000 [IU] | Freq: Once | INTRAVENOUS | Status: AC | PRN
  Administered 2014-06-20: 500 [IU]
  Filled 2014-06-20: qty 5

## 2014-06-20 MED ORDER — ETOPOSIDE CHEMO INJECTION 1 GM/50ML
120.0000 mg/m2 | Freq: Once | INTRAVENOUS | Status: AC
  Administered 2014-06-20: 200 mg via INTRAVENOUS
  Filled 2014-06-20: qty 10

## 2014-06-20 MED ORDER — ONDANSETRON 8 MG/50ML IVPB (CHCC)
8.0000 mg | Freq: Once | INTRAVENOUS | Status: AC
  Administered 2014-06-20: 8 mg via INTRAVENOUS

## 2014-06-20 MED ORDER — DEXAMETHASONE SODIUM PHOSPHATE 10 MG/ML IJ SOLN
10.0000 mg | Freq: Once | INTRAMUSCULAR | Status: AC
  Administered 2014-06-20: 10 mg via INTRAVENOUS

## 2014-06-20 MED ORDER — DEXAMETHASONE SODIUM PHOSPHATE 10 MG/ML IJ SOLN
INTRAMUSCULAR | Status: AC
  Filled 2014-06-20: qty 1

## 2014-06-20 NOTE — Patient Instructions (Signed)
[facility] Discharge Instructions for Patients Receiving Chemotherapy  Today you received the following chemotherapy agents etoposide.   To help prevent nausea and vomiting after your treatment, we encourage you to take your nausea medication as directed.     If you develop nausea and vomiting that is not controlled by your nausea medication, call the clinic.   BELOW ARE SYMPTOMS THAT SHOULD BE REPORTED IMMEDIATELY:  *FEVER GREATER THAN 100.5 F  *CHILLS WITH OR WITHOUT FEVER  NAUSEA AND VOMITING THAT IS NOT CONTROLLED WITH YOUR NAUSEA MEDICATION  *UNUSUAL SHORTNESS OF BREATH  *UNUSUAL BRUISING OR BLEEDING  TENDERNESS IN MOUTH AND THROAT WITH OR WITHOUT PRESENCE OF ULCERS  *URINARY PROBLEMS  *BOWEL PROBLEMS  UNUSUAL RASH Items with * indicate a potential emergency and should be followed up as soon as possible.  Feel free to call the clinic you have any questions or concerns. The clinic phone number is [phone].

## 2014-06-21 ENCOUNTER — Ambulatory Visit (HOSPITAL_BASED_OUTPATIENT_CLINIC_OR_DEPARTMENT_OTHER): Payer: 59

## 2014-06-21 VITALS — BP 130/71 | HR 118 | Temp 98.3°F | Resp 18

## 2014-06-21 DIAGNOSIS — C7A1 Malignant poorly differentiated neuroendocrine tumors: Secondary | ICD-10-CM

## 2014-06-21 DIAGNOSIS — Z5111 Encounter for antineoplastic chemotherapy: Secondary | ICD-10-CM

## 2014-06-21 DIAGNOSIS — C3490 Malignant neoplasm of unspecified part of unspecified bronchus or lung: Secondary | ICD-10-CM

## 2014-06-21 MED ORDER — SODIUM CHLORIDE 0.9 % IV SOLN
Freq: Once | INTRAVENOUS | Status: AC
  Administered 2014-06-21: 11:00:00 via INTRAVENOUS

## 2014-06-21 MED ORDER — SODIUM CHLORIDE 0.9 % IJ SOLN
10.0000 mL | INTRAMUSCULAR | Status: DC | PRN
  Administered 2014-06-21: 10 mL
  Filled 2014-06-21: qty 10

## 2014-06-21 MED ORDER — ONDANSETRON 8 MG/NS 50 ML IVPB
INTRAVENOUS | Status: AC
  Filled 2014-06-21: qty 8

## 2014-06-21 MED ORDER — HEPARIN SOD (PORK) LOCK FLUSH 100 UNIT/ML IV SOLN
500.0000 [IU] | Freq: Once | INTRAVENOUS | Status: AC | PRN
  Administered 2014-06-21: 500 [IU]
  Filled 2014-06-21: qty 5

## 2014-06-21 MED ORDER — ONDANSETRON 8 MG/50ML IVPB (CHCC)
8.0000 mg | Freq: Once | INTRAVENOUS | Status: AC
  Administered 2014-06-21: 8 mg via INTRAVENOUS

## 2014-06-21 MED ORDER — DEXAMETHASONE SODIUM PHOSPHATE 10 MG/ML IJ SOLN
10.0000 mg | Freq: Once | INTRAMUSCULAR | Status: AC
  Administered 2014-06-21: 10 mg via INTRAVENOUS

## 2014-06-21 MED ORDER — DEXAMETHASONE SODIUM PHOSPHATE 10 MG/ML IJ SOLN
INTRAMUSCULAR | Status: AC
  Filled 2014-06-21: qty 1

## 2014-06-21 MED ORDER — SODIUM CHLORIDE 0.9 % IV SOLN
120.0000 mg/m2 | Freq: Once | INTRAVENOUS | Status: AC
  Administered 2014-06-21: 200 mg via INTRAVENOUS
  Filled 2014-06-21: qty 10

## 2014-06-21 NOTE — Patient Instructions (Signed)
[facility] Discharge Instructions for Patients Receiving Chemotherapy  Today you received the following chemotherapy agents Etoposide.  To help prevent nausea and vomiting after your treatment, we encourage you to take your nausea medication.   If you develop nausea and vomiting that is not controlled by your nausea medication, call the clinic.   BELOW ARE SYMPTOMS THAT SHOULD BE REPORTED IMMEDIATELY:  *FEVER GREATER THAN 100.5 F  *CHILLS WITH OR WITHOUT FEVER  NAUSEA AND VOMITING THAT IS NOT CONTROLLED WITH YOUR NAUSEA MEDICATION  *UNUSUAL SHORTNESS OF BREATH  *UNUSUAL BRUISING OR BLEEDING  TENDERNESS IN MOUTH AND THROAT WITH OR WITHOUT PRESENCE OF ULCERS  *URINARY PROBLEMS  *BOWEL PROBLEMS  UNUSUAL RASH Items with * indicate a potential emergency and should be followed up as soon as possible.  Feel free to call the clinic you have any questions or concerns. The clinic phone number is [phone].

## 2014-06-22 ENCOUNTER — Ambulatory Visit (HOSPITAL_BASED_OUTPATIENT_CLINIC_OR_DEPARTMENT_OTHER): Payer: 59

## 2014-06-22 VITALS — BP 111/66 | HR 125 | Temp 97.1°F | Resp 18

## 2014-06-22 DIAGNOSIS — Z5189 Encounter for other specified aftercare: Secondary | ICD-10-CM

## 2014-06-22 DIAGNOSIS — C7A1 Malignant poorly differentiated neuroendocrine tumors: Secondary | ICD-10-CM

## 2014-06-22 MED ORDER — PEGFILGRASTIM INJECTION 6 MG/0.6ML
6.0000 mg | Freq: Once | SUBCUTANEOUS | Status: AC
  Administered 2014-06-22: 6 mg via SUBCUTANEOUS

## 2014-06-26 ENCOUNTER — Telehealth: Payer: Self-pay | Admitting: *Deleted

## 2014-06-26 ENCOUNTER — Telehealth: Payer: Self-pay | Admitting: Internal Medicine

## 2014-06-26 ENCOUNTER — Other Ambulatory Visit (HOSPITAL_BASED_OUTPATIENT_CLINIC_OR_DEPARTMENT_OTHER): Payer: Medicare Other

## 2014-06-26 DIAGNOSIS — J9859 Other diseases of mediastinum, not elsewhere classified: Secondary | ICD-10-CM

## 2014-06-26 DIAGNOSIS — C7A1 Malignant poorly differentiated neuroendocrine tumors: Secondary | ICD-10-CM

## 2014-06-26 LAB — COMPREHENSIVE METABOLIC PANEL (CC13)
ALT: 10 U/L (ref 0–55)
AST: 14 U/L (ref 5–34)
Albumin: 3.8 g/dL (ref 3.5–5.0)
Alkaline Phosphatase: 109 U/L (ref 40–150)
Anion Gap: 9 mEq/L (ref 3–11)
BUN: 17.8 mg/dL (ref 7.0–26.0)
CALCIUM: 9.4 mg/dL (ref 8.4–10.4)
CHLORIDE: 99 meq/L (ref 98–109)
CO2: 24 mEq/L (ref 22–29)
CREATININE: 0.7 mg/dL (ref 0.6–1.1)
Glucose: 102 mg/dl (ref 70–140)
Potassium: 4.5 mEq/L (ref 3.5–5.1)
Sodium: 132 mEq/L — ABNORMAL LOW (ref 136–145)
Total Bilirubin: 0.81 mg/dL (ref 0.20–1.20)
Total Protein: 6.7 g/dL (ref 6.4–8.3)

## 2014-06-26 LAB — CBC WITH DIFFERENTIAL/PLATELET
BASO%: 0.2 % (ref 0.0–2.0)
Basophils Absolute: 0 10*3/uL (ref 0.0–0.1)
EOS%: 0.1 % (ref 0.0–7.0)
Eosinophils Absolute: 0 10*3/uL (ref 0.0–0.5)
HCT: 24.9 % — ABNORMAL LOW (ref 34.8–46.6)
HEMOGLOBIN: 8.7 g/dL — AB (ref 11.6–15.9)
LYMPH%: 5 % — ABNORMAL LOW (ref 14.0–49.7)
MCH: 30.8 pg (ref 25.1–34.0)
MCHC: 34.8 g/dL (ref 31.5–36.0)
MCV: 88.6 fL (ref 79.5–101.0)
MONO#: 0.1 10*3/uL (ref 0.1–0.9)
MONO%: 0.8 % (ref 0.0–14.0)
NEUT#: 9.4 10*3/uL — ABNORMAL HIGH (ref 1.5–6.5)
NEUT%: 93.9 % — ABNORMAL HIGH (ref 38.4–76.8)
PLATELETS: 185 10*3/uL (ref 145–400)
RBC: 2.81 10*6/uL — ABNORMAL LOW (ref 3.70–5.45)
RDW: 15.8 % — ABNORMAL HIGH (ref 11.2–14.5)
WBC: 10 10*3/uL (ref 3.9–10.3)
lymph#: 0.5 10*3/uL — ABNORMAL LOW (ref 0.9–3.3)

## 2014-06-26 NOTE — Telephone Encounter (Signed)
Pt called wanting to see if she can have a tooth filled where a filling has fallen out.  Per Dr Vista Mink, as long as her labs look okay, it is okay for her to have filling replaced.  Informed patient.  Lab work from 06/26/14 WNL.  Advised she schedule it the week before her next chemo appt.  She verbalized understanding.  SLJ

## 2014-06-26 NOTE — Telephone Encounter (Signed)
Talked to pt and pt aware of labs ,md chemo and left VM for  [facility] regarding chemo adjustments

## 2014-06-27 ENCOUNTER — Telehealth: Payer: Self-pay | Admitting: *Deleted

## 2014-06-27 NOTE — Telephone Encounter (Signed)
Per staff phone call I have adjusted appt

## 2014-07-02 ENCOUNTER — Telehealth: Payer: Self-pay | Admitting: Internal Medicine

## 2014-07-02 NOTE — Telephone Encounter (Signed)
Talked to gave her appt for lab,md chemo and injections for July and August

## 2014-07-03 ENCOUNTER — Other Ambulatory Visit (HOSPITAL_BASED_OUTPATIENT_CLINIC_OR_DEPARTMENT_OTHER): Payer: Medicare Other

## 2014-07-03 DIAGNOSIS — J9859 Other diseases of mediastinum, not elsewhere classified: Secondary | ICD-10-CM

## 2014-07-03 DIAGNOSIS — C7A1 Malignant poorly differentiated neuroendocrine tumors: Secondary | ICD-10-CM

## 2014-07-03 LAB — COMPREHENSIVE METABOLIC PANEL (CC13)
ALK PHOS: 100 U/L (ref 40–150)
ALT: 12 U/L (ref 0–55)
ANION GAP: 8 meq/L (ref 3–11)
AST: 17 U/L (ref 5–34)
Albumin: 3.8 g/dL (ref 3.5–5.0)
BUN: 4.9 mg/dL — AB (ref 7.0–26.0)
CALCIUM: 8.3 mg/dL — AB (ref 8.4–10.4)
CHLORIDE: 101 meq/L (ref 98–109)
CO2: 25 meq/L (ref 22–29)
Creatinine: 0.8 mg/dL (ref 0.6–1.1)
GLUCOSE: 96 mg/dL (ref 70–140)
POTASSIUM: 4.1 meq/L (ref 3.5–5.1)
Sodium: 134 mEq/L — ABNORMAL LOW (ref 136–145)
TOTAL PROTEIN: 6.7 g/dL (ref 6.4–8.3)
Total Bilirubin: 0.37 mg/dL (ref 0.20–1.20)

## 2014-07-03 LAB — CBC WITH DIFFERENTIAL/PLATELET
BASO%: 0.4 % (ref 0.0–2.0)
BASOS ABS: 0 10*3/uL (ref 0.0–0.1)
EOS ABS: 0 10*3/uL (ref 0.0–0.5)
EOS%: 0.2 % (ref 0.0–7.0)
HCT: 23.2 % — ABNORMAL LOW (ref 34.8–46.6)
HEMOGLOBIN: 7.9 g/dL — AB (ref 11.6–15.9)
LYMPH%: 8.7 % — ABNORMAL LOW (ref 14.0–49.7)
MCH: 30.1 pg (ref 25.1–34.0)
MCHC: 34 g/dL (ref 31.5–36.0)
MCV: 88.4 fL (ref 79.5–101.0)
MONO#: 1 10*3/uL — AB (ref 0.1–0.9)
MONO%: 8.7 % (ref 0.0–14.0)
NEUT%: 82 % — ABNORMAL HIGH (ref 38.4–76.8)
NEUTROS ABS: 9.4 10*3/uL — AB (ref 1.5–6.5)
Platelets: 66 10*3/uL — ABNORMAL LOW (ref 145–400)
RBC: 2.62 10*6/uL — ABNORMAL LOW (ref 3.70–5.45)
RDW: 16.1 % — AB (ref 11.2–14.5)
WBC: 11.5 10*3/uL — AB (ref 3.9–10.3)
lymph#: 1 10*3/uL (ref 0.9–3.3)

## 2014-07-09 ENCOUNTER — Other Ambulatory Visit: Payer: Self-pay | Admitting: Internal Medicine

## 2014-07-10 ENCOUNTER — Ambulatory Visit (HOSPITAL_COMMUNITY)
Admission: RE | Admit: 2014-07-10 | Discharge: 2014-07-10 | Disposition: A | Discharge status: Home / Self Care | Payer: Medicare Other | Source: Ambulatory Visit | Attending: Internal Medicine | Admitting: Internal Medicine

## 2014-07-10 ENCOUNTER — Telehealth: Payer: Self-pay | Admitting: Internal Medicine

## 2014-07-10 ENCOUNTER — Ambulatory Visit (HOSPITAL_BASED_OUTPATIENT_CLINIC_OR_DEPARTMENT_OTHER): Payer: Medicare Other

## 2014-07-10 ENCOUNTER — Ambulatory Visit (HOSPITAL_BASED_OUTPATIENT_CLINIC_OR_DEPARTMENT_OTHER): Payer: Medicare Other | Admitting: Physician Assistant

## 2014-07-10 ENCOUNTER — Other Ambulatory Visit (HOSPITAL_BASED_OUTPATIENT_CLINIC_OR_DEPARTMENT_OTHER): Payer: Medicare Other

## 2014-07-10 VITALS — BP 105/60 | HR 105 | Temp 97.6°F | Resp 15 | Ht 61.0 in | Wt 136.5 lb

## 2014-07-10 DIAGNOSIS — D6481 Anemia due to antineoplastic chemotherapy: Secondary | ICD-10-CM

## 2014-07-10 DIAGNOSIS — I959 Hypotension, unspecified: Secondary | ICD-10-CM

## 2014-07-10 DIAGNOSIS — R5381 Other malaise: Secondary | ICD-10-CM

## 2014-07-10 DIAGNOSIS — C7A1 Malignant poorly differentiated neuroendocrine tumors: Secondary | ICD-10-CM

## 2014-07-10 DIAGNOSIS — D649 Anemia, unspecified: Secondary | ICD-10-CM | POA: Insufficient documentation

## 2014-07-10 DIAGNOSIS — J9859 Other diseases of mediastinum, not elsewhere classified: Secondary | ICD-10-CM

## 2014-07-10 DIAGNOSIS — C3490 Malignant neoplasm of unspecified part of unspecified bronchus or lung: Secondary | ICD-10-CM

## 2014-07-10 DIAGNOSIS — T451X5A Adverse effect of antineoplastic and immunosuppressive drugs, initial encounter: Secondary | ICD-10-CM

## 2014-07-10 DIAGNOSIS — R5383 Other fatigue: Secondary | ICD-10-CM

## 2014-07-10 DIAGNOSIS — Z5111 Encounter for antineoplastic chemotherapy: Secondary | ICD-10-CM

## 2014-07-10 LAB — COMPREHENSIVE METABOLIC PANEL (CC13)
ALK PHOS: 94 U/L (ref 40–150)
ALT: 8 U/L (ref 0–55)
AST: 15 U/L (ref 5–34)
Albumin: 3.8 g/dL (ref 3.5–5.0)
Anion Gap: 8 mEq/L (ref 3–11)
BUN: 11.2 mg/dL (ref 7.0–26.0)
CALCIUM: 8.4 mg/dL (ref 8.4–10.4)
CHLORIDE: 105 meq/L (ref 98–109)
CO2: 23 mEq/L (ref 22–29)
CREATININE: 0.8 mg/dL (ref 0.6–1.1)
Glucose: 104 mg/dl (ref 70–140)
Potassium: 4.1 mEq/L (ref 3.5–5.1)
Sodium: 136 mEq/L (ref 136–145)
Total Bilirubin: 0.42 mg/dL (ref 0.20–1.20)
Total Protein: 7 g/dL (ref 6.4–8.3)

## 2014-07-10 LAB — CBC WITH DIFFERENTIAL/PLATELET
BASO%: 0.4 % (ref 0.0–2.0)
Basophils Absolute: 0 10*3/uL (ref 0.0–0.1)
EOS ABS: 0 10*3/uL (ref 0.0–0.5)
EOS%: 0.4 % (ref 0.0–7.0)
HCT: 24.3 % — ABNORMAL LOW (ref 34.8–46.6)
HGB: 8.4 g/dL — ABNORMAL LOW (ref 11.6–15.9)
LYMPH#: 0.8 10*3/uL — AB (ref 0.9–3.3)
LYMPH%: 9.5 % — ABNORMAL LOW (ref 14.0–49.7)
MCH: 31.7 pg (ref 25.1–34.0)
MCHC: 34.6 g/dL (ref 31.5–36.0)
MCV: 91.6 fL (ref 79.5–101.0)
MONO#: 0.9 10*3/uL (ref 0.1–0.9)
MONO%: 10.6 % (ref 0.0–14.0)
NEUT%: 79.1 % — ABNORMAL HIGH (ref 38.4–76.8)
NEUTROS ABS: 7 10*3/uL — AB (ref 1.5–6.5)
Platelets: 233 10*3/uL (ref 145–400)
RBC: 2.65 10*6/uL — ABNORMAL LOW (ref 3.70–5.45)
RDW: 20.6 % — AB (ref 11.2–14.5)
WBC: 8.9 10*3/uL (ref 3.9–10.3)

## 2014-07-10 LAB — HOLD TUBE, BLOOD BANK

## 2014-07-10 MED ORDER — SODIUM CHLORIDE 0.9 % IV SOLN
Freq: Once | INTRAVENOUS | Status: AC
  Administered 2014-07-10: 15:00:00 via INTRAVENOUS

## 2014-07-10 MED ORDER — SODIUM CHLORIDE 0.9 % IV SOLN
120.0000 mg/m2 | Freq: Once | INTRAVENOUS | Status: AC
  Administered 2014-07-10: 200 mg via INTRAVENOUS
  Filled 2014-07-10: qty 10

## 2014-07-10 MED ORDER — HEPARIN SOD (PORK) LOCK FLUSH 100 UNIT/ML IV SOLN
500.0000 [IU] | Freq: Once | INTRAVENOUS | Status: AC | PRN
  Administered 2014-07-10: 500 [IU]
  Filled 2014-07-10: qty 5

## 2014-07-10 MED ORDER — SODIUM CHLORIDE 0.9 % IJ SOLN
10.0000 mL | INTRAMUSCULAR | Status: DC | PRN
Start: 2014-07-10 — End: 2014-07-10
  Administered 2014-07-10: 10 mL
  Filled 2014-07-10: qty 10

## 2014-07-10 MED ORDER — ONDANSETRON 16 MG/50ML IVPB (CHCC)
16.0000 mg | Freq: Once | INTRAVENOUS | Status: AC
  Administered 2014-07-10: 16 mg via INTRAVENOUS

## 2014-07-10 MED ORDER — DEXAMETHASONE SODIUM PHOSPHATE 20 MG/5ML IJ SOLN
INTRAMUSCULAR | Status: AC
  Filled 2014-07-10: qty 5

## 2014-07-10 MED ORDER — DEXAMETHASONE SODIUM PHOSPHATE 20 MG/5ML IJ SOLN
20.0000 mg | Freq: Once | INTRAMUSCULAR | Status: AC
  Administered 2014-07-10: 20 mg via INTRAVENOUS

## 2014-07-10 MED ORDER — CARBOPLATIN CHEMO INJECTION 450 MG/45ML
359.0000 mg | Freq: Once | INTRAVENOUS | Status: AC
  Administered 2014-07-10: 360 mg via INTRAVENOUS
  Filled 2014-07-10: qty 36

## 2014-07-10 MED ORDER — ONDANSETRON 16 MG/50ML IVPB (CHCC)
INTRAVENOUS | Status: AC
  Filled 2014-07-10: qty 16

## 2014-07-10 NOTE — Progress Notes (Signed)
Per Adrena PA, okay to tx with hgb-8.4. Type/Cross obtained in infusion rm for possible blood transfusion tomorrow per MD, if needed.

## 2014-07-10 NOTE — Telephone Encounter (Signed)
gv adnrpinted appt sched and avs for pt for July and Aug....sed added tx.

## 2014-07-10 NOTE — Patient Instructions (Signed)
[facility] Discharge Instructions for Patients Receiving Chemotherapy  Today you received the following chemotherapy agents: Carboplatin, Etoposide  To help prevent nausea and vomiting after your treatment, we encourage you to take your nausea medication as prescribed by your physician.    If you develop nausea and vomiting that is not controlled by your nausea medication, call the clinic.   BELOW ARE SYMPTOMS THAT SHOULD BE REPORTED IMMEDIATELY:  *FEVER GREATER THAN 100.5 F  *CHILLS WITH OR WITHOUT FEVER  NAUSEA AND VOMITING THAT IS NOT CONTROLLED WITH YOUR NAUSEA MEDICATION  *UNUSUAL SHORTNESS OF BREATH  *UNUSUAL BRUISING OR BLEEDING  TENDERNESS IN MOUTH AND THROAT WITH OR WITHOUT PRESENCE OF ULCERS  *URINARY PROBLEMS  *BOWEL PROBLEMS  UNUSUAL RASH Items with * indicate a potential emergency and should be followed up as soon as possible.  Feel free to call the clinic you have any questions or concerns. The clinic phone number is [phone].

## 2014-07-11 ENCOUNTER — Other Ambulatory Visit: Payer: Self-pay | Admitting: Medical Oncology

## 2014-07-11 ENCOUNTER — Ambulatory Visit (HOSPITAL_BASED_OUTPATIENT_CLINIC_OR_DEPARTMENT_OTHER): Payer: Medicare Other

## 2014-07-11 ENCOUNTER — Telehealth: Payer: Self-pay | Admitting: Family Medicine

## 2014-07-11 ENCOUNTER — Ambulatory Visit: Payer: Medicare Other | Admitting: Internal Medicine

## 2014-07-11 VITALS — BP 153/79 | HR 97 | Temp 97.7°F | Resp 18

## 2014-07-11 DIAGNOSIS — D649 Anemia, unspecified: Secondary | ICD-10-CM

## 2014-07-11 DIAGNOSIS — C7A1 Malignant poorly differentiated neuroendocrine tumors: Secondary | ICD-10-CM

## 2014-07-11 DIAGNOSIS — Z5111 Encounter for antineoplastic chemotherapy: Secondary | ICD-10-CM

## 2014-07-11 DIAGNOSIS — C3490 Malignant neoplasm of unspecified part of unspecified bronchus or lung: Secondary | ICD-10-CM

## 2014-07-11 LAB — PREPARE RBC (CROSSMATCH)

## 2014-07-11 LAB — ABO/RH: ABO/RH(D): O POS

## 2014-07-11 MED ORDER — DEXAMETHASONE SODIUM PHOSPHATE 10 MG/ML IJ SOLN
INTRAMUSCULAR | Status: AC
  Filled 2014-07-11: qty 1

## 2014-07-11 MED ORDER — ONDANSETRON 8 MG/50ML IVPB (CHCC)
8.0000 mg | Freq: Once | INTRAVENOUS | Status: AC
  Administered 2014-07-11: 8 mg via INTRAVENOUS

## 2014-07-11 MED ORDER — DIPHENHYDRAMINE HCL 25 MG PO CAPS
25.0000 mg | ORAL_CAPSULE | Freq: Once | ORAL | Status: AC
  Administered 2014-07-11: 25 mg via ORAL

## 2014-07-11 MED ORDER — SODIUM CHLORIDE 0.9 % IV SOLN
Freq: Once | INTRAVENOUS | Status: AC
  Administered 2014-07-11: 13:00:00 via INTRAVENOUS

## 2014-07-11 MED ORDER — DIPHENHYDRAMINE HCL 25 MG PO CAPS
ORAL_CAPSULE | ORAL | Status: AC
  Filled 2014-07-11: qty 1

## 2014-07-11 MED ORDER — SODIUM CHLORIDE 0.9 % IJ SOLN
10.0000 mL | INTRAMUSCULAR | Status: DC | PRN
  Administered 2014-07-11: 10 mL
  Filled 2014-07-11: qty 10

## 2014-07-11 MED ORDER — ACETAMINOPHEN 325 MG PO TABS
650.0000 mg | ORAL_TABLET | Freq: Once | ORAL | Status: AC
  Administered 2014-07-11: 650 mg via ORAL

## 2014-07-11 MED ORDER — SODIUM CHLORIDE 0.9 % IV SOLN
120.0000 mg/m2 | Freq: Once | INTRAVENOUS | Status: AC
  Administered 2014-07-11: 200 mg via INTRAVENOUS
  Filled 2014-07-11: qty 10

## 2014-07-11 MED ORDER — ACETAMINOPHEN 325 MG PO TABS
ORAL_TABLET | ORAL | Status: AC
  Filled 2014-07-11: qty 2

## 2014-07-11 MED ORDER — ONDANSETRON 8 MG/NS 50 ML IVPB
INTRAVENOUS | Status: AC
  Filled 2014-07-11: qty 8

## 2014-07-11 MED ORDER — DEXAMETHASONE SODIUM PHOSPHATE 10 MG/ML IJ SOLN
10.0000 mg | Freq: Once | INTRAMUSCULAR | Status: AC
  Administered 2014-07-11: 10 mg via INTRAVENOUS

## 2014-07-11 MED ORDER — HEPARIN SOD (PORK) LOCK FLUSH 100 UNIT/ML IV SOLN
500.0000 [IU] | Freq: Once | INTRAVENOUS | Status: AC | PRN
  Administered 2014-07-11: 500 [IU]
  Filled 2014-07-11: qty 5

## 2014-07-11 NOTE — Telephone Encounter (Signed)
Spoke to patient  - per Dr Sherren Mocha patient should stop Avapro for 2 days. Then start taking half tablet a day.  Record blood pressure readings for 3 weeks and send [place] the results.  Blood pressure should stay under 140/90.

## 2014-07-11 NOTE — Progress Notes (Signed)
har done July 15

## 2014-07-11 NOTE — Patient Instructions (Signed)
[facility] Discharge Instructions for Patients Receiving Chemotherapy  Today you received the following chemotherapy agents:  Etoposide  To help prevent nausea and vomiting after your treatment, we encourage you to take your nausea medication as ordered per MD.   If you develop nausea and vomiting that is not controlled by your nausea medication, call the clinic.   BELOW ARE SYMPTOMS THAT SHOULD BE REPORTED IMMEDIATELY:  *FEVER GREATER THAN 100.5 F  *CHILLS WITH OR WITHOUT FEVER  NAUSEA AND VOMITING THAT IS NOT CONTROLLED WITH YOUR NAUSEA MEDICATION  *UNUSUAL SHORTNESS OF BREATH  *UNUSUAL BRUISING OR BLEEDING  TENDERNESS IN MOUTH AND THROAT WITH OR WITHOUT PRESENCE OF ULCERS  *URINARY PROBLEMS  *BOWEL PROBLEMS  UNUSUAL RASH Items with * indicate a potential emergency and should be followed up as soon as possible.  Feel free to call the clinic you have any questions or concerns. The clinic phone number is [phone].

## 2014-07-11 NOTE — Telephone Encounter (Signed)
Pt states dr wert changed her losartan-hydrochlorothiazide (HYZAAR) 100-25 MG per tablet To irbesartan (AVAPRO) 150 MG tablet Now her bp has gotten really low.  (98/45) at dr Earlie Server mohammed's office.  It has been low for some time. They have suggest pt call dr todd and figure out what she needs to do.  Pt will be at home until 11:30 or pls call pt on cell phone [phone] Pt has had to start chemo again for her 4th time. pls call [facility]/ [address]

## 2014-07-12 ENCOUNTER — Ambulatory Visit (HOSPITAL_BASED_OUTPATIENT_CLINIC_OR_DEPARTMENT_OTHER): Payer: Medicare Other

## 2014-07-12 ENCOUNTER — Other Ambulatory Visit: Payer: Self-pay | Admitting: Medical Oncology

## 2014-07-12 VITALS — BP 119/72 | HR 105 | Temp 98.3°F | Resp 18

## 2014-07-12 DIAGNOSIS — C7A1 Malignant poorly differentiated neuroendocrine tumors: Secondary | ICD-10-CM

## 2014-07-12 DIAGNOSIS — Z5111 Encounter for antineoplastic chemotherapy: Secondary | ICD-10-CM

## 2014-07-12 DIAGNOSIS — C3490 Malignant neoplasm of unspecified part of unspecified bronchus or lung: Secondary | ICD-10-CM

## 2014-07-12 MED ORDER — DEXAMETHASONE SODIUM PHOSPHATE 10 MG/ML IJ SOLN
INTRAMUSCULAR | Status: AC
  Filled 2014-07-12: qty 1

## 2014-07-12 MED ORDER — SODIUM CHLORIDE 0.9 % IJ SOLN
10.0000 mL | INTRAMUSCULAR | Status: DC | PRN
Start: 2014-07-12 — End: 2014-07-12
  Administered 2014-07-12: 10 mL
  Filled 2014-07-12: qty 10

## 2014-07-12 MED ORDER — DEXAMETHASONE SODIUM PHOSPHATE 10 MG/ML IJ SOLN
10.0000 mg | Freq: Once | INTRAMUSCULAR | Status: AC
  Administered 2014-07-12: 10 mg via INTRAVENOUS

## 2014-07-12 MED ORDER — HEPARIN SOD (PORK) LOCK FLUSH 100 UNIT/ML IV SOLN
500.0000 [IU] | Freq: Once | INTRAVENOUS | Status: AC | PRN
  Administered 2014-07-12: 500 [IU]
  Filled 2014-07-12: qty 5

## 2014-07-12 MED ORDER — SODIUM CHLORIDE 0.9 % IV SOLN
Freq: Once | INTRAVENOUS | Status: AC
  Administered 2014-07-12: 11:00:00 via INTRAVENOUS

## 2014-07-12 MED ORDER — ONDANSETRON 8 MG/50ML IVPB (CHCC)
8.0000 mg | Freq: Once | INTRAVENOUS | Status: AC
  Administered 2014-07-12: 8 mg via INTRAVENOUS

## 2014-07-12 MED ORDER — ONDANSETRON 8 MG/NS 50 ML IVPB
INTRAVENOUS | Status: AC
  Filled 2014-07-12: qty 8

## 2014-07-12 MED ORDER — SODIUM CHLORIDE 0.9 % IV SOLN
120.0000 mg/m2 | Freq: Once | INTRAVENOUS | Status: AC
  Administered 2014-07-12: 200 mg via INTRAVENOUS
  Filled 2014-07-12: qty 10

## 2014-07-12 NOTE — Patient Instructions (Signed)
[facility] Discharge Instructions for Patients Receiving Chemotherapy  Today you received the following chemotherapy agents VP-16 To help prevent nausea and vomiting after your treatment, we encourage you to take your nausea medication as prescibed.   If you develop nausea and vomiting that is not controlled by your nausea medication, call the clinic.   BELOW ARE SYMPTOMS THAT SHOULD BE REPORTED IMMEDIATELY:  *FEVER GREATER THAN 100.5 F  *CHILLS WITH OR WITHOUT FEVER  NAUSEA AND VOMITING THAT IS NOT CONTROLLED WITH YOUR NAUSEA MEDICATION  *UNUSUAL SHORTNESS OF BREATH  *UNUSUAL BRUISING OR BLEEDING  TENDERNESS IN MOUTH AND THROAT WITH OR WITHOUT PRESENCE OF ULCERS  *URINARY PROBLEMS  *BOWEL PROBLEMS  UNUSUAL RASH Items with * indicate a potential emergency and should be followed up as soon as possible.  Feel free to call the clinic you have any questions or concerns. The clinic phone number is [phone].

## 2014-07-12 NOTE — Progress Notes (Signed)
Feels good today , ambulatory , drove self to appt.

## 2014-07-13 ENCOUNTER — Ambulatory Visit (HOSPITAL_BASED_OUTPATIENT_CLINIC_OR_DEPARTMENT_OTHER): Payer: Medicare Other

## 2014-07-13 VITALS — BP 120/71 | HR 118 | Temp 97.5°F | Resp 20

## 2014-07-13 DIAGNOSIS — C3490 Malignant neoplasm of unspecified part of unspecified bronchus or lung: Secondary | ICD-10-CM

## 2014-07-13 DIAGNOSIS — C7A1 Malignant poorly differentiated neuroendocrine tumors: Secondary | ICD-10-CM

## 2014-07-13 DIAGNOSIS — Z5189 Encounter for other specified aftercare: Secondary | ICD-10-CM

## 2014-07-13 MED ORDER — PEGFILGRASTIM INJECTION 6 MG/0.6ML
6.0000 mg | Freq: Once | SUBCUTANEOUS | Status: AC
  Administered 2014-07-13: 6 mg via SUBCUTANEOUS

## 2014-07-13 NOTE — Patient Instructions (Signed)

## 2014-07-15 LAB — TYPE AND SCREEN
ABO/RH(D): O POS
ANTIBODY SCREEN: NEGATIVE
Unit division: 0
Unit division: 0

## 2014-07-15 NOTE — Progress Notes (Signed)
[place] [phone]   [phone]  OFFICE PROGRESS NOTE  [name], MD [place] [address]  DIAGNOSIS: Extensive stage small cell lung cancer presenting with large mediastinal mass with bilateral mediastinal lymphadenopathy as well as bilateral pulmonary nodules and supraclavicular lymph nodes in addition to questionable malignant pericardial effusion diagnosed in April of 2015.  PRIOR THERAPY:   CURRENT THERAPY: Systemic chemotherapy with carboplatin for AUC of 5 on day 1 and etoposide 120 mg/M2 on days 1, 2 and 3 with Neulasta support on day 4. Status post 3 cycles.  INTERVAL HISTORY: Frances Gonzalez 79 y.o. female returns to the clinic today for followup visit accompanied her sister and brother. The patient is feeling fine today with no specific complaints. She is tolerating her systemic chemotherapy with carboplatin and etoposide fairly well with no significant adverse effects except for increasing fatigue with mild shortness of breath. Overall she feels better. She denied having any significant fever or chills, no nausea or vomiting. She denied having any significant chest pain , no cough or hemoptysis. She presents to proceed with cycle #4 of her systemic chemotherapy with carboplatin and etoposide with Neulasta support. Her blood pressure is a bit low compared to previous visits. Her blood pressure is followed by her primary care physician, Dr. Stevie Kern.she states the original prescription for her blood pressure medication came from Dr. Melvyn Novas. She is not actively seeing Dr. Melvyn Novas at this time.   MEDICAL HISTORY: Past Medical History  Diagnosis Date  . Hypertension   . Lung cancer     dx'd 2010/2015    ALLERGIES:  has No Known Allergies.  MEDICATIONS:  Current Outpatient Prescriptions  Medication Sig Dispense Refill  . dextromethorphan (DELSYM) 30 MG/5ML liquid Take 60 mg by mouth 2 (two) times daily.        . irbesartan (AVAPRO) 150 MG tablet 150 mg. Half tablet daily      . lidocaine-prilocaine (EMLA) cream Apply 1 application topically as needed.  30 g  0  . loratadine (CLARITIN) 10 MG tablet Take 10 mg by mouth daily as needed for allergies.      . Multiple Vitamins-Minerals (MULTIVITAMINS THER. W/MINERALS) TABS Take 1 tablet by mouth daily.       Marland Kitchen OVER THE COUNTER MEDICATION Target allergy brand      . pantoprazole (PROTONIX) 40 MG tablet Take 40 mg by mouth daily.      . prochlorperazine (COMPAZINE) 10 MG tablet Take 1 tablet (10 mg total) by mouth every 6 (six) hours as needed for nausea or vomiting.  60 tablet  0  . simvastatin (ZOCOR) 20 MG tablet Take 10 mg by mouth every evening.      Marland Kitchen tetrahydrozoline 0.05 % ophthalmic solution Place 1 drop into both eyes daily as needed (red eyes).       No current facility-administered medications for this visit.    SURGICAL HISTORY:  Past Surgical History  Procedure Laterality Date  . Lung cancer surgery  02/24/04    RUL Dr Arlyce Dice    REVIEW OF SYSTEMS:  Constitutional: positive for fatigue Eyes: negative Ears, nose, mouth, throat, and face: negative Respiratory: positive for dyspnea on exertion Cardiovascular: negative Gastrointestinal: negative Genitourinary:negative Integument/breast: negative Hematologic/lymphatic: negative Musculoskeletal:negative Neurological: negative Behavioral/Psych: negative Endocrine: negative Allergic/Immunologic: negative   PHYSICAL EXAMINATION: General appearance: alert, cooperative and no distress Head: Normocephalic, without obvious abnormality, atraumatic Neck: no adenopathy, no JVD, supple, symmetrical, trachea midline  and thyroid not enlarged, symmetric, no tenderness/mass/nodules Lymph nodes: Cervical, supraclavicular, and axillary nodes normal. Resp: clear to auscultation bilaterally Back: symmetric, no curvature. ROM normal. No CVA tenderness. Cardio: regular rate and rhythm, S1, S2  normal, no murmur, click, rub or gallop GI: soft, non-tender; bowel sounds normal; no masses,  no organomegaly Extremities: extremities normal, atraumatic, no cyanosis or edema Neurologic: Alert and oriented X 3, normal strength and tone. Normal symmetric reflexes. Normal coordination and gait  ECOG PERFORMANCE STATUS: 1 - Symptomatic but completely ambulatory  Blood pressure 105/60, pulse 105, temperature 97.6 F (36.4 C), resp. rate 15, height 5\' 1"  (1.549 m), weight 136 lb 8 oz (61.916 kg).  LABORATORY DATA: Lab Results  Component Value Date   WBC 8.9 07/10/2014   HGB 8.4* 07/10/2014   HCT 24.3* 07/10/2014   MCV 91.6 07/10/2014   PLT 233 07/10/2014      Chemistry      Component Value Date/Time   NA 136 07/10/2014 1306   NA 132* 04/05/2014 1705   K 4.1 07/10/2014 1306   K 4.4 04/05/2014 1705   CL 98 04/05/2014 1705   CO2 23 07/10/2014 1306   CO2 26 04/05/2014 1705   BUN 11.2 07/10/2014 1306   BUN 11 04/05/2014 1705   CREATININE 0.8 07/10/2014 1306   CREATININE 0.6 04/05/2014 1705      Component Value Date/Time   CALCIUM 8.4 07/10/2014 1306   CALCIUM 9.6 04/05/2014 1705   ALKPHOS 94 07/10/2014 1306   ALKPHOS 61 01/16/2013 1411   AST 15 07/10/2014 1306   AST 23 01/16/2013 1411   ALT 8 07/10/2014 1306   ALT 17 01/16/2013 1411   BILITOT 0.42 07/10/2014 1306   BILITOT 0.6 01/16/2013 1411       RADIOGRAPHIC STUDIES: Ct Chest W Contrast  06/12/2014   CLINICAL DATA:  Followup recurrent small cell lung carcinoma. Chemotherapy and progress. Restaging.  EXAM: CT CHEST, ABDOMEN, AND PELVIS WITH CONTRAST  TECHNIQUE: Multidetector CT imaging of the chest, abdomen and pelvis was performed following the standard protocol during bolus administration of intravenous contrast.  CONTRAST:  124mL OMNIPAQUE IOHEXOL 300 MG/ML  SOLN  COMPARISON:  PET-CT on 04/17/2014  FINDINGS: CT CHEST FINDINGS  There has been significant decrease in bulky mediastinal lymphadenopathy since prior exam. Index lymphadenopathy in  the subcarinal region currently measures 3.2 x 3.9 cm on image 32 compared to 5.8 x 5.8 cm previously. A sub-cm right supraclavicular lymph node shows interval decrease in size. No new or increased areas of lymphadenopathy are seen within the thorax. Small hiatal hernia and small pericardial effusion again noted.  No evidence pleural effusion. Pulmonary emphysema again demonstrated. Previously demonstrated masslike opacity in the suprahilar central left upper lobe has also decreased, measuring approximately 0.9 x 1.8 cm compared to 1.4 x 2.0 cm previously. Previously seen nodular density in the right upper lobe is also decreased. 6 mm nodule in the left lung base on image 50 remains unchanged. No new or enlarging pulmonary nodules or masses are identified. No suspicious bone lesions identified.  CT ABDOMEN AND PELVIS FINDINGS  The liver, pancreas, spleen, and kidneys remain normal in appearance. No evidence hydronephrosis.  Left adrenal mass remains stable and consistent with benign adrenal adenoma. No other soft tissue masses or lymphadenopathy identified within the abdomen or pelvis. Uterus and adnexal regions are unremarkable.  No evidence of inflammatory process or abnormal fluid collections. No evidence of bowel wall thickening or dilatation. No suspicious bone lesions identified. Old lower  thoracic and lumbar vertebral body compression fracture deformities again noted.  IMPRESSION: Significant decrease in bulky mediastinal and mild right supraclavicular lymphadenopathy, and bilateral pulmonary nodules.  No new or progressive disease identified. No evidence of abdominal or pelvic metastatic disease.   Electronically Signed   By: Earle Gell M.D.   On: 06/12/2014 14:25   ASSESSMENT AND PLAN: This is a very pleasant 79 years old white female recently diagnosed with extensive stage small cell lung cancer currently undergoing systemic chemotherapy with carboplatin and etoposide status post 3 cycles. Overall the  patient is tolerating her treatment well except for increasing fatigue most likely secondary to chemotherapy-induced anemia although hypotension may be playing a role as well.. Her last CT scan of the chest, abdomen and pelvis showed significant improvement in her disease in the chest with no evidence for metastatic disease in the abdomen or pelvis. Her labs have been reviewed and are within stable range with the exception of a hemoglobin of 8.4 g/dL. I will arrange to transfuse the patient 1 unit of packed red blood cells to address her chemotherapy-induced anemia She will proceed with cycle #4 of her systemic chemotherapy with carboplatin and etoposide with Neulasta support with out dose modification. She will continue weekly labs as scheduled. She'll followup with Dr. Julien Nordmann in 3 weeks with a restaging CT scan of her chest, abdomen and pelvis with contrast to reevaluate her disease, prior to cycle #5. She is encouraged to contact her primary care physician regarding reevaluation of her blood pressure medications. She was advised to call immediately if she has any concerning symptoms in the interval.  The patient voices understanding of current disease status and treatment options and is in agreement with the current care plan.  All questions were answered. The patient knows to call the clinic with any problems, questions or concerns. We can certainly see the patient much sooner if necessary.  Carlton Adam, PA-C   Disclaimer: This note was dictated with voice recognition software. Similar sounding words can inadvertently be transcribed and may not be corrected upon review.

## 2014-07-15 NOTE — Patient Instructions (Signed)
Continue labs and chemotherapy as scheduled Followup in 3 weeks with restaging CT scan of the chest, abdomen and pelvis to reevaluate her disease, prior to her next scheduled cycle of chemotherapy Get in touch with your primary care physician regarding reevaluation of your blood pressure medications.

## 2014-07-17 ENCOUNTER — Other Ambulatory Visit (HOSPITAL_BASED_OUTPATIENT_CLINIC_OR_DEPARTMENT_OTHER): Payer: Medicare Other

## 2014-07-17 DIAGNOSIS — J9859 Other diseases of mediastinum, not elsewhere classified: Secondary | ICD-10-CM

## 2014-07-17 DIAGNOSIS — C7A1 Malignant poorly differentiated neuroendocrine tumors: Secondary | ICD-10-CM

## 2014-07-17 LAB — CBC WITH DIFFERENTIAL/PLATELET
BASO%: 0.2 % (ref 0.0–2.0)
Basophils Absolute: 0 10*3/uL (ref 0.0–0.1)
EOS ABS: 0 10*3/uL (ref 0.0–0.5)
EOS%: 0.1 % (ref 0.0–7.0)
HCT: 26.5 % — ABNORMAL LOW (ref 34.8–46.6)
HGB: 9.1 g/dL — ABNORMAL LOW (ref 11.6–15.9)
LYMPH%: 5.4 % — AB (ref 14.0–49.7)
MCH: 31.9 pg (ref 25.1–34.0)
MCHC: 34.5 g/dL (ref 31.5–36.0)
MCV: 92.4 fL (ref 79.5–101.0)
MONO#: 0 10*3/uL — AB (ref 0.1–0.9)
MONO%: 0.4 % (ref 0.0–14.0)
NEUT%: 93.9 % — ABNORMAL HIGH (ref 38.4–76.8)
NEUTROS ABS: 8.8 10*3/uL — AB (ref 1.5–6.5)
Platelets: 135 10*3/uL — ABNORMAL LOW (ref 145–400)
RBC: 2.87 10*6/uL — ABNORMAL LOW (ref 3.70–5.45)
RDW: 19.8 % — AB (ref 11.2–14.5)
WBC: 9.3 10*3/uL (ref 3.9–10.3)
lymph#: 0.5 10*3/uL — ABNORMAL LOW (ref 0.9–3.3)

## 2014-07-17 LAB — COMPREHENSIVE METABOLIC PANEL (CC13)
ALK PHOS: 111 U/L (ref 40–150)
ALT: 11 U/L (ref 0–55)
AST: 17 U/L (ref 5–34)
Albumin: 3.6 g/dL (ref 3.5–5.0)
Anion Gap: 7 mEq/L (ref 3–11)
BILIRUBIN TOTAL: 0.82 mg/dL (ref 0.20–1.20)
BUN: 16.7 mg/dL (ref 7.0–26.0)
CO2: 26 meq/L (ref 22–29)
Calcium: 9.2 mg/dL (ref 8.4–10.4)
Chloride: 99 mEq/L (ref 98–109)
Creatinine: 0.7 mg/dL (ref 0.6–1.1)
Glucose: 87 mg/dl (ref 70–140)
POTASSIUM: 4.5 meq/L (ref 3.5–5.1)
SODIUM: 132 meq/L — AB (ref 136–145)
TOTAL PROTEIN: 6.6 g/dL (ref 6.4–8.3)

## 2014-07-24 ENCOUNTER — Ambulatory Visit (HOSPITAL_COMMUNITY)
Admission: RE | Admit: 2014-07-24 | Discharge: 2014-07-24 | Disposition: A | Discharge status: Home / Self Care | Payer: Medicare Other | Source: Ambulatory Visit | Attending: Physician Assistant | Admitting: Physician Assistant

## 2014-07-24 ENCOUNTER — Other Ambulatory Visit: Payer: Medicare Other

## 2014-07-24 ENCOUNTER — Encounter (HOSPITAL_COMMUNITY): Payer: Self-pay

## 2014-07-24 ENCOUNTER — Other Ambulatory Visit (HOSPITAL_BASED_OUTPATIENT_CLINIC_OR_DEPARTMENT_OTHER): Payer: Medicare Other

## 2014-07-24 DIAGNOSIS — M959 Acquired deformity of musculoskeletal system, unspecified: Secondary | ICD-10-CM | POA: Insufficient documentation

## 2014-07-24 DIAGNOSIS — R0602 Shortness of breath: Secondary | ICD-10-CM | POA: Insufficient documentation

## 2014-07-24 DIAGNOSIS — M479 Spondylosis, unspecified: Secondary | ICD-10-CM | POA: Diagnosis not present

## 2014-07-24 DIAGNOSIS — J984 Other disorders of lung: Secondary | ICD-10-CM | POA: Diagnosis not present

## 2014-07-24 DIAGNOSIS — R911 Solitary pulmonary nodule: Secondary | ICD-10-CM | POA: Insufficient documentation

## 2014-07-24 DIAGNOSIS — I2510 Atherosclerotic heart disease of native coronary artery without angina pectoris: Secondary | ICD-10-CM | POA: Diagnosis not present

## 2014-07-24 DIAGNOSIS — J9859 Other diseases of mediastinum, not elsewhere classified: Secondary | ICD-10-CM

## 2014-07-24 DIAGNOSIS — C7A1 Malignant poorly differentiated neuroendocrine tumors: Secondary | ICD-10-CM

## 2014-07-24 DIAGNOSIS — Z9221 Personal history of antineoplastic chemotherapy: Secondary | ICD-10-CM | POA: Insufficient documentation

## 2014-07-24 DIAGNOSIS — I7090 Unspecified atherosclerosis: Secondary | ICD-10-CM | POA: Insufficient documentation

## 2014-07-24 DIAGNOSIS — E278 Other specified disorders of adrenal gland: Secondary | ICD-10-CM | POA: Insufficient documentation

## 2014-07-24 DIAGNOSIS — D6481 Anemia due to antineoplastic chemotherapy: Secondary | ICD-10-CM

## 2014-07-24 DIAGNOSIS — J438 Other emphysema: Secondary | ICD-10-CM | POA: Diagnosis not present

## 2014-07-24 DIAGNOSIS — C3490 Malignant neoplasm of unspecified part of unspecified bronchus or lung: Secondary | ICD-10-CM | POA: Diagnosis present

## 2014-07-24 DIAGNOSIS — K449 Diaphragmatic hernia without obstruction or gangrene: Secondary | ICD-10-CM | POA: Insufficient documentation

## 2014-07-24 DIAGNOSIS — T451X5A Adverse effect of antineoplastic and immunosuppressive drugs, initial encounter: Secondary | ICD-10-CM

## 2014-07-24 LAB — CBC WITH DIFFERENTIAL/PLATELET
BASO%: 0.3 % (ref 0.0–2.0)
Basophils Absolute: 0 10*3/uL (ref 0.0–0.1)
EOS%: 0.3 % (ref 0.0–7.0)
Eosinophils Absolute: 0 10*3/uL (ref 0.0–0.5)
HEMATOCRIT: 24.3 % — AB (ref 34.8–46.6)
HGB: 8.2 g/dL — ABNORMAL LOW (ref 11.6–15.9)
LYMPH%: 12.1 % — AB (ref 14.0–49.7)
MCH: 31.5 pg (ref 25.1–34.0)
MCHC: 33.8 g/dL (ref 31.5–36.0)
MCV: 93 fL (ref 79.5–101.0)
MONO#: 0.7 10*3/uL (ref 0.1–0.9)
MONO%: 10.7 % (ref 0.0–14.0)
NEUT#: 4.8 10*3/uL (ref 1.5–6.5)
NEUT%: 76.6 % (ref 38.4–76.8)
PLATELETS: 52 10*3/uL — AB (ref 145–400)
RBC: 2.61 10*6/uL — ABNORMAL LOW (ref 3.70–5.45)
RDW: 19 % — ABNORMAL HIGH (ref 11.2–14.5)
WBC: 6.3 10*3/uL (ref 3.9–10.3)
lymph#: 0.8 10*3/uL — ABNORMAL LOW (ref 0.9–3.3)

## 2014-07-24 LAB — COMPREHENSIVE METABOLIC PANEL (CC13)
ALK PHOS: 106 U/L (ref 40–150)
ALT: 7 U/L (ref 0–55)
ANION GAP: 9 meq/L (ref 3–11)
AST: 18 U/L (ref 5–34)
Albumin: 3.9 g/dL (ref 3.5–5.0)
BILIRUBIN TOTAL: 0.36 mg/dL (ref 0.20–1.20)
BUN: 6.6 mg/dL — ABNORMAL LOW (ref 7.0–26.0)
CO2: 25 meq/L (ref 22–29)
CREATININE: 0.7 mg/dL (ref 0.6–1.1)
Calcium: 7.8 mg/dL — ABNORMAL LOW (ref 8.4–10.4)
Chloride: 98 mEq/L (ref 98–109)
Glucose: 75 mg/dl (ref 70–140)
Potassium: 3.9 mEq/L (ref 3.5–5.1)
SODIUM: 132 meq/L — AB (ref 136–145)
TOTAL PROTEIN: 6.8 g/dL (ref 6.4–8.3)

## 2014-07-24 MED ORDER — IOHEXOL 300 MG/ML  SOLN
100.0000 mL | Freq: Once | INTRAMUSCULAR | Status: AC | PRN
  Administered 2014-07-24: 100 mL via INTRAVENOUS

## 2014-07-24 MED ORDER — IOHEXOL 300 MG/ML  SOLN
50.0000 mL | Freq: Once | INTRAMUSCULAR | Status: AC | PRN
  Administered 2014-07-24: 50 mL via ORAL

## 2014-07-31 ENCOUNTER — Encounter: Payer: Self-pay | Admitting: Internal Medicine

## 2014-07-31 ENCOUNTER — Ambulatory Visit (HOSPITAL_BASED_OUTPATIENT_CLINIC_OR_DEPARTMENT_OTHER): Payer: Medicare Other | Admitting: Physician Assistant

## 2014-07-31 ENCOUNTER — Other Ambulatory Visit (HOSPITAL_BASED_OUTPATIENT_CLINIC_OR_DEPARTMENT_OTHER): Payer: Medicare Other

## 2014-07-31 ENCOUNTER — Telehealth: Payer: Self-pay | Admitting: Internal Medicine

## 2014-07-31 ENCOUNTER — Ambulatory Visit (HOSPITAL_BASED_OUTPATIENT_CLINIC_OR_DEPARTMENT_OTHER): Payer: Medicare Other

## 2014-07-31 ENCOUNTER — Encounter: Payer: Self-pay | Admitting: Physician Assistant

## 2014-07-31 VITALS — BP 109/59 | HR 110 | Temp 97.9°F | Resp 18 | Ht 61.0 in | Wt 138.6 lb

## 2014-07-31 DIAGNOSIS — R0989 Other specified symptoms and signs involving the circulatory and respiratory systems: Secondary | ICD-10-CM

## 2014-07-31 DIAGNOSIS — C7A1 Malignant poorly differentiated neuroendocrine tumors: Secondary | ICD-10-CM

## 2014-07-31 DIAGNOSIS — D6481 Anemia due to antineoplastic chemotherapy: Secondary | ICD-10-CM

## 2014-07-31 DIAGNOSIS — Z5111 Encounter for antineoplastic chemotherapy: Secondary | ICD-10-CM

## 2014-07-31 DIAGNOSIS — C3490 Malignant neoplasm of unspecified part of unspecified bronchus or lung: Secondary | ICD-10-CM

## 2014-07-31 DIAGNOSIS — J9859 Other diseases of mediastinum, not elsewhere classified: Secondary | ICD-10-CM

## 2014-07-31 DIAGNOSIS — R5383 Other fatigue: Secondary | ICD-10-CM

## 2014-07-31 DIAGNOSIS — R5381 Other malaise: Secondary | ICD-10-CM

## 2014-07-31 DIAGNOSIS — R0609 Other forms of dyspnea: Secondary | ICD-10-CM

## 2014-07-31 DIAGNOSIS — T451X5A Adverse effect of antineoplastic and immunosuppressive drugs, initial encounter: Secondary | ICD-10-CM

## 2014-07-31 LAB — COMPREHENSIVE METABOLIC PANEL (CC13)
ALT: 7 U/L (ref 0–55)
AST: 17 U/L (ref 5–34)
Albumin: 3.8 g/dL (ref 3.5–5.0)
Alkaline Phosphatase: 88 U/L (ref 40–150)
Anion Gap: 9 mEq/L (ref 3–11)
BILIRUBIN TOTAL: 0.41 mg/dL (ref 0.20–1.20)
BUN: 10.1 mg/dL (ref 7.0–26.0)
CALCIUM: 8.7 mg/dL (ref 8.4–10.4)
CHLORIDE: 103 meq/L (ref 98–109)
CO2: 24 mEq/L (ref 22–29)
CREATININE: 0.8 mg/dL (ref 0.6–1.1)
Glucose: 101 mg/dl (ref 70–140)
Potassium: 3.8 mEq/L (ref 3.5–5.1)
Sodium: 136 mEq/L (ref 136–145)
Total Protein: 6.9 g/dL (ref 6.4–8.3)

## 2014-07-31 LAB — CBC WITH DIFFERENTIAL/PLATELET
BASO%: 0.3 % (ref 0.0–2.0)
BASOS ABS: 0 10*3/uL (ref 0.0–0.1)
EOS%: 0.1 % (ref 0.0–7.0)
Eosinophils Absolute: 0 10*3/uL (ref 0.0–0.5)
HEMATOCRIT: 24.9 % — AB (ref 34.8–46.6)
HEMOGLOBIN: 8.6 g/dL — AB (ref 11.6–15.9)
LYMPH%: 9.1 % — AB (ref 14.0–49.7)
MCH: 32.4 pg (ref 25.1–34.0)
MCHC: 34.4 g/dL (ref 31.5–36.0)
MCV: 94.4 fL (ref 79.5–101.0)
MONO#: 0.8 10*3/uL (ref 0.1–0.9)
MONO%: 10.7 % (ref 0.0–14.0)
NEUT#: 6.1 10*3/uL (ref 1.5–6.5)
NEUT%: 79.8 % — AB (ref 38.4–76.8)
PLATELETS: 227 10*3/uL (ref 145–400)
RBC: 2.64 10*6/uL — ABNORMAL LOW (ref 3.70–5.45)
RDW: 20.8 % — ABNORMAL HIGH (ref 11.2–14.5)
WBC: 7.6 10*3/uL (ref 3.9–10.3)
lymph#: 0.7 10*3/uL — ABNORMAL LOW (ref 0.9–3.3)

## 2014-07-31 MED ORDER — SODIUM CHLORIDE 0.9 % IV SOLN
360.0000 mg | Freq: Once | INTRAVENOUS | Status: AC
  Administered 2014-07-31: 360 mg via INTRAVENOUS
  Filled 2014-07-31: qty 36

## 2014-07-31 MED ORDER — DEXAMETHASONE SODIUM PHOSPHATE 20 MG/5ML IJ SOLN
20.0000 mg | Freq: Once | INTRAMUSCULAR | Status: AC
  Administered 2014-07-31: 20 mg via INTRAVENOUS

## 2014-07-31 MED ORDER — DEXAMETHASONE SODIUM PHOSPHATE 20 MG/5ML IJ SOLN
INTRAMUSCULAR | Status: AC
  Filled 2014-07-31: qty 5

## 2014-07-31 MED ORDER — HEPARIN SOD (PORK) LOCK FLUSH 100 UNIT/ML IV SOLN
500.0000 [IU] | Freq: Once | INTRAVENOUS | Status: AC | PRN
  Administered 2014-07-31: 500 [IU]
  Filled 2014-07-31: qty 5

## 2014-07-31 MED ORDER — ONDANSETRON 16 MG/50ML IVPB (CHCC)
16.0000 mg | Freq: Once | INTRAVENOUS | Status: AC
  Administered 2014-07-31: 16 mg via INTRAVENOUS

## 2014-07-31 MED ORDER — SODIUM CHLORIDE 0.9 % IV SOLN
Freq: Once | INTRAVENOUS | Status: AC
  Administered 2014-07-31: 15:00:00 via INTRAVENOUS

## 2014-07-31 MED ORDER — SODIUM CHLORIDE 0.9 % IV SOLN
120.0000 mg/m2 | Freq: Once | INTRAVENOUS | Status: AC
  Administered 2014-07-31: 200 mg via INTRAVENOUS
  Filled 2014-07-31: qty 10

## 2014-07-31 MED ORDER — ONDANSETRON 16 MG/50ML IVPB (CHCC)
INTRAVENOUS | Status: AC
  Filled 2014-07-31: qty 16

## 2014-07-31 MED ORDER — SODIUM CHLORIDE 0.9 % IJ SOLN
10.0000 mL | INTRAMUSCULAR | Status: DC | PRN
  Administered 2014-07-31: 10 mL
  Filled 2014-07-31: qty 10

## 2014-07-31 NOTE — Progress Notes (Addendum)
[place] Telephone:(336) [phone]   Fax:(336) [phone]  SHARED VISIT PROGRESS NOTE  Frances Man, MD [place] [address]  DIAGNOSIS: Extensive stage small cell lung cancer presenting with large mediastinal mass with bilateral mediastinal lymphadenopathy as well as bilateral pulmonary nodules and supraclavicular lymph nodes in addition to questionable malignant pericardial effusion diagnosed in April of 2015.  PRIOR THERAPY:   CURRENT THERAPY: Systemic chemotherapy with carboplatin for AUC of 5 on day 1 and etoposide 120 mg/M2 on days 1, 2 and 3 with Neulasta support on day 4. Status post 4 cycles.  INTERVAL HISTORY: Frances Gonzalez 79 y.o. female returns to the clinic today for followup visit accompanied her sister.she's been tolerating her chemotherapy relatively well with the exception of fatigue that lasts until about 4 or 5 days before the next cycle of chemotherapy. She continues to have mild shortness of breath.  Overall she feels better. She denied having any significant fever or chills, no nausea or vomiting. She denied having any significant chest pain , no cough or hemoptysis. She presents to proceed with cycle #5 of her systemic chemotherapy with carboplatin and etoposide with Neulasta support. She recently had a restaging CT scan of her chest, abdomen and pelvis and presents to discuss the results. She reports that her blood pressure medications have been decreased by her primary care physician.  MEDICAL HISTORY: Past Medical History  Diagnosis Date  . Hypertension   . Lung cancer     dx'd 2010/2015    ALLERGIES:  has No Known Allergies.  MEDICATIONS:  Current Outpatient Prescriptions  Medication Sig Dispense Refill  . dextromethorphan (DELSYM) 30 MG/5ML liquid Take 60 mg by mouth 2 (two) times daily.       . irbesartan (AVAPRO) 150 MG tablet 150 mg. Half tablet daily      . lidocaine-prilocaine (EMLA) cream Apply 1  application topically as needed.  30 g  0  . loratadine (CLARITIN) 10 MG tablet Take 10 mg by mouth daily as needed for allergies.      . Multiple Vitamins-Minerals (MULTIVITAMINS THER. W/MINERALS) TABS Take 1 tablet by mouth daily.       Marland Kitchen OVER THE COUNTER MEDICATION Target allergy brand      . pantoprazole (PROTONIX) 40 MG tablet Take 40 mg by mouth daily.      . prochlorperazine (COMPAZINE) 10 MG tablet Take 1 tablet (10 mg total) by mouth every 6 (six) hours as needed for nausea or vomiting.  60 tablet  0  . simvastatin (ZOCOR) 20 MG tablet Take 10 mg by mouth every evening.      Marland Kitchen tetrahydrozoline 0.05 % ophthalmic solution Place 1 drop into both eyes daily as needed (red eyes).       No current facility-administered medications for this visit.   Facility-Administered Medications Ordered in Other Visits  Medication Dose Route Frequency Provider Last Rate Last Dose  . etoposide (VEPESID) 200 mg in sodium chloride 0.9 % 500 mL chemo infusion  120 mg/m2 (Treatment Plan Actual) Intravenous Once Carlton Adam, PA-C 510 mL/hr at 07/31/14 1607 200 mg at 07/31/14 1607  . heparin lock flush 100 unit/mL  500 Units Intracatheter Once PRN [place] Nordquist E Laren Whaling, PA-C      . sodium chloride 0.9 % injection 10 mL  10 mL Intracatheter PRN Carlton Adam, PA-C        SURGICAL HISTORY:  Past Surgical History  Procedure Laterality Date  .  Lung cancer surgery  02/24/04    RUL Dr Arlyce Dice    REVIEW OF SYSTEMS:  Constitutional: positive for fatigue Eyes: negative Ears, nose, mouth, throat, and face: negative Respiratory: positive for dyspnea on exertion Cardiovascular: negative Gastrointestinal: negative Genitourinary:negative Integument/breast: negative Hematologic/lymphatic: negative Musculoskeletal:negative Neurological: negative Behavioral/Psych: negative Endocrine: negative Allergic/Immunologic: negative   PHYSICAL EXAMINATION: General appearance: alert, cooperative and no distress Head:  Normocephalic, without obvious abnormality, atraumatic Neck: no adenopathy, no JVD, supple, symmetrical, trachea midline and thyroid not enlarged, symmetric, no tenderness/mass/nodules Lymph nodes: Cervical, supraclavicular, and axillary nodes normal. Resp: clear to auscultation bilaterally Back: symmetric, no curvature. ROM normal. No CVA tenderness. Cardio: regular rate and rhythm, S1, S2 normal, no murmur, click, rub or gallop GI: soft, non-tender; bowel sounds normal; no masses,  no organomegaly Extremities: extremities normal, atraumatic, no cyanosis or edema Neurologic: Alert and oriented X 3, normal strength and tone. Normal symmetric reflexes. Normal coordination and gait  ECOG PERFORMANCE STATUS: 1 - Symptomatic but completely ambulatory  Blood pressure 109/59, pulse 110, temperature 97.9 F (36.6 C), temperature source Oral, resp. rate 18, height 5\' 1"  (1.549 m), weight 138 lb 9.6 oz (62.869 kg), SpO2 98.00%.  LABORATORY DATA: Lab Results  Component Value Date   WBC 7.6 07/31/2014   HGB 8.6* 07/31/2014   HCT 24.9* 07/31/2014   MCV 94.4 07/31/2014   PLT 227 07/31/2014      Chemistry      Component Value Date/Time   NA 136 07/31/2014 1308   NA 132* 04/05/2014 1705   K 3.8 07/31/2014 1308   K 4.4 04/05/2014 1705   CL 98 04/05/2014 1705   CO2 24 07/31/2014 1308   CO2 26 04/05/2014 1705   BUN 10.1 07/31/2014 1308   BUN 11 04/05/2014 1705   CREATININE 0.8 07/31/2014 1308   CREATININE 0.6 04/05/2014 1705      Component Value Date/Time   CALCIUM 8.7 07/31/2014 1308   CALCIUM 9.6 04/05/2014 1705   ALKPHOS 88 07/31/2014 1308   ALKPHOS 61 01/16/2013 1411   AST 17 07/31/2014 1308   AST 23 01/16/2013 1411   ALT 7 07/31/2014 1308   ALT 17 01/16/2013 1411   BILITOT 0.41 07/31/2014 1308   BILITOT 0.6 01/16/2013 1411       RADIOGRAPHIC STUDIES: Ct Chest W Contrast  07/24/2014   CLINICAL DATA:  Restaging small cell lung cancer. Chemotherapy and progress. Shortness of breath.  EXAM: CT CHEST, ABDOMEN, AND  PELVIS WITH CONTRAST  TECHNIQUE: Multidetector CT imaging of the chest, abdomen and pelvis was performed following the standard protocol during bolus administration of intravenous contrast.  CONTRAST:  16mL OMNIPAQUE IOHEXOL 300 MG/ML  SOLN  COMPARISON:  06/12/2014 and PET 04/17/2014.  FINDINGS: CT CHEST FINDINGS  Right IJ Port-A-Cath terminates in the high right atrium. Residual ill-defined soft tissue in the subcarinal region measures approximately 2.5 x 3.6 cm (previously 3.2 x 3.9 cm). No hilar or axillary adenopathy. Coronary artery calcification. Heart size within normal limits. Small amount of pericardial fluid, as before. Small hiatal hernia.  Biapical pleural parenchymal scarring, right greater than left. Postoperative changes and volume loss in the right hemi thorax. Mild centrilobular emphysema. Mild superimposed subpleural reticulation, right greater than left. A nodular lesion in the medial left upper lobe measures approximately 8 mm (previously 0.9 x 1.8 cm). Subpleural left lower lobe nodule measures 7 mm, stable. No pleural fluid. Airway is otherwise unremarkable.  CT ABDOMEN AND PELVIS FINDINGS  Liver, gallbladder and right adrenal gland are unremarkable.  Left adrenal nodule measures 1.6 x 2.4 cm and was fluid in density on 04/17/2014. Low-attenuation lesions in the right kidney measure up to 6 mm and are likely cysts. Spleen, pancreas, stomach and bowel are otherwise unremarkable.  Uterus is visualized. Bladder is unremarkable. No pathologically enlarged lymph nodes. No free fluid. Atherosclerotic calcification of the arterial vasculature without abdominal aortic aneurysm. No worrisome lytic or sclerotic lesions. Degenerative changes are seen in the spine. Compression deformities involving T11, L1, L3 and L5 are unchanged.  IMPRESSION: 1. Continued improvement in an ill-defined subcarinal nodal mass and a nodular lesion in the medial left upper lobe. 2. Coronary artery calcification. 3. Question  mild subpleural fibrosis. 4. Left adrenal adenoma.   Electronically Signed   By: Lorin Picket M.D.   On: 07/24/2014 15:54   Ct Abdomen Pelvis W Contrast  07/24/2014   CLINICAL DATA:  Restaging small cell lung cancer. Chemotherapy and progress. Shortness of breath.  EXAM: CT CHEST, ABDOMEN, AND PELVIS WITH CONTRAST  TECHNIQUE: Multidetector CT imaging of the chest, abdomen and pelvis was performed following the standard protocol during bolus administration of intravenous contrast.  CONTRAST:  25mL OMNIPAQUE IOHEXOL 300 MG/ML  SOLN  COMPARISON:  06/12/2014 and PET 04/17/2014.  FINDINGS: CT CHEST FINDINGS  Right IJ Port-A-Cath terminates in the high right atrium. Residual ill-defined soft tissue in the subcarinal region measures approximately 2.5 x 3.6 cm (previously 3.2 x 3.9 cm). No hilar or axillary adenopathy. Coronary artery calcification. Heart size within normal limits. Small amount of pericardial fluid, as before. Small hiatal hernia.  Biapical pleural parenchymal scarring, right greater than left. Postoperative changes and volume loss in the right hemi thorax. Mild centrilobular emphysema. Mild superimposed subpleural reticulation, right greater than left. A nodular lesion in the medial left upper lobe measures approximately 8 mm (previously 0.9 x 1.8 cm). Subpleural left lower lobe nodule measures 7 mm, stable. No pleural fluid. Airway is otherwise unremarkable.  CT ABDOMEN AND PELVIS FINDINGS  Liver, gallbladder and right adrenal gland are unremarkable. Left adrenal nodule measures 1.6 x 2.4 cm and was fluid in density on 04/17/2014. Low-attenuation lesions in the right kidney measure up to 6 mm and are likely cysts. Spleen, pancreas, stomach and bowel are otherwise unremarkable.  Uterus is visualized. Bladder is unremarkable. No pathologically enlarged lymph nodes. No free fluid. Atherosclerotic calcification of the arterial vasculature without abdominal aortic aneurysm. No worrisome lytic or  sclerotic lesions. Degenerative changes are seen in the spine. Compression deformities involving T11, L1, L3 and L5 are unchanged.  IMPRESSION: 1. Continued improvement in an ill-defined subcarinal nodal mass and a nodular lesion in the medial left upper lobe. 2. Coronary artery calcification. 3. Question mild subpleural fibrosis. 4. Left adrenal adenoma.   Electronically Signed   By: Lorin Picket M.D.   On: 07/24/2014 15:54   ASSESSMENT AND PLAN: This is a very pleasant 79 years old white female recently diagnosed with extensive stage small cell lung cancer currently undergoing systemic chemotherapy with carboplatin and etoposide status post 4 cycles. Overall the patient is tolerating her treatment well except for increasing fatigue most likely secondary to chemotherapy-induced anemia although hypotension may be playing a role as well. Her last CT scan of the chest, abdomen and pelvis showed continued improvement in her disease in the chest with no evidence for metastatic disease in the abdomen or pelvis. Her labs have been reviewed and are within stable range with the exception of a hemoglobin of 8.6 g/dL. Patient was discussed with  and also seen by Dr. Julien Nordmann. Her CT scan results were reviewed and we recommend that she proceed with an additional 2 more cycles of chemotherapy with carboplatin and etoposide with Neulasta support. Patient was in agreement to proceed with the additional 2 cycles of chemotherapy. She'll proceed with cycle #5 today as scheduled. We will continue to monitor her hemoglobin closely on her weekly labs and will arrange for packed red blood cell transfusion as needed. She'll followup in 3 weeks prior to cycle #6.  She was advised to call immediately if she has any concerning symptoms in the interval.  The patient voices understanding of current disease status and treatment options and is in agreement with the current care plan.  All questions were answered. The patient knows to  call the clinic with any problems, questions or concerns. We can certainly see the patient much sooner if necessary.  Carlton Adam PA-C  ADDENDUM: Hematology/Oncology Attending: I had a face to face encounter with the patient. I recommended his care plan. This is a very pleasant 79 years old white female with extensive stage small cell lung cancer currently undergoing systemic chemotherapy with carboplatin and etoposide status post 4 cycles. Her recent CT scan of the chest, abdomen and pelvis showed continuous improvement in her disease. I had a lengthy discussion with the patient and her sister today about her current disease status and treatment options. I gave the patient the option of stopping the treatment at this point and close observation versus proceeding with 2 additional cycles of chemotherapy since she continues to have improvement in her disease. The patient would like to continue with 2 more cycles of chemotherapy as recommended. She was started cycle #5 today and she would come back for followup visit in 3 weeks with the next cycle of her treatment. She was advised to call immediately if she has any concerning symptoms in the interval.  Disclaimer: This note was dictated with voice recognition software. Similar sounding words can inadvertently be transcribed and may not be corrected upon review. Disclaimer: This note was dictated with voice recognition software. Similar sounding words can inadvertently be transcribed and may be missed upon review. Eilleen Kempf., MD 08/09/2014

## 2014-07-31 NOTE — Telephone Encounter (Signed)
gv adn rpitned appt sched and avs fo rpt for Aug...sed adjusted tx

## 2014-07-31 NOTE — Patient Instructions (Signed)
[facility] Discharge Instructions for Patients Receiving Chemotherapy  Today you received the following chemotherapy agents carboplatin ,[id number] To help prevent nausea and vomiting after your treatment, we encourage you to take your nausea medication Compazine  If you develop nausea and vomiting that is not controlled by your nausea medication, call the clinic.   BELOW ARE SYMPTOMS THAT SHOULD BE REPORTED IMMEDIATELY:  *FEVER GREATER THAN 100.5 F  *CHILLS WITH OR WITHOUT FEVER  NAUSEA AND VOMITING THAT IS NOT CONTROLLED WITH YOUR NAUSEA MEDICATION  *UNUSUAL SHORTNESS OF BREATH  *UNUSUAL BRUISING OR BLEEDING  TENDERNESS IN MOUTH AND THROAT WITH OR WITHOUT PRESENCE OF ULCERS  *URINARY PROBLEMS  *BOWEL PROBLEMS  UNUSUAL RASH Items with * indicate a potential emergency and should be followed up as soon as possible.  Feel free to call the clinic you have any questions or concerns. The clinic phone number is [phone].

## 2014-08-01 ENCOUNTER — Ambulatory Visit (HOSPITAL_BASED_OUTPATIENT_CLINIC_OR_DEPARTMENT_OTHER): Payer: Medicare Other

## 2014-08-01 VITALS — BP 126/78 | HR 107 | Temp 97.5°F | Resp 20

## 2014-08-01 DIAGNOSIS — C7A1 Malignant poorly differentiated neuroendocrine tumors: Secondary | ICD-10-CM

## 2014-08-01 DIAGNOSIS — C3490 Malignant neoplasm of unspecified part of unspecified bronchus or lung: Secondary | ICD-10-CM

## 2014-08-01 DIAGNOSIS — Z5111 Encounter for antineoplastic chemotherapy: Secondary | ICD-10-CM

## 2014-08-01 MED ORDER — ONDANSETRON 8 MG/50ML IVPB (CHCC)
8.0000 mg | Freq: Once | INTRAVENOUS | Status: AC
  Administered 2014-08-01: 8 mg via INTRAVENOUS

## 2014-08-01 MED ORDER — DEXAMETHASONE SODIUM PHOSPHATE 10 MG/ML IJ SOLN
INTRAMUSCULAR | Status: AC
  Filled 2014-08-01: qty 1

## 2014-08-01 MED ORDER — ONDANSETRON 8 MG/NS 50 ML IVPB
INTRAVENOUS | Status: AC
  Filled 2014-08-01: qty 8

## 2014-08-01 MED ORDER — SODIUM CHLORIDE 0.9 % IV SOLN
120.0000 mg/m2 | Freq: Once | INTRAVENOUS | Status: AC
  Administered 2014-08-01: 200 mg via INTRAVENOUS
  Filled 2014-08-01: qty 10

## 2014-08-01 MED ORDER — HEPARIN SOD (PORK) LOCK FLUSH 100 UNIT/ML IV SOLN
500.0000 [IU] | Freq: Once | INTRAVENOUS | Status: AC | PRN
  Administered 2014-08-01: 500 [IU]
  Filled 2014-08-01: qty 5

## 2014-08-01 MED ORDER — DEXAMETHASONE SODIUM PHOSPHATE 10 MG/ML IJ SOLN
10.0000 mg | Freq: Once | INTRAMUSCULAR | Status: AC
  Administered 2014-08-01: 10 mg via INTRAVENOUS

## 2014-08-01 MED ORDER — SODIUM CHLORIDE 0.9 % IJ SOLN
10.0000 mL | INTRAMUSCULAR | Status: DC | PRN
  Administered 2014-08-01: 10 mL
  Filled 2014-08-01: qty 10

## 2014-08-01 MED ORDER — SODIUM CHLORIDE 0.9 % IV SOLN
Freq: Once | INTRAVENOUS | Status: AC
  Administered 2014-08-01: 13:00:00 via INTRAVENOUS

## 2014-08-01 NOTE — Patient Instructions (Signed)
[facility] Discharge Instructions for Patients Receiving Chemotherapy  Today you received the following chemotherapy agents VP 16  To help prevent nausea and vomiting after your treatment, we encourage you to take your nausea medication as needed   If you develop nausea and vomiting that is not controlled by your nausea medication, call the clinic.   BELOW ARE SYMPTOMS THAT SHOULD BE REPORTED IMMEDIATELY:  *FEVER GREATER THAN 100.5 F  *CHILLS WITH OR WITHOUT FEVER  NAUSEA AND VOMITING THAT IS NOT CONTROLLED WITH YOUR NAUSEA MEDICATION  *UNUSUAL SHORTNESS OF BREATH  *UNUSUAL BRUISING OR BLEEDING  TENDERNESS IN MOUTH AND THROAT WITH OR WITHOUT PRESENCE OF ULCERS  *URINARY PROBLEMS  *BOWEL PROBLEMS  UNUSUAL RASH Items with * indicate a potential emergency and should be followed up as soon as possible.  Feel free to call the clinic you have any questions or concerns. The clinic phone number is [phone].

## 2014-08-02 ENCOUNTER — Ambulatory Visit (HOSPITAL_BASED_OUTPATIENT_CLINIC_OR_DEPARTMENT_OTHER): Payer: Medicare Other

## 2014-08-02 VITALS — BP 135/92 | HR 99 | Temp 97.3°F | Resp 20

## 2014-08-02 DIAGNOSIS — Z5111 Encounter for antineoplastic chemotherapy: Secondary | ICD-10-CM

## 2014-08-02 DIAGNOSIS — C7A1 Malignant poorly differentiated neuroendocrine tumors: Secondary | ICD-10-CM

## 2014-08-02 DIAGNOSIS — C3490 Malignant neoplasm of unspecified part of unspecified bronchus or lung: Secondary | ICD-10-CM

## 2014-08-02 MED ORDER — SODIUM CHLORIDE 0.9 % IV SOLN
120.0000 mg/m2 | Freq: Once | INTRAVENOUS | Status: AC
  Administered 2014-08-02: 200 mg via INTRAVENOUS
  Filled 2014-08-02: qty 10

## 2014-08-02 MED ORDER — HEPARIN SOD (PORK) LOCK FLUSH 100 UNIT/ML IV SOLN
500.0000 [IU] | Freq: Once | INTRAVENOUS | Status: AC | PRN
  Administered 2014-08-02: 500 [IU]
  Filled 2014-08-02: qty 5

## 2014-08-02 MED ORDER — SODIUM CHLORIDE 0.9 % IV SOLN
Freq: Once | INTRAVENOUS | Status: AC
  Administered 2014-08-02: 13:00:00 via INTRAVENOUS

## 2014-08-02 MED ORDER — ONDANSETRON 8 MG/NS 50 ML IVPB
INTRAVENOUS | Status: AC
  Filled 2014-08-02: qty 8

## 2014-08-02 MED ORDER — ONDANSETRON 8 MG/50ML IVPB (CHCC)
8.0000 mg | Freq: Once | INTRAVENOUS | Status: AC
  Administered 2014-08-02: 8 mg via INTRAVENOUS

## 2014-08-02 MED ORDER — DEXAMETHASONE SODIUM PHOSPHATE 10 MG/ML IJ SOLN
INTRAMUSCULAR | Status: AC
  Filled 2014-08-02: qty 1

## 2014-08-02 MED ORDER — SODIUM CHLORIDE 0.9 % IJ SOLN
10.0000 mL | INTRAMUSCULAR | Status: DC | PRN
  Administered 2014-08-02: 10 mL
  Filled 2014-08-02: qty 10

## 2014-08-02 MED ORDER — DEXAMETHASONE SODIUM PHOSPHATE 10 MG/ML IJ SOLN
10.0000 mg | Freq: Once | INTRAMUSCULAR | Status: AC
  Administered 2014-08-02: 10 mg via INTRAVENOUS

## 2014-08-02 NOTE — Patient Instructions (Signed)
[facility] Discharge Instructions for Patients Receiving Chemotherapy  Today you received the following chemotherapy agents Etoposide  To help prevent nausea and vomiting after your treatment, we encourage you to take your nausea medication                 Compazine 10 mg every 6 hoursIf you develop nausea and vomiting that is not controlled by your nausea medication, call the clinic.   BELOW ARE SYMPTOMS THAT SHOULD BE REPORTED IMMEDIATELY:  *FEVER GREATER THAN 100.5 F  *CHILLS WITH OR WITHOUT FEVER  NAUSEA AND VOMITING THAT IS NOT CONTROLLED WITH YOUR NAUSEA MEDICATION  *UNUSUAL SHORTNESS OF BREATH  *UNUSUAL BRUISING OR BLEEDING  TENDERNESS IN MOUTH AND THROAT WITH OR WITHOUT PRESENCE OF ULCERS  *URINARY PROBLEMS  *BOWEL PROBLEMS  UNUSUAL RASH Items with * indicate a potential emergency and should be followed up as soon as possible.  Feel free to call the clinic you have any questions or concerns. The clinic phone number is [phone].

## 2014-08-03 ENCOUNTER — Other Ambulatory Visit: Payer: Self-pay | Admitting: Internal Medicine

## 2014-08-03 ENCOUNTER — Ambulatory Visit (HOSPITAL_BASED_OUTPATIENT_CLINIC_OR_DEPARTMENT_OTHER): Payer: Medicare Other

## 2014-08-03 VITALS — BP 145/90 | HR 108 | Temp 97.5°F

## 2014-08-03 DIAGNOSIS — Z5189 Encounter for other specified aftercare: Secondary | ICD-10-CM

## 2014-08-03 DIAGNOSIS — C7A1 Malignant poorly differentiated neuroendocrine tumors: Secondary | ICD-10-CM

## 2014-08-03 MED ORDER — PEGFILGRASTIM INJECTION 6 MG/0.6ML
6.0000 mg | Freq: Once | SUBCUTANEOUS | Status: AC
  Administered 2014-08-03: 6 mg via SUBCUTANEOUS

## 2014-08-03 NOTE — Patient Instructions (Signed)
Your CT scan revealed further improvement in your disease Continue weekly labs as scheduled Followup in 3 weeks prior to your next scheduled cycle of chemotherapy

## 2014-08-07 ENCOUNTER — Other Ambulatory Visit (HOSPITAL_BASED_OUTPATIENT_CLINIC_OR_DEPARTMENT_OTHER): Payer: Medicare Other

## 2014-08-07 DIAGNOSIS — J9859 Other diseases of mediastinum, not elsewhere classified: Secondary | ICD-10-CM

## 2014-08-07 DIAGNOSIS — C7A1 Malignant poorly differentiated neuroendocrine tumors: Secondary | ICD-10-CM

## 2014-08-07 LAB — CBC WITH DIFFERENTIAL/PLATELET
BASO%: 1.4 % (ref 0.0–2.0)
Basophils Absolute: 0.1 10*3/uL (ref 0.0–0.1)
EOS%: 0 % (ref 0.0–7.0)
Eosinophils Absolute: 0 10*3/uL (ref 0.0–0.5)
HCT: 21.3 % — ABNORMAL LOW (ref 34.8–46.6)
HGB: 7.4 g/dL — ABNORMAL LOW (ref 11.6–15.9)
LYMPH%: 5.8 % — AB (ref 14.0–49.7)
MCH: 32.6 pg (ref 25.1–34.0)
MCHC: 34.7 g/dL (ref 31.5–36.0)
MCV: 93.8 fL (ref 79.5–101.0)
MONO#: 0 10*3/uL — ABNORMAL LOW (ref 0.1–0.9)
MONO%: 0.4 % (ref 0.0–14.0)
NEUT#: 8.3 10*3/uL — ABNORMAL HIGH (ref 1.5–6.5)
NEUT%: 92.4 % — ABNORMAL HIGH (ref 38.4–76.8)
NRBC: 0 % (ref 0–0)
PLATELETS: 177 10*3/uL (ref 145–400)
RBC: 2.27 10*6/uL — ABNORMAL LOW (ref 3.70–5.45)
RDW: 18.2 % — AB (ref 11.2–14.5)
WBC: 9 10*3/uL (ref 3.9–10.3)
lymph#: 0.5 10*3/uL — ABNORMAL LOW (ref 0.9–3.3)

## 2014-08-07 LAB — COMPREHENSIVE METABOLIC PANEL (CC13)
ALBUMIN: 3.8 g/dL (ref 3.5–5.0)
ALT: 8 U/L (ref 0–55)
AST: 18 U/L (ref 5–34)
Alkaline Phosphatase: 111 U/L (ref 40–150)
Anion Gap: 9 mEq/L (ref 3–11)
BUN: 16.7 mg/dL (ref 7.0–26.0)
CO2: 24 mEq/L (ref 22–29)
Calcium: 8.9 mg/dL (ref 8.4–10.4)
Chloride: 97 mEq/L — ABNORMAL LOW (ref 98–109)
Creatinine: 0.7 mg/dL (ref 0.6–1.1)
Glucose: 95 mg/dl (ref 70–140)
Potassium: 4.4 mEq/L (ref 3.5–5.1)
Sodium: 129 mEq/L — ABNORMAL LOW (ref 136–145)
Total Bilirubin: 0.76 mg/dL (ref 0.20–1.20)
Total Protein: 6.8 g/dL (ref 6.4–8.3)

## 2014-08-07 LAB — TECHNOLOGIST REVIEW

## 2014-08-07 NOTE — Progress Notes (Signed)
Quick Note:  Call patient with the result and arrange for 2 units of PRBCs transfusion. ______

## 2014-08-08 ENCOUNTER — Telehealth: Payer: Self-pay | Admitting: Internal Medicine

## 2014-08-08 ENCOUNTER — Other Ambulatory Visit: Payer: Self-pay | Admitting: Medical Oncology

## 2014-08-08 ENCOUNTER — Telehealth: Payer: Self-pay | Admitting: Medical Oncology

## 2014-08-08 DIAGNOSIS — D649 Anemia, unspecified: Secondary | ICD-10-CM

## 2014-08-08 NOTE — Telephone Encounter (Signed)
per 08/08/14 pof sent @ 6:14pm. pt needs blood 8/14. urgent message sent to chemo scheduler and inf manager re request and contacting pt. no other orders per 8/13 pof.

## 2014-08-08 NOTE — Telephone Encounter (Signed)
NOtifed to come in tomorrow for blood -appt will be called to her in am.

## 2014-08-09 ENCOUNTER — Ambulatory Visit (HOSPITAL_BASED_OUTPATIENT_CLINIC_OR_DEPARTMENT_OTHER): Payer: Medicare Other

## 2014-08-09 ENCOUNTER — Encounter: Payer: Self-pay | Admitting: *Deleted

## 2014-08-09 ENCOUNTER — Ambulatory Visit (HOSPITAL_COMMUNITY)
Admission: RE | Admit: 2014-08-09 | Discharge: 2014-08-09 | Disposition: A | Discharge status: Home / Self Care | Payer: Medicare Other | Source: Ambulatory Visit | Attending: Internal Medicine | Admitting: Internal Medicine

## 2014-08-09 ENCOUNTER — Ambulatory Visit: Payer: Medicare Other

## 2014-08-09 VITALS — BP 138/75 | HR 89 | Temp 97.9°F | Resp 18

## 2014-08-09 DIAGNOSIS — C342 Malignant neoplasm of middle lobe, bronchus or lung: Secondary | ICD-10-CM | POA: Insufficient documentation

## 2014-08-09 DIAGNOSIS — D6481 Anemia due to antineoplastic chemotherapy: Secondary | ICD-10-CM

## 2014-08-09 DIAGNOSIS — D649 Anemia, unspecified: Secondary | ICD-10-CM | POA: Diagnosis not present

## 2014-08-09 DIAGNOSIS — T451X5A Adverse effect of antineoplastic and immunosuppressive drugs, initial encounter: Secondary | ICD-10-CM

## 2014-08-09 LAB — PREPARE RBC (CROSSMATCH)

## 2014-08-09 MED ORDER — DIPHENHYDRAMINE HCL 25 MG PO CAPS
25.0000 mg | ORAL_CAPSULE | Freq: Once | ORAL | Status: AC
  Administered 2014-08-09: 25 mg via ORAL

## 2014-08-09 MED ORDER — HEPARIN SOD (PORK) LOCK FLUSH 100 UNIT/ML IV SOLN
500.0000 [IU] | Freq: Every day | INTRAVENOUS | Status: AC | PRN
  Administered 2014-08-09: 500 [IU]
  Filled 2014-08-09: qty 5

## 2014-08-09 MED ORDER — ACETAMINOPHEN 325 MG PO TABS
650.0000 mg | ORAL_TABLET | Freq: Once | ORAL | Status: AC
  Administered 2014-08-09: 650 mg via ORAL

## 2014-08-09 MED ORDER — DIPHENHYDRAMINE HCL 25 MG PO CAPS
ORAL_CAPSULE | ORAL | Status: AC
  Filled 2014-08-09: qty 1

## 2014-08-09 MED ORDER — SODIUM CHLORIDE 0.9 % IJ SOLN
10.0000 mL | INTRAMUSCULAR | Status: AC | PRN
  Administered 2014-08-09: 10 mL
  Filled 2014-08-09: qty 10

## 2014-08-09 MED ORDER — SODIUM CHLORIDE 0.9 % IV SOLN
250.0000 mL | Freq: Once | INTRAVENOUS | Status: AC
  Administered 2014-08-09: 250 mL via INTRAVENOUS

## 2014-08-09 MED ORDER — ACETAMINOPHEN 325 MG PO TABS
ORAL_TABLET | ORAL | Status: AC
  Filled 2014-08-09: qty 2

## 2014-08-09 NOTE — Patient Instructions (Signed)

## 2014-08-10 LAB — TYPE AND SCREEN
ABO/RH(D): O POS
Antibody Screen: NEGATIVE
UNIT DIVISION: 0
Unit division: 0

## 2014-08-14 ENCOUNTER — Other Ambulatory Visit (HOSPITAL_BASED_OUTPATIENT_CLINIC_OR_DEPARTMENT_OTHER): Payer: Medicare Other

## 2014-08-14 DIAGNOSIS — J9859 Other diseases of mediastinum, not elsewhere classified: Secondary | ICD-10-CM

## 2014-08-14 DIAGNOSIS — D6481 Anemia due to antineoplastic chemotherapy: Secondary | ICD-10-CM

## 2014-08-14 DIAGNOSIS — T451X5A Adverse effect of antineoplastic and immunosuppressive drugs, initial encounter: Secondary | ICD-10-CM

## 2014-08-14 LAB — COMPREHENSIVE METABOLIC PANEL (CC13)
ALBUMIN: 4 g/dL (ref 3.5–5.0)
ALK PHOS: 108 U/L (ref 40–150)
ALT: 10 U/L (ref 0–55)
AST: 21 U/L (ref 5–34)
Anion Gap: 10 mEq/L (ref 3–11)
BUN: 7.7 mg/dL (ref 7.0–26.0)
CHLORIDE: 102 meq/L (ref 98–109)
CO2: 25 mEq/L (ref 22–29)
Calcium: 8.7 mg/dL (ref 8.4–10.4)
Creatinine: 0.7 mg/dL (ref 0.6–1.1)
GLUCOSE: 91 mg/dL (ref 70–140)
POTASSIUM: 3.5 meq/L (ref 3.5–5.1)
Sodium: 136 mEq/L (ref 136–145)
TOTAL PROTEIN: 7.2 g/dL (ref 6.4–8.3)
Total Bilirubin: 0.36 mg/dL (ref 0.20–1.20)

## 2014-08-14 LAB — CBC WITH DIFFERENTIAL/PLATELET
BASO%: 0.2 % (ref 0.0–2.0)
Basophils Absolute: 0 10*3/uL (ref 0.0–0.1)
EOS%: 0.2 % (ref 0.0–7.0)
Eosinophils Absolute: 0 10*3/uL (ref 0.0–0.5)
HEMATOCRIT: 30.7 % — AB (ref 34.8–46.6)
HGB: 10.5 g/dL — ABNORMAL LOW (ref 11.6–15.9)
LYMPH#: 0.8 10*3/uL — AB (ref 0.9–3.3)
LYMPH%: 8 % — ABNORMAL LOW (ref 14.0–49.7)
MCH: 32.2 pg (ref 25.1–34.0)
MCHC: 34.2 g/dL (ref 31.5–36.0)
MCV: 94.1 fL (ref 79.5–101.0)
MONO#: 0.8 10*3/uL (ref 0.1–0.9)
MONO%: 8.2 % (ref 0.0–14.0)
NEUT#: 8.4 10*3/uL — ABNORMAL HIGH (ref 1.5–6.5)
NEUT%: 83.4 % — ABNORMAL HIGH (ref 38.4–76.8)
Platelets: 45 10*3/uL — ABNORMAL LOW (ref 145–400)
RBC: 3.27 10*6/uL — AB (ref 3.70–5.45)
RDW: 16.2 % — ABNORMAL HIGH (ref 11.2–14.5)
WBC: 10.1 10*3/uL (ref 3.9–10.3)

## 2014-08-21 ENCOUNTER — Other Ambulatory Visit: Payer: Medicare Other

## 2014-08-21 ENCOUNTER — Ambulatory Visit: Payer: Medicare Other

## 2014-08-21 ENCOUNTER — Encounter: Payer: Self-pay | Admitting: Internal Medicine

## 2014-08-21 ENCOUNTER — Ambulatory Visit (HOSPITAL_BASED_OUTPATIENT_CLINIC_OR_DEPARTMENT_OTHER): Payer: Medicare Other

## 2014-08-21 ENCOUNTER — Ambulatory Visit (HOSPITAL_BASED_OUTPATIENT_CLINIC_OR_DEPARTMENT_OTHER): Payer: Medicare Other | Admitting: Internal Medicine

## 2014-08-21 ENCOUNTER — Telehealth: Payer: Self-pay | Admitting: Internal Medicine

## 2014-08-21 ENCOUNTER — Other Ambulatory Visit (HOSPITAL_BASED_OUTPATIENT_CLINIC_OR_DEPARTMENT_OTHER): Payer: Medicare Other

## 2014-08-21 VITALS — BP 128/84 | HR 115 | Temp 97.5°F | Resp 18 | Ht 61.0 in | Wt 138.8 lb

## 2014-08-21 DIAGNOSIS — Z5111 Encounter for antineoplastic chemotherapy: Secondary | ICD-10-CM

## 2014-08-21 DIAGNOSIS — C3490 Malignant neoplasm of unspecified part of unspecified bronchus or lung: Secondary | ICD-10-CM

## 2014-08-21 DIAGNOSIS — J9859 Other diseases of mediastinum, not elsewhere classified: Secondary | ICD-10-CM

## 2014-08-21 DIAGNOSIS — C3410 Malignant neoplasm of upper lobe, unspecified bronchus or lung: Secondary | ICD-10-CM

## 2014-08-21 LAB — CBC WITH DIFFERENTIAL/PLATELET
BASO%: 0.4 % (ref 0.0–2.0)
Basophils Absolute: 0 10*3/uL (ref 0.0–0.1)
EOS%: 0.4 % (ref 0.0–7.0)
Eosinophils Absolute: 0 10*3/uL (ref 0.0–0.5)
HCT: 31.4 % — ABNORMAL LOW (ref 34.8–46.6)
HGB: 10.7 g/dL — ABNORMAL LOW (ref 11.6–15.9)
LYMPH%: 8.1 % — AB (ref 14.0–49.7)
MCH: 32.6 pg (ref 25.1–34.0)
MCHC: 34 g/dL (ref 31.5–36.0)
MCV: 95.9 fL (ref 79.5–101.0)
MONO#: 1.2 10*3/uL — AB (ref 0.1–0.9)
MONO%: 15.1 % — ABNORMAL HIGH (ref 0.0–14.0)
NEUT%: 76 % (ref 38.4–76.8)
NEUTROS ABS: 6.1 10*3/uL (ref 1.5–6.5)
PLATELETS: 194 10*3/uL (ref 145–400)
RBC: 3.28 10*6/uL — AB (ref 3.70–5.45)
RDW: 16.6 % — AB (ref 11.2–14.5)
WBC: 8.1 10*3/uL (ref 3.9–10.3)
lymph#: 0.7 10*3/uL — ABNORMAL LOW (ref 0.9–3.3)

## 2014-08-21 LAB — COMPREHENSIVE METABOLIC PANEL (CC13)
ALBUMIN: 4.1 g/dL (ref 3.5–5.0)
ALK PHOS: 105 U/L (ref 40–150)
ALT: 8 U/L (ref 0–55)
AST: 18 U/L (ref 5–34)
Anion Gap: 10 mEq/L (ref 3–11)
BUN: 5 mg/dL — ABNORMAL LOW (ref 7.0–26.0)
CO2: 25 meq/L (ref 22–29)
Calcium: 9 mg/dL (ref 8.4–10.4)
Chloride: 103 mEq/L (ref 98–109)
Creatinine: 0.8 mg/dL (ref 0.6–1.1)
Glucose: 105 mg/dl (ref 70–140)
POTASSIUM: 4 meq/L (ref 3.5–5.1)
SODIUM: 137 meq/L (ref 136–145)
TOTAL PROTEIN: 7.5 g/dL (ref 6.4–8.3)
Total Bilirubin: 0.39 mg/dL (ref 0.20–1.20)

## 2014-08-21 MED ORDER — SODIUM CHLORIDE 0.9 % IV SOLN
359.0000 mg | Freq: Once | INTRAVENOUS | Status: AC
  Administered 2014-08-21: 360 mg via INTRAVENOUS
  Filled 2014-08-21: qty 36

## 2014-08-21 MED ORDER — SODIUM CHLORIDE 0.9 % IJ SOLN
10.0000 mL | INTRAMUSCULAR | Status: DC | PRN
  Administered 2014-08-21: 10 mL
  Filled 2014-08-21: qty 10

## 2014-08-21 MED ORDER — DEXAMETHASONE SODIUM PHOSPHATE 20 MG/5ML IJ SOLN
INTRAMUSCULAR | Status: AC
  Filled 2014-08-21: qty 5

## 2014-08-21 MED ORDER — ONDANSETRON 16 MG/50ML IVPB (CHCC)
INTRAVENOUS | Status: AC
  Filled 2014-08-21: qty 16

## 2014-08-21 MED ORDER — SODIUM CHLORIDE 0.9 % IV SOLN
Freq: Once | INTRAVENOUS | Status: AC
  Administered 2014-08-21: 11:00:00 via INTRAVENOUS

## 2014-08-21 MED ORDER — ONDANSETRON 16 MG/50ML IVPB (CHCC)
16.0000 mg | Freq: Once | INTRAVENOUS | Status: AC
  Administered 2014-08-21: 16 mg via INTRAVENOUS

## 2014-08-21 MED ORDER — SODIUM CHLORIDE 0.9 % IV SOLN
120.0000 mg/m2 | Freq: Once | INTRAVENOUS | Status: AC
  Administered 2014-08-21: 200 mg via INTRAVENOUS
  Filled 2014-08-21: qty 10

## 2014-08-21 MED ORDER — DEXAMETHASONE SODIUM PHOSPHATE 20 MG/5ML IJ SOLN
20.0000 mg | Freq: Once | INTRAMUSCULAR | Status: AC
  Administered 2014-08-21: 20 mg via INTRAVENOUS

## 2014-08-21 MED ORDER — HEPARIN SOD (PORK) LOCK FLUSH 100 UNIT/ML IV SOLN
500.0000 [IU] | Freq: Once | INTRAVENOUS | Status: AC | PRN
  Administered 2014-08-21: 500 [IU]
  Filled 2014-08-21: qty 5

## 2014-08-21 NOTE — Telephone Encounter (Signed)
gv and pritned appt sched and avs for pt fro Aug and SEpt....pt wants water based

## 2014-08-21 NOTE — Progress Notes (Signed)
[facility] [phone]   [phone]  OFFICE PROGRESS NOTE  Frances Man, MD [place] [address]  DIAGNOSIS: Extensive stage small cell lung cancer presenting with large mediastinal mass with bilateral mediastinal lymphadenopathy as well as bilateral pulmonary nodules and supraclavicular lymph nodes in addition to questionable malignant pericardial effusion diagnosed in April of 2015.  PRIOR THERAPY:   CURRENT THERAPY: Systemic chemotherapy with carboplatin for AUC of 5 on day 1 and etoposide 120 mg/M2 on days 1, 2 and 3 with Neulasta support on day 4. Status post 5 cycles.  INTERVAL HISTORY: Frances Gonzalez 79 y.o. female returns to the clinic today for followup visit accompanied by her sister. The patient is feeling fine today with no specific complaints. She has improvement in her condition after receiving 2 units of PRBCs transfusion. She denied having any significant fever or chills, no nausea or vomiting. She denied having any significant chest pain , no cough or hemoptysis. She is here today to start cycle #6 of her chemotherapy.   MEDICAL HISTORY: Past Medical History  Diagnosis Date  . Hypertension   . Lung cancer     dx'd 2010/2015    ALLERGIES:  has No Known Allergies.  MEDICATIONS:  Current Outpatient Prescriptions  Medication Sig Dispense Refill  . dextromethorphan (DELSYM) 30 MG/5ML liquid Take 60 mg by mouth 2 (two) times daily.       Marland Kitchen lidocaine-prilocaine (EMLA) cream Apply 1 application topically as needed.  30 g  0  . loratadine (CLARITIN) 10 MG tablet Take 10 mg by mouth daily as needed for allergies.      . Multiple Vitamin (MULTIVITAMIN) tablet Take 1 tablet by mouth daily.      . Multiple Vitamins-Minerals (MULTIVITAMINS THER. W/MINERALS) TABS Take 1 tablet by mouth daily.       . prochlorperazine (COMPAZINE) 10 MG tablet Take 1 tablet (10 mg total) by mouth every 6 (six) hours as needed  for nausea or vomiting.  60 tablet  0  . simvastatin (ZOCOR) 20 MG tablet Take 10 mg by mouth every evening.      Marland Kitchen tetrahydrozoline 0.05 % ophthalmic solution Place 1 drop into both eyes daily as needed (red eyes).      . irbesartan (AVAPRO) 150 MG tablet 150 mg. Half tablet daily       No current facility-administered medications for this visit.    SURGICAL HISTORY:  Past Surgical History  Procedure Laterality Date  . Lung cancer surgery  02/24/04    [facility] Dr Arlyce Dice    REVIEW OF SYSTEMS:  Constitutional: positive for fatigue Eyes: negative Ears, nose, mouth, throat, and face: negative Respiratory: positive for dyspnea on exertion Cardiovascular: negative Gastrointestinal: negative Genitourinary:negative Integument/breast: negative Hematologic/lymphatic: negative Musculoskeletal:negative Neurological: negative Behavioral/Psych: negative Endocrine: negative Allergic/Immunologic: negative   PHYSICAL EXAMINATION: General appearance: alert, cooperative and no distress Head: Normocephalic, without obvious abnormality, atraumatic Neck: no adenopathy, no JVD, supple, symmetrical, trachea midline and thyroid not enlarged, symmetric, no tenderness/mass/nodules Lymph nodes: Cervical, supraclavicular, and axillary nodes normal. Resp: clear to auscultation bilaterally Back: symmetric, no curvature. ROM normal. No CVA tenderness. Cardio: regular rate and rhythm, S1, S2 normal, no murmur, click, rub or gallop GI: soft, non-tender; bowel sounds normal; no masses,  no organomegaly Extremities: extremities normal, atraumatic, no cyanosis or edema Neurologic: Alert and oriented X 3, normal strength and tone. Normal symmetric reflexes. Normal coordination and gait  ECOG PERFORMANCE STATUS: 1 - Symptomatic  but completely ambulatory  Blood pressure 128/84, pulse 115, temperature 97.5 F (36.4 C), temperature source Oral, resp. rate 18, height 5\' 1"  (1.549 m), weight 138 lb 12.8 oz (62.959  kg).  LABORATORY DATA: Lab Results  Component Value Date   WBC 8.1 08/21/2014   HGB 10.7* 08/21/2014   HCT 31.4* 08/21/2014   MCV 95.9 08/21/2014   PLT 194 08/21/2014      Chemistry      Component Value Date/Time   NA 137 08/21/2014 0938   NA 132* 04/05/2014 1705   K 4.0 08/21/2014 0938   K 4.4 04/05/2014 1705   CL 98 04/05/2014 1705   CO2 25 08/21/2014 0938   CO2 26 04/05/2014 1705   BUN 5.0* 08/21/2014 0938   BUN 11 04/05/2014 1705   CREATININE 0.8 08/21/2014 0938   CREATININE 0.6 04/05/2014 1705      Component Value Date/Time   CALCIUM 9.0 08/21/2014 0938   CALCIUM 9.6 04/05/2014 1705   ALKPHOS 105 08/21/2014 0938   ALKPHOS 61 01/16/2013 1411   AST 18 08/21/2014 0938   AST 23 01/16/2013 1411   ALT 8 08/21/2014 0938   ALT 17 01/16/2013 1411   BILITOT 0.39 08/21/2014 0938   BILITOT 0.6 01/16/2013 1411       RADIOGRAPHIC STUDIES:  ASSESSMENT AND PLAN: This is a very pleasant 79 years old white female recently diagnosed with extensive stage small cell lung cancer currently undergoing systemic chemotherapy with carboplatin and etoposide status post 5 cycles. The patient tolerated the last cycle of her treatment well except for increasing fatigue most likely secondary to chemotherapy-induced anemia, which significantly improved after PRBCs transfusion. I recommended for the patient to proceed with cycle #5 today. She would come back for followup visit in 3 weeks after repeating CT scan of the chest, abdomen and pelvis for restaging of her disease.  She was advised to call immediately if she has any concerning symptoms in the interval.  The patient voices understanding of current disease status and treatment options and is in agreement with the current care plan.  All questions were answered. The patient knows to call the clinic with any problems, questions or concerns. We can certainly see the patient much sooner if necessary.  Disclaimer: This note was dictated with voice recognition  software. Similar sounding words can inadvertently be transcribed and may not be corrected upon review.

## 2014-08-21 NOTE — Patient Instructions (Signed)
[facility] Discharge Instructions for Patients Receiving Chemotherapy  Today you received the following chemotherapy agents Etoposide and Carboplatin.  To help prevent nausea and vomiting after your treatment, we encourage you to take your nausea medication.   If you develop nausea and vomiting that is not controlled by your nausea medication, call the clinic.   BELOW ARE SYMPTOMS THAT SHOULD BE REPORTED IMMEDIATELY:  *FEVER GREATER THAN 100.5 F  *CHILLS WITH OR WITHOUT FEVER  NAUSEA AND VOMITING THAT IS NOT CONTROLLED WITH YOUR NAUSEA MEDICATION  *UNUSUAL SHORTNESS OF BREATH  *UNUSUAL BRUISING OR BLEEDING  TENDERNESS IN MOUTH AND THROAT WITH OR WITHOUT PRESENCE OF ULCERS  *URINARY PROBLEMS  *BOWEL PROBLEMS  UNUSUAL RASH Items with * indicate a potential emergency and should be followed up as soon as possible.  Feel free to call the clinic you have any questions or concerns. The clinic phone number is [phone].

## 2014-08-22 ENCOUNTER — Ambulatory Visit: Payer: Medicare Other

## 2014-08-22 ENCOUNTER — Ambulatory Visit (HOSPITAL_BASED_OUTPATIENT_CLINIC_OR_DEPARTMENT_OTHER): Payer: Medicare Other

## 2014-08-22 VITALS — BP 146/81 | HR 109 | Temp 97.7°F | Resp 18

## 2014-08-22 DIAGNOSIS — C3490 Malignant neoplasm of unspecified part of unspecified bronchus or lung: Secondary | ICD-10-CM

## 2014-08-22 DIAGNOSIS — C3410 Malignant neoplasm of upper lobe, unspecified bronchus or lung: Secondary | ICD-10-CM

## 2014-08-22 DIAGNOSIS — Z5111 Encounter for antineoplastic chemotherapy: Secondary | ICD-10-CM

## 2014-08-22 MED ORDER — ONDANSETRON 8 MG/50ML IVPB (CHCC)
8.0000 mg | Freq: Once | INTRAVENOUS | Status: AC
  Administered 2014-08-22: 8 mg via INTRAVENOUS

## 2014-08-22 MED ORDER — DEXAMETHASONE SODIUM PHOSPHATE 10 MG/ML IJ SOLN
INTRAMUSCULAR | Status: AC
  Filled 2014-08-22: qty 1

## 2014-08-22 MED ORDER — ONDANSETRON 8 MG/NS 50 ML IVPB
INTRAVENOUS | Status: AC
  Filled 2014-08-22: qty 8

## 2014-08-22 MED ORDER — SODIUM CHLORIDE 0.9 % IV SOLN
Freq: Once | INTRAVENOUS | Status: AC
Start: 2014-08-22 — End: 2014-08-22
  Administered 2014-08-22: 11:00:00 via INTRAVENOUS

## 2014-08-22 MED ORDER — SODIUM CHLORIDE 0.9 % IV SOLN
120.0000 mg/m2 | Freq: Once | INTRAVENOUS | Status: AC
  Administered 2014-08-22: 200 mg via INTRAVENOUS
  Filled 2014-08-22: qty 10

## 2014-08-22 MED ORDER — HEPARIN SOD (PORK) LOCK FLUSH 100 UNIT/ML IV SOLN
500.0000 [IU] | Freq: Once | INTRAVENOUS | Status: AC | PRN
  Administered 2014-08-22: 500 [IU]
  Filled 2014-08-22: qty 5

## 2014-08-22 MED ORDER — SODIUM CHLORIDE 0.9 % IJ SOLN
10.0000 mL | INTRAMUSCULAR | Status: DC | PRN
  Administered 2014-08-22: 10 mL
  Filled 2014-08-22: qty 10

## 2014-08-22 MED ORDER — DEXAMETHASONE SODIUM PHOSPHATE 10 MG/ML IJ SOLN
10.0000 mg | Freq: Once | INTRAMUSCULAR | Status: AC
  Administered 2014-08-22: 10 mg via INTRAVENOUS

## 2014-08-22 NOTE — Patient Instructions (Signed)
[facility] Discharge Instructions for Patients Receiving Chemotherapy  Today you received the following chemotherapy agents: Etoposide.  To help prevent nausea and vomiting after your treatment, we encourage you to take your nausea medication as prescribed.   If you develop nausea and vomiting that is not controlled by your nausea medication, call the clinic.   BELOW ARE SYMPTOMS THAT SHOULD BE REPORTED IMMEDIATELY:  *FEVER GREATER THAN 100.5 F  *CHILLS WITH OR WITHOUT FEVER  NAUSEA AND VOMITING THAT IS NOT CONTROLLED WITH YOUR NAUSEA MEDICATION  *UNUSUAL SHORTNESS OF BREATH  *UNUSUAL BRUISING OR BLEEDING  TENDERNESS IN MOUTH AND THROAT WITH OR WITHOUT PRESENCE OF ULCERS  *URINARY PROBLEMS  *BOWEL PROBLEMS  UNUSUAL RASH Items with * indicate a potential emergency and should be followed up as soon as possible.  Feel free to call the clinic you have any questions or concerns. The clinic phone number is [phone].

## 2014-08-23 ENCOUNTER — Ambulatory Visit: Payer: Medicare Other

## 2014-08-23 ENCOUNTER — Ambulatory Visit (HOSPITAL_BASED_OUTPATIENT_CLINIC_OR_DEPARTMENT_OTHER): Payer: Medicare Other

## 2014-08-23 VITALS — BP 141/92 | HR 108 | Temp 97.3°F

## 2014-08-23 DIAGNOSIS — C7A1 Malignant poorly differentiated neuroendocrine tumors: Secondary | ICD-10-CM

## 2014-08-23 DIAGNOSIS — C3490 Malignant neoplasm of unspecified part of unspecified bronchus or lung: Secondary | ICD-10-CM

## 2014-08-23 DIAGNOSIS — Z5111 Encounter for antineoplastic chemotherapy: Secondary | ICD-10-CM

## 2014-08-23 MED ORDER — ONDANSETRON 8 MG/NS 50 ML IVPB
INTRAVENOUS | Status: AC
  Filled 2014-08-23: qty 8

## 2014-08-23 MED ORDER — DEXAMETHASONE SODIUM PHOSPHATE 10 MG/ML IJ SOLN
10.0000 mg | Freq: Once | INTRAMUSCULAR | Status: AC
  Administered 2014-08-23: 10 mg via INTRAVENOUS

## 2014-08-23 MED ORDER — ONDANSETRON 8 MG/50ML IVPB (CHCC)
8.0000 mg | Freq: Once | INTRAVENOUS | Status: AC
Start: 2014-08-23 — End: 2014-08-23
  Administered 2014-08-23: 8 mg via INTRAVENOUS

## 2014-08-23 MED ORDER — HEPARIN SOD (PORK) LOCK FLUSH 100 UNIT/ML IV SOLN
500.0000 [IU] | Freq: Once | INTRAVENOUS | Status: AC | PRN
  Administered 2014-08-23: 500 [IU]
  Filled 2014-08-23: qty 5

## 2014-08-23 MED ORDER — DEXAMETHASONE SODIUM PHOSPHATE 10 MG/ML IJ SOLN
INTRAMUSCULAR | Status: AC
  Filled 2014-08-23: qty 1

## 2014-08-23 MED ORDER — SODIUM CHLORIDE 0.9 % IV SOLN
120.0000 mg/m2 | Freq: Once | INTRAVENOUS | Status: AC
  Administered 2014-08-23: 200 mg via INTRAVENOUS
  Filled 2014-08-23: qty 10

## 2014-08-23 MED ORDER — SODIUM CHLORIDE 0.9 % IJ SOLN
10.0000 mL | INTRAMUSCULAR | Status: DC | PRN
  Administered 2014-08-23: 10 mL
  Filled 2014-08-23: qty 10

## 2014-08-23 MED ORDER — SODIUM CHLORIDE 0.9 % IV SOLN
Freq: Once | INTRAVENOUS | Status: AC
  Administered 2014-08-23: 11:00:00 via INTRAVENOUS

## 2014-08-23 NOTE — Patient Instructions (Signed)
[facility] Discharge Instructions for Patients Receiving Chemotherapy  Today you received the following chemotherapy agents etoposide.    To help prevent nausea and vomiting after your treatment, we encourage you to take your nausea medication as directed.    If you develop nausea and vomiting that is not controlled by your nausea medication, call the clinic.   BELOW ARE SYMPTOMS THAT SHOULD BE REPORTED IMMEDIATELY:  *FEVER GREATER THAN 100.5 F  *CHILLS WITH OR WITHOUT FEVER  NAUSEA AND VOMITING THAT IS NOT CONTROLLED WITH YOUR NAUSEA MEDICATION  *UNUSUAL SHORTNESS OF BREATH  *UNUSUAL BRUISING OR BLEEDING  TENDERNESS IN MOUTH AND THROAT WITH OR WITHOUT PRESENCE OF ULCERS  *URINARY PROBLEMS  *BOWEL PROBLEMS  UNUSUAL RASH Items with * indicate a potential emergency and should be followed up as soon as possible.  Feel free to call the clinic you have any questions or concerns. The clinic phone number is [phone].

## 2014-08-24 ENCOUNTER — Ambulatory Visit (HOSPITAL_BASED_OUTPATIENT_CLINIC_OR_DEPARTMENT_OTHER): Payer: Medicare Other

## 2014-08-24 VITALS — BP 135/82 | HR 115 | Temp 97.9°F | Resp 20

## 2014-08-24 DIAGNOSIS — Z5189 Encounter for other specified aftercare: Secondary | ICD-10-CM

## 2014-08-24 DIAGNOSIS — C3490 Malignant neoplasm of unspecified part of unspecified bronchus or lung: Secondary | ICD-10-CM

## 2014-08-24 DIAGNOSIS — C7A1 Malignant poorly differentiated neuroendocrine tumors: Secondary | ICD-10-CM

## 2014-08-24 MED ORDER — PEGFILGRASTIM INJECTION 6 MG/0.6ML
6.0000 mg | Freq: Once | SUBCUTANEOUS | Status: AC
  Administered 2014-08-24: 6 mg via SUBCUTANEOUS

## 2014-08-24 NOTE — Patient Instructions (Signed)
Pegfilgrastim injection What is this medicine? PEGFILGRASTIM (peg fil GRA stim) is a long-acting granulocyte colony-stimulating factor that stimulates the growth of neutrophils, a type of white blood cell important in the body's fight against infection. It is used to reduce the incidence of fever and infection in patients with certain types of cancer who are receiving chemotherapy that affects the bone marrow. This medicine may be used for other purposes; ask your health care provider or pharmacist if you have questions. COMMON BRAND NAME(S): Neulasta What should I tell my health care provider before I take this medicine? They need to know if you have any of these conditions: -latex allergy -ongoing radiation therapy -sickle cell disease -skin reactions to acrylic adhesives (On-Body Injector only) -an unusual or allergic reaction to pegfilgrastim, filgrastim, other medicines, foods, dyes, or preservatives -pregnant or trying to get pregnant -breast-feeding How should I use this medicine? This medicine is for injection under the skin. If you get this medicine at home, you will be taught how to prepare and give the pre-filled syringe or how to use the On-body Injector. Refer to the patient Instructions for Use for detailed instructions. Use exactly as directed. Take your medicine at regular intervals. Do not take your medicine more often than directed. It is important that you put your used needles and syringes in a special sharps container. Do not put them in a trash can. If you do not have a sharps container, call your pharmacist or healthcare provider to get one. Talk to your pediatrician regarding the use of this medicine in children. Special care may be needed. Overdosage: If you think you have taken too much of this medicine contact a poison control center or emergency room at once. NOTE: This medicine is only for you. Do not share this medicine with others. What if I miss a dose? It is  important not to miss your dose. Call your doctor or health care professional if you miss your dose. If you miss a dose due to an On-body Injector failure or leakage, a new dose should be administered as soon as possible using a single prefilled syringe for manual use. What may interact with this medicine? Interactions have not been studied. Give your health care provider a list of all the medicines, herbs, non-prescription drugs, or dietary supplements you use. Also tell them if you smoke, drink alcohol, or use illegal drugs. Some items may interact with your medicine. This list may not describe all possible interactions. Give your health care provider a list of all the medicines, herbs, non-prescription drugs, or dietary supplements you use. Also tell them if you smoke, drink alcohol, or use illegal drugs. Some items may interact with your medicine. What should I watch for while using this medicine? You may need blood work done while you are taking this medicine. If you are going to need a MRI, CT scan, or other procedure, tell your doctor that you are using this medicine (On-Body Injector only). What side effects may I notice from receiving this medicine? Side effects that you should report to your doctor or health care professional as soon as possible: -allergic reactions like skin rash, itching or hives, swelling of the face, lips, or tongue -dizziness -fever -pain, redness, or irritation at site where injected -pinpoint red spots on the skin -shortness of breath or breathing problems -stomach or side pain, or pain at the shoulder -swelling -tiredness -trouble passing urine Side effects that usually do not require medical attention (report to your doctor   or health care professional if they continue or are bothersome): -bone pain -muscle pain This list may not describe all possible side effects. Call your doctor for medical advice about side effects. You may report side effects to FDA at  1-800-FDA-1088. Where should I keep my medicine? Keep out of the reach of children. Store pre-filled syringes in a refrigerator between 2 and 8 degrees C (36 and 46 degrees F). Do not freeze. Keep in carton to protect from light. Throw away this medicine if it is left out of the refrigerator for more than 48 hours. Throw away any unused medicine after the expiration date. NOTE: This sheet is a summary. It may not cover all possible information. If you have questions about this medicine, talk to your doctor, pharmacist, or health care provider.  2015, [employer]. (2014-03-14 16:14:05)  

## 2014-09-11 ENCOUNTER — Ambulatory Visit (HOSPITAL_COMMUNITY)
Admission: RE | Admit: 2014-09-11 | Discharge: 2014-09-11 | Disposition: A | Discharge status: Home / Self Care | Payer: Medicare Other | Source: Ambulatory Visit | Attending: Internal Medicine | Admitting: Internal Medicine

## 2014-09-11 ENCOUNTER — Other Ambulatory Visit (HOSPITAL_BASED_OUTPATIENT_CLINIC_OR_DEPARTMENT_OTHER): Payer: Medicare Other

## 2014-09-11 DIAGNOSIS — K449 Diaphragmatic hernia without obstruction or gangrene: Secondary | ICD-10-CM | POA: Insufficient documentation

## 2014-09-11 DIAGNOSIS — D3500 Benign neoplasm of unspecified adrenal gland: Secondary | ICD-10-CM | POA: Diagnosis not present

## 2014-09-11 DIAGNOSIS — C3490 Malignant neoplasm of unspecified part of unspecified bronchus or lung: Secondary | ICD-10-CM | POA: Insufficient documentation

## 2014-09-11 DIAGNOSIS — R911 Solitary pulmonary nodule: Secondary | ICD-10-CM | POA: Diagnosis not present

## 2014-09-11 DIAGNOSIS — C7A1 Malignant poorly differentiated neuroendocrine tumors: Secondary | ICD-10-CM

## 2014-09-11 LAB — COMPREHENSIVE METABOLIC PANEL (CC13)
ALK PHOS: 98 U/L (ref 40–150)
ALT: 7 U/L (ref 0–55)
AST: 20 U/L (ref 5–34)
Albumin: 4.1 g/dL (ref 3.5–5.0)
Anion Gap: 12 mEq/L — ABNORMAL HIGH (ref 3–11)
BUN: 10.5 mg/dL (ref 7.0–26.0)
CO2: 20 mEq/L — ABNORMAL LOW (ref 22–29)
Calcium: 8.8 mg/dL (ref 8.4–10.4)
Chloride: 99 mEq/L (ref 98–109)
Creatinine: 0.7 mg/dL (ref 0.6–1.1)
Glucose: 101 mg/dl (ref 70–140)
POTASSIUM: 4.3 meq/L (ref 3.5–5.1)
SODIUM: 131 meq/L — AB (ref 136–145)
TOTAL PROTEIN: 7.4 g/dL (ref 6.4–8.3)
Total Bilirubin: 0.41 mg/dL (ref 0.20–1.20)

## 2014-09-11 LAB — CBC WITH DIFFERENTIAL/PLATELET
BASO%: 0.3 % (ref 0.0–2.0)
Basophils Absolute: 0 10*3/uL (ref 0.0–0.1)
EOS%: 0.3 % (ref 0.0–7.0)
Eosinophils Absolute: 0 10*3/uL (ref 0.0–0.5)
HEMATOCRIT: 26.2 % — AB (ref 34.8–46.6)
HGB: 9 g/dL — ABNORMAL LOW (ref 11.6–15.9)
LYMPH%: 6.3 % — ABNORMAL LOW (ref 14.0–49.7)
MCH: 33.7 pg (ref 25.1–34.0)
MCHC: 34.3 g/dL (ref 31.5–36.0)
MCV: 98.1 fL (ref 79.5–101.0)
MONO#: 1.1 10*3/uL — AB (ref 0.1–0.9)
MONO%: 11.3 % (ref 0.0–14.0)
NEUT#: 7.6 10*3/uL — ABNORMAL HIGH (ref 1.5–6.5)
NEUT%: 81.8 % — AB (ref 38.4–76.8)
Platelets: 163 10*3/uL (ref 145–400)
RBC: 2.67 10*6/uL — ABNORMAL LOW (ref 3.70–5.45)
RDW: 17.6 % — ABNORMAL HIGH (ref 11.2–14.5)
WBC: 9.3 10*3/uL (ref 3.9–10.3)
lymph#: 0.6 10*3/uL — ABNORMAL LOW (ref 0.9–3.3)

## 2014-09-11 MED ORDER — IOHEXOL 300 MG/ML  SOLN: 50.0000 mL | Freq: Once | INTRAMUSCULAR | Status: AC | PRN

## 2014-09-11 MED ORDER — IOHEXOL 300 MG/ML  SOLN
100.0000 mL | Freq: Once | INTRAMUSCULAR | Status: AC | PRN
  Administered 2014-09-11: 100 mL via INTRAVENOUS

## 2014-09-12 ENCOUNTER — Telehealth: Payer: Self-pay | Admitting: Medical Oncology

## 2014-09-12 ENCOUNTER — Ambulatory Visit: Payer: Medicare Other

## 2014-09-12 ENCOUNTER — Other Ambulatory Visit: Payer: Medicare Other

## 2014-09-12 ENCOUNTER — Other Ambulatory Visit: Payer: Self-pay | Admitting: Nurse Practitioner

## 2014-09-12 ENCOUNTER — Other Ambulatory Visit: Payer: Self-pay | Admitting: Medical Oncology

## 2014-09-12 DIAGNOSIS — R197 Diarrhea, unspecified: Secondary | ICD-10-CM

## 2014-09-12 LAB — CLOSTRIDIUM DIFFICILE BY PCR: Toxigenic C. Difficile by PCR: NEGATIVE

## 2014-09-12 NOTE — Telephone Encounter (Signed)
I instructed pt to start imodium ad .Per Cyndee bacon schedule appt for lab and check tool for c diff. Pt notified. Pt does not think she needs fluid. She has been eating and drinking well. Her voice is strong .

## 2014-09-12 NOTE — Telephone Encounter (Signed)
Uncontrollable watery diarrhea for 2 weeks. Sudden onset and occurs off and on. She has considered to start wearing adult diapers.  Took kaopectate which relieved it some but then it started back. She cannot relate it to diet , meds, . She is drinking extra fluids. ( last chemo 08/24/14).

## 2014-09-18 ENCOUNTER — Telehealth: Payer: Self-pay | Admitting: Internal Medicine

## 2014-09-18 ENCOUNTER — Encounter: Payer: Self-pay | Admitting: Internal Medicine

## 2014-09-18 ENCOUNTER — Ambulatory Visit (HOSPITAL_BASED_OUTPATIENT_CLINIC_OR_DEPARTMENT_OTHER): Payer: Medicare Other | Admitting: Internal Medicine

## 2014-09-18 VITALS — BP 117/73 | HR 109 | Temp 98.3°F | Resp 18 | Ht 61.0 in | Wt 140.0 lb

## 2014-09-18 DIAGNOSIS — C3490 Malignant neoplasm of unspecified part of unspecified bronchus or lung: Secondary | ICD-10-CM

## 2014-09-18 DIAGNOSIS — D6481 Anemia due to antineoplastic chemotherapy: Secondary | ICD-10-CM

## 2014-09-18 DIAGNOSIS — C7A1 Malignant poorly differentiated neuroendocrine tumors: Secondary | ICD-10-CM

## 2014-09-18 DIAGNOSIS — T451X5A Adverse effect of antineoplastic and immunosuppressive drugs, initial encounter: Secondary | ICD-10-CM

## 2014-09-18 NOTE — Progress Notes (Signed)
[facility] [phone]   [phone]  OFFICE PROGRESS NOTE  [name], MD [place] [address]  DIAGNOSIS: Extensive stage small cell lung cancer presenting with large mediastinal mass with bilateral mediastinal lymphadenopathy as well as bilateral pulmonary nodules and supraclavicular lymph nodes in addition to questionable malignant pericardial effusion diagnosed in April of 2015.  PRIOR THERAPY: Systemic chemotherapy with carboplatin for AUC of 5 on day 1 and etoposide 120 mg/M2 on days 1, 2 and 3 with Neulasta support on day 4. Status post 6 cycles with partial response.  CURRENT THERAPY: Observation.  INTERVAL HISTORY: Frances Gonzalez 79 y.o. female returns to the clinic today for followup visit accompanied by her sister. The patient tolerated the last cycle of her systemic chemotherapy with carboplatin and etoposide fairly well except for increasing fatigue. The patient is feeling fine today with no specific complaints. She denied having any significant fever or chills, no nausea or vomiting. She denied having any significant chest pain, shortness of breath, cough or hemoptysis. She has no weight loss or night sweats. She had repeat CT scan of the chest, abdomen and pelvis performed recently and she is here for evaluation and discussion of her scan results.    MEDICAL HISTORY: Past Medical History  Diagnosis Date  . Hypertension   . Lung cancer     dx'd 2010/2015    ALLERGIES:  has No Known Allergies.  MEDICATIONS:  Current Outpatient Prescriptions  Medication Sig Dispense Refill  . dextromethorphan (DELSYM) 30 MG/5ML liquid Take 60 mg by mouth 2 (two) times daily.       . irbesartan (AVAPRO) 150 MG tablet 150 mg. Half tablet daily      . lidocaine-prilocaine (EMLA) cream Apply 1 application topically as needed.  30 g  0  . loratadine (CLARITIN) 10 MG tablet Take 10 mg by mouth daily as needed (for bone  pain r/t neulasta).       . Multiple Vitamin (MULTIVITAMIN) tablet Take 1 tablet by mouth daily.      . prochlorperazine (COMPAZINE) 10 MG tablet Take 1 tablet (10 mg total) by mouth every 6 (six) hours as needed for nausea or vomiting.  60 tablet  0  . simvastatin (ZOCOR) 20 MG tablet Take 10 mg by mouth every evening.      Marland Kitchen tetrahydrozoline 0.05 % ophthalmic solution Place 1 drop into both eyes daily as needed (red eyes).      . pantoprazole (PROTONIX) 40 MG tablet Take 40 mg by mouth daily.       No current facility-administered medications for this visit.    SURGICAL HISTORY:  Past Surgical History  Procedure Laterality Date  . Lung cancer surgery  02/24/04    [facility] Dr Arlyce Dice    REVIEW OF SYSTEMS:  Constitutional: positive for fatigue Eyes: negative Ears, nose, mouth, throat, and face: negative Respiratory: positive for dyspnea on exertion Cardiovascular: negative Gastrointestinal: negative Genitourinary:negative Integument/breast: negative Hematologic/lymphatic: negative Musculoskeletal:negative Neurological: negative Behavioral/Psych: negative Endocrine: negative Allergic/Immunologic: negative   PHYSICAL EXAMINATION: General appearance: alert, cooperative and no distress Head: Normocephalic, without obvious abnormality, atraumatic Neck: no adenopathy, no JVD, supple, symmetrical, trachea midline and thyroid not enlarged, symmetric, no tenderness/mass/nodules Lymph nodes: Cervical, supraclavicular, and axillary nodes normal. Resp: clear to auscultation bilaterally Back: symmetric, no curvature. ROM normal. No CVA tenderness. Cardio: regular rate and rhythm, S1, S2 normal, no murmur, click, rub or gallop GI: soft, non-tender; bowel sounds normal; no  masses,  no organomegaly Extremities: extremities normal, atraumatic, no cyanosis or edema Neurologic: Alert and oriented X 3, normal strength and tone. Normal symmetric reflexes. Normal coordination and gait  ECOG PERFORMANCE  STATUS: 1 - Symptomatic but completely ambulatory  Blood pressure 117/73, pulse 109, temperature 98.3 F (36.8 C), temperature source Oral, resp. rate 18, height 5\' 1"  (1.549 m), weight 140 lb (63.504 kg).  LABORATORY DATA: Lab Results  Component Value Date   WBC 9.3 09/11/2014   HGB 9.0* 09/11/2014   HCT 26.2* 09/11/2014   MCV 98.1 09/11/2014   PLT 163 09/11/2014      Chemistry      Component Value Date/Time   NA 131* 09/11/2014 1005   NA 132* 04/05/2014 1705   K 4.3 09/11/2014 1005   K 4.4 04/05/2014 1705   CL 98 04/05/2014 1705   CO2 20* 09/11/2014 1005   CO2 26 04/05/2014 1705   BUN 10.5 09/11/2014 1005   BUN 11 04/05/2014 1705   CREATININE 0.7 09/11/2014 1005   CREATININE 0.6 04/05/2014 1705      Component Value Date/Time   CALCIUM 8.8 09/11/2014 1005   CALCIUM 9.6 04/05/2014 1705   ALKPHOS 98 09/11/2014 1005   ALKPHOS 61 01/16/2013 1411   AST 20 09/11/2014 1005   AST 23 01/16/2013 1411   ALT 7 09/11/2014 1005   ALT 17 01/16/2013 1411   BILITOT 0.41 09/11/2014 1005   BILITOT 0.6 01/16/2013 1411       RADIOGRAPHIC STUDIES: Ct Chest W Contrast  09/11/2014   CLINICAL DATA:  Followup right lung small cell carcinoma. Currently undergoing chemotherapy. Restaging.  EXAM: CT CHEST, ABDOMEN, AND PELVIS WITH CONTRAST  TECHNIQUE: Multidetector CT imaging of the chest, abdomen and pelvis was performed following the standard protocol during bolus administration of intravenous contrast.  CONTRAST:  133mL OMNIPAQUE IOHEXOL 300 MG/ML  SOLN  COMPARISON:  07/24/2014  FINDINGS: CT CHEST FINDINGS  Mediastinum/Hilar Regions: Slight decrease in ill-defined soft tissue density in the subcarinal region and encasing the carina is seen compared to previous study, currently measuring 2.3 x 3.2 cm on image 29 compared with 2.5 x 3.6 cm previously. No new areas of lymphadenopathy identified.  Other Thoracic Lymphadenopathy:  None.  Lungs: 8 mm nodule in the medial left upper lobe on image 21 at the level of the aortic  arch remains stable. No new or enlarging pulmonary nodules or masses are identified. Mild to moderate emphysema again noted.  Pleura:  No evidence of effusion or mass.  Vascular/Cardiac: No thoracic aortic aneurysm or other significant abnormality identified.  Musculoskeletal:  No suspicious bone lesions identified.  Other:  Tiny hiatal hernia again demonstrated.  CT ABDOMEN AND PELVIS FINDINGS  Hepatobiliary: No masses or other significant abnormality identified.  Pancreas: No mass, inflammatory changes, or other parenchymal abnormality identified.  Spleen:  Within normal limits in size and appearance.  Adrenal Glands: 2 cm low-attenuation left adrenal mass remains stable, consistent with benign adenoma.  Kidneys/Urinary Tract: No masses identified. No evidence of hydronephrosis.  Stomach/Bowel/Peritoneum: No evidence of bowel wall thickening, mass, or obstruction.  Lymph Nodes:  No pathologically enlarged lymph nodes identified.  Reproductive:  No mass or other significant abnormality identified.  Vascular:  No evidence of abdominal aortic aneurysm.  Musculoskeletal: No suspicious bone lesions identified. Several old lower thoracic and lumbar vertebral body compression fractures again demonstrated.  Other:  None.  IMPRESSION: Continued slight decrease in ill-defined subcarinal lymphadenopathy.  Stable 8 mm pulmonary nodule in the medial left  upper lobe.  No evidence of new or progressive disease within the chest, abdomen, or pelvis.  Stable small benign left adrenal adenoma and tiny hiatal hernia.   Electronically Signed   By: Earle Gell M.D.   On: 09/11/2014 13:15   Ct Abdomen Pelvis W Contrast  09/11/2014   CLINICAL DATA:  Followup right lung small cell carcinoma. Currently undergoing chemotherapy. Restaging.  EXAM: CT CHEST, ABDOMEN, AND PELVIS WITH CONTRAST  TECHNIQUE: Multidetector CT imaging of the chest, abdomen and pelvis was performed following the standard protocol during bolus administration of  intravenous contrast.  CONTRAST:  131mL OMNIPAQUE IOHEXOL 300 MG/ML  SOLN  COMPARISON:  07/24/2014  FINDINGS: CT CHEST FINDINGS  Mediastinum/Hilar Regions: Slight decrease in ill-defined soft tissue density in the subcarinal region and encasing the carina is seen compared to previous study, currently measuring 2.3 x 3.2 cm on image 29 compared with 2.5 x 3.6 cm previously. No new areas of lymphadenopathy identified.  Other Thoracic Lymphadenopathy:  None.  Lungs: 8 mm nodule in the medial left upper lobe on image 21 at the level of the aortic arch remains stable. No new or enlarging pulmonary nodules or masses are identified. Mild to moderate emphysema again noted.  Pleura:  No evidence of effusion or mass.  Vascular/Cardiac: No thoracic aortic aneurysm or other significant abnormality identified.  Musculoskeletal:  No suspicious bone lesions identified.  Other:  Tiny hiatal hernia again demonstrated.  CT ABDOMEN AND PELVIS FINDINGS  Hepatobiliary: No masses or other significant abnormality identified.  Pancreas: No mass, inflammatory changes, or other parenchymal abnormality identified.  Spleen:  Within normal limits in size and appearance.  Adrenal Glands: 2 cm low-attenuation left adrenal mass remains stable, consistent with benign adenoma.  Kidneys/Urinary Tract: No masses identified. No evidence of hydronephrosis.  Stomach/Bowel/Peritoneum: No evidence of bowel wall thickening, mass, or obstruction.  Lymph Nodes:  No pathologically enlarged lymph nodes identified.  Reproductive:  No mass or other significant abnormality identified.  Vascular:  No evidence of abdominal aortic aneurysm.  Musculoskeletal: No suspicious bone lesions identified. Several old lower thoracic and lumbar vertebral body compression fractures again demonstrated.  Other:  None.  IMPRESSION: Continued slight decrease in ill-defined subcarinal lymphadenopathy.  Stable 8 mm pulmonary nodule in the medial left upper lobe.  No evidence of new or  progressive disease within the chest, abdomen, or pelvis.  Stable small benign left adrenal adenoma and tiny hiatal hernia.   Electronically Signed   By: Earle Gell M.D.   On: 09/11/2014 13:15   ASSESSMENT AND PLAN: This is a very pleasant 79 years old white female recently diagnosed with extensive stage small cell lung cancer currently undergoing systemic chemotherapy with carboplatin and etoposide status post 6 cycles. The patient tolerated the last cycle of her treatment well except for increasing fatigue most likely secondary to chemotherapy-induced anemia, which significantly improved after PRBCs transfusion. Her recent CT scan of the chest, abdomen and pelvis showed continuous improvement in her disease with residual disease in the mediastinum. I had a lengthy discussion with the patient and her sister today about her current disease status and treatment options. I discussed with the patient observation and close monitoring versus radiotherapy to the residual disease in the chest. She is interested in considering the radiotherapy option. I will refer her to radiation oncology for evaluation and discussion of this option. I would see her back for followup visit in 3 months after repeating CT scan of the chest, abdomen and pelvis for restaging  of her disease. She was advised to call immediately if she has any concerning symptoms in the interval.  The patient voices understanding of current disease status and treatment options and is in agreement with the current care plan.  All questions were answered. The patient knows to call the clinic with any problems, questions or concerns. We can certainly see the patient much sooner if necessary.  Disclaimer: This note was dictated with voice recognition software. Similar sounding words can inadvertently be transcribed and may not be corrected upon review.

## 2014-09-18 NOTE — Telephone Encounter (Signed)
Pt confirmed labs/ov per 09/23 POF, spk w/[facility] in Radiation, pt confirmed apt w/Dr. Tammi Klippel, gave pt AVS....KJ

## 2014-09-20 ENCOUNTER — Encounter: Payer: Self-pay | Admitting: Radiation Oncology

## 2014-09-20 NOTE — Progress Notes (Signed)
Thoracic Location of Tumor / Histology: Extensive stage small cell lung cancer presenting with large mediastinal mass with bilateral mediastinal lymphadenopathy as well as bilateral pulmonary nodules and supraclavicular lymph nodes in addition to questionable malignant pericardial effusion diagnosed in April of 2015.  Biopsies of right supraclavicular lymph node (if applicable) revealed:    Tobacco/Marijuana/Snuff/ETOH use: Former smoker quit in 2005  Past/Anticipated interventions by cardiothoracic surgery, if any: right upper lobectomy 2005  Past/Anticipated interventions by medical oncology, if any: Systemic chemotherapy with carboplatin for AUC of 5 on day 1 and etoposide 120 mg/M2 on days 1, 2 and 3 with Neulasta support on day 4. Status post 6 cycles with partial response.   Signs/Symptoms  Weight changes, if any: no  Respiratory complaints, if any: no  Hemoptysis, if any: no  Pain issues, if any:  no  SAFETY ISSUES:  Prior radiation?   Pacemaker/ICD? no   Possible current pregnancy?no  Is the patient on methotrexate? no  Current Complaints / other details:  79 year old female. Referred by Dr. Julien Nordmann to Dr. Tammi Klippel to discuss close monitoring va radiotherapy to the residual disease in the chest.

## 2014-09-22 ENCOUNTER — Other Ambulatory Visit: Payer: Self-pay | Admitting: Family Medicine

## 2014-09-22 ENCOUNTER — Encounter: Payer: Self-pay | Admitting: Radiation Oncology

## 2014-09-22 NOTE — Progress Notes (Signed)
Radiation Oncology         [phone] ________________________________  Initial outpatient Consultation  Name: Frances Gonzalez MRN: [id number]  Date: 09/23/2014  DOB: 1935/10/26    TK:ZSWF,UXNATFT ALLEN, MD  Curt Bears, MD   REFERRING PHYSICIAN: Curt Bears, MD  DIAGNOSIS: 79 yo woman with extensive stage small cell lung cancer s/p 6 cycles of chemo with residual mediastinal disease.  HISTORY OF PRESENT ILLNESS::Frances Gonzalez is a 79 y.o. female with a past medical history significant for stage IA non-small cell lung cancer, adenocarcinoma diagnosed 10 years ago status post right upper lobectomy on 02/24/2004 under the care of Dr. Arlyce Dice. In the middle of December 2014, she started having worsening dyspnea and chest congestion. In April 2015 her shortness breath and was getting worse and CT angiogram of the chest was performed on 04/09/2014. It showed a homogeneously enhancing infiltrating mediastinal mass is appreciated extending from the lower trachea through the subcarinal region, measuring 5.8 x 7.3 cm. There is mass effect and narrowing of the right and left main pulmonary arteries. The mass encompasses the distal trachea, carina and proximal right and left mainstem bronchi. A dominant right paratracheal lymph node is appreciated measuring 1.9 cm in short axis. Prevascular adenopathy is also appreciated largest lymph node measuring 7.3 cm in short axis. A small to moderate pericardial effusion is appreciated. There was also 1.7 x 1.9 CM nodule appreciated adjacent to the AP window and a stable left lower lobe pulmonary nodule. There was also small to moderate pericardial effusion and a slight increased prominence of the left adrenal nodule likely representing an adenoma.    A PET scan was performed on 04/17/2014 and it showed hypermetabolic bulky mediastinal adenopathy, right supraclavicular lymph node, left upper lobe nodular soft tissue and right middle lobe nodule. These  findings are indicative primary bronchogenic carcinoma possibly small cell type. There is also hypermetabolic bilateral pulmonary parenchymal lesions and the findings are worrisome for stage IV disease. There is a small pericardial effusion a subcutaneous malignant in addition to the left adrenal adenoma.    On 04/23/2014 the patient underwent ultrasound-guided biopsy of the right supraclavicular lymph node by interventional radiology and the final pathology (Accession: [id number]) showed high-grade poorly differentiated neuroendocrine tumor, small cell type.   MRI brain on 05/03/14 was negative for metastatic disease.  She received 6 cycles of carboplatin and etoposide chemotherapy with Dr. Julien Nordmann.  Re-staging CT Chest, Abdomen, and Pelvis shows residual mediastinal disease.    The patient has kindly been referred today to discuss possible radiation treatment options.  PREVIOUS RADIATION THERAPY: No  PAST MEDICAL HISTORY:  has a past medical history of Hypertension and Lung cancer.    PAST SURGICAL HISTORY: Past Surgical History  Procedure Laterality Date  . Lung cancer surgery  02/24/04    [facility] Dr Arlyce Dice    FAMILY HISTORY: family history includes Emphysema in her maternal grandmother. There is no history of Cancer.  SOCIAL HISTORY:  reports that she quit smoking about 10 years ago. Her smoking use included Cigarettes. She has a 50 pack-year smoking history. She has never used smokeless tobacco. She reports that she does not drink alcohol or use illicit drugs.  ALLERGIES: Review of patient's allergies indicates no known allergies.  MEDICATIONS:  Current Outpatient Prescriptions  Medication Sig Dispense Refill  . irbesartan (AVAPRO) 150 MG tablet 150 mg. Half tablet daily      . Multiple Vitamin (MULTIVITAMIN) tablet Take 1 tablet by mouth daily.      Marland Kitchen  pantoprazole (PROTONIX) 40 MG tablet Take 40 mg by mouth daily.      . simvastatin (ZOCOR) 20 MG tablet Take 10 mg by mouth every  evening.      Marland Kitchen ALPRAZolam (XANAX) 0.25 MG tablet Take 1 tablet (0.25 mg total) by mouth 2 (two) times daily as needed for anxiety (take 20 minutes before MRI).  30 tablet  0  . dextromethorphan (DELSYM) 30 MG/5ML liquid Take 60 mg by mouth 2 (two) times daily.       Marland Kitchen lidocaine-prilocaine (EMLA) cream Apply 1 application topically as needed.  30 g  0  . prochlorperazine (COMPAZINE) 10 MG tablet Take 1 tablet (10 mg total) by mouth every 6 (six) hours as needed for nausea or vomiting.  60 tablet  0  . simvastatin (ZOCOR) 20 MG tablet TAKE 1/2 TABLET BY MOUTH EVERY NIGHT AT BEDTIME  100 tablet  2   No current facility-administered medications for this encounter.    REVIEW OF SYSTEMS:  A 15 point review of systems is documented in the electronic medical record. This was obtained by the nursing staff. However, I reviewed this with the patient to discuss relevant findings and make appropriate changes.  Pertinent items are noted in HPI.   PHYSICAL EXAM:  height is 5\' 1"  (1.549 m) and weight is 140 lb 14.4 oz (63.912 kg). Her oral temperature is 97.7 F (36.5 C). Her blood pressure is 108/61 and her pulse is 96. Her respiration is 18 and oxygen saturation is 100%.   Per medical oncology on 09/18/14"  General appearance: alert, cooperative and no distress  Head: Normocephalic, without obvious abnormality, atraumatic  Neck: no adenopathy, no JVD, supple, symmetrical, trachea midline and thyroid not enlarged, symmetric, no tenderness/mass/nodules  Lymph nodes: Cervical, supraclavicular, and axillary nodes normal.  Resp: clear to auscultation bilaterally  Back: symmetric, no curvature. ROM normal. No CVA tenderness.  Cardio: regular rate and rhythm, S1, S2 normal, no murmur, click, rub or gallop  GI: soft, non-tender; bowel sounds normal; no masses, no organomegaly  Extremities: extremities normal, atraumatic, no cyanosis or edema  Neurologic: Alert and oriented X 3, normal strength and tone. Normal  symmetric reflexes. Normal coordination and gait   KPS = 90  100 - Normal; no complaints; no evidence of disease. 90   - Able to carry on normal activity; minor signs or symptoms of disease. 80   - Normal activity with effort; some signs or symptoms of disease. 52   - Cares for self; unable to carry on normal activity or to do active work. 60   - Requires occasional assistance, but is able to care for most of his personal needs. 50   - Requires considerable assistance and frequent medical care. 1   - Disabled; requires special care and assistance. 75   - Severely disabled; hospital admission is indicated although death not imminent. 33   - Very sick; hospital admission necessary; active supportive treatment necessary. 10   - Moribund; fatal processes progressing rapidly. 0     - Dead  Karnofsky DA, [place], Craver LS and Burchenal [facility] [phone]) The use of the nitrogen mustards in the palliative treatment of carcinoma: with particular reference to bronchogenic carcinoma Cancer 1 [id number]  LABORATORY DATA:  Lab Results  Component Value Date   WBC 9.3 09/11/2014   HGB 9.0* 09/11/2014   HCT 26.2* 09/11/2014   MCV 98.1 09/11/2014   PLT 163 09/11/2014   Lab Results  Component Value Date  NA 131* 09/11/2014   K 4.3 09/11/2014   CL 98 04/05/2014   CO2 20* 09/11/2014   Lab Results  Component Value Date   ALT 7 09/11/2014   AST 20 09/11/2014   ALKPHOS 98 09/11/2014   BILITOT 0.41 09/11/2014     RADIOGRAPHY: Ct Chest W Contrast  09/11/2014   CLINICAL DATA:  Followup right lung small cell carcinoma. Currently undergoing chemotherapy. Restaging.  EXAM: CT CHEST, ABDOMEN, AND PELVIS WITH CONTRAST  TECHNIQUE: Multidetector CT imaging of the chest, abdomen and pelvis was performed following the standard protocol during bolus administration of intravenous contrast.  CONTRAST:  184mL OMNIPAQUE IOHEXOL 300 MG/ML  SOLN  COMPARISON:  07/24/2014  FINDINGS: CT CHEST FINDINGS  Mediastinum/Hilar Regions:  Slight decrease in ill-defined soft tissue density in the subcarinal region and encasing the carina is seen compared to previous study, currently measuring 2.3 x 3.2 cm on image 29 compared with 2.5 x 3.6 cm previously. No new areas of lymphadenopathy identified.  Other Thoracic Lymphadenopathy:  None.  Lungs: 8 mm nodule in the medial left upper lobe on image 21 at the level of the aortic arch remains stable. No new or enlarging pulmonary nodules or masses are identified. Mild to moderate emphysema again noted.  Pleura:  No evidence of effusion or mass.  Vascular/Cardiac: No thoracic aortic aneurysm or other significant abnormality identified.  Musculoskeletal:  No suspicious bone lesions identified.  Other:  Tiny hiatal hernia again demonstrated.  CT ABDOMEN AND PELVIS FINDINGS  Hepatobiliary: No masses or other significant abnormality identified.  Pancreas: No mass, inflammatory changes, or other parenchymal abnormality identified.  Spleen:  Within normal limits in size and appearance.  Adrenal Glands: 2 cm low-attenuation left adrenal mass remains stable, consistent with benign adenoma.  Kidneys/Urinary Tract: No masses identified. No evidence of hydronephrosis.  Stomach/Bowel/Peritoneum: No evidence of bowel wall thickening, mass, or obstruction.  Lymph Nodes:  No pathologically enlarged lymph nodes identified.  Reproductive:  No mass or other significant abnormality identified.  Vascular:  No evidence of abdominal aortic aneurysm.  Musculoskeletal: No suspicious bone lesions identified. Several old lower thoracic and lumbar vertebral body compression fractures again demonstrated.  Other:  None.  IMPRESSION: Continued slight decrease in ill-defined subcarinal lymphadenopathy.  Stable 8 mm pulmonary nodule in the medial left upper lobe.  No evidence of new or progressive disease within the chest, abdomen, or pelvis.  Stable small benign left adrenal adenoma and tiny hiatal hernia.   Electronically Signed   By:  Earle Gell M.D.   On: 09/11/2014 13:15   Ct Abdomen Pelvis W Contrast  09/11/2014   CLINICAL DATA:  Followup right lung small cell carcinoma. Currently undergoing chemotherapy. Restaging.  EXAM: CT CHEST, ABDOMEN, AND PELVIS WITH CONTRAST  TECHNIQUE: Multidetector CT imaging of the chest, abdomen and pelvis was performed following the standard protocol during bolus administration of intravenous contrast.  CONTRAST:  152mL OMNIPAQUE IOHEXOL 300 MG/ML  SOLN  COMPARISON:  07/24/2014  FINDINGS: CT CHEST FINDINGS  Mediastinum/Hilar Regions: Slight decrease in ill-defined soft tissue density in the subcarinal region and encasing the carina is seen compared to previous study, currently measuring 2.3 x 3.2 cm on image 29 compared with 2.5 x 3.6 cm previously. No new areas of lymphadenopathy identified.  Other Thoracic Lymphadenopathy:  None.  Lungs: 8 mm nodule in the medial left upper lobe on image 21 at the level of the aortic arch remains stable. No new or enlarging pulmonary nodules or masses are identified. Mild to  moderate emphysema again noted.  Pleura:  No evidence of effusion or mass.  Vascular/Cardiac: No thoracic aortic aneurysm or other significant abnormality identified.  Musculoskeletal:  No suspicious bone lesions identified.  Other:  Tiny hiatal hernia again demonstrated.  CT ABDOMEN AND PELVIS FINDINGS  Hepatobiliary: No masses or other significant abnormality identified.  Pancreas: No mass, inflammatory changes, or other parenchymal abnormality identified.  Spleen:  Within normal limits in size and appearance.  Adrenal Glands: 2 cm low-attenuation left adrenal mass remains stable, consistent with benign adenoma.  Kidneys/Urinary Tract: No masses identified. No evidence of hydronephrosis.  Stomach/Bowel/Peritoneum: No evidence of bowel wall thickening, mass, or obstruction.  Lymph Nodes:  No pathologically enlarged lymph nodes identified.  Reproductive:  No mass or other significant abnormality  identified.  Vascular:  No evidence of abdominal aortic aneurysm.  Musculoskeletal: No suspicious bone lesions identified. Several old lower thoracic and lumbar vertebral body compression fractures again demonstrated.  Other:  None.  IMPRESSION: Continued slight decrease in ill-defined subcarinal lymphadenopathy.  Stable 8 mm pulmonary nodule in the medial left upper lobe.  No evidence of new or progressive disease within the chest, abdomen, or pelvis.  Stable small benign left adrenal adenoma and tiny hiatal hernia.   Electronically Signed   By: Earle Gell M.D.   On: 09/11/2014 13:15      IMPRESSION: This patient is a very nice 79 yo woman with extensive stage small cell lung cancer s/p 6 cycles of chemo with residual mediastinal disease.  She may benefit from consolidative palliative radiotherapy to the mediastinum as well as possible prophylactic cranial irradiation.  PLAN:Today, I talked to the patient and family about the findings and work-up thus far.  We discussed the natural history of extensive stage small cell lung cancer and general treatment, highlighting the role or radiotherapy in the management.  We discussed the available radiation techniques, and focused on the details of logistics and delivery.  We reviewed the anticipated acute and late sequelae associated with radiation in this setting.  The patient was encouraged to ask questions that I answered to the best of my ability.  I filled out a patient counseling form during our discussion including treatment diagrams.  We retained a copy for our records.    We discussed chest radiation as well as prophylactic cranial irradiation. At this point, the patient would like to proceed with chest radiation. She is not interested in prophylactic cranial irradiation due to the potential neurocognitive risks and would prefer to monitor closely for the development of brain metastases with brain MRI periodically.  The patient would like to proceed with  chest radiation and will be scheduled for CT simulation. Prior to simulation, we will obtain a brain MRI to serve as a baseline study and rule out recurrent brain metastases.  I spent 60 minutes minutes face to face with the patient and more than 50% of that time was spent in counseling and/or coordination of care.    ------------------------------------------------  Sheral Apley. Tammi Klippel, M.D.

## 2014-09-23 ENCOUNTER — Ambulatory Visit
Admission: RE | Admit: 2014-09-23 | Discharge: 2014-09-23 | Disposition: A | Discharge status: Home / Self Care | Payer: Medicare Other | Source: Ambulatory Visit | Attending: Radiation Oncology | Admitting: Radiation Oncology

## 2014-09-23 ENCOUNTER — Encounter: Payer: Self-pay | Admitting: Radiation Oncology

## 2014-09-23 VITALS — BP 108/61 | HR 96 | Temp 97.7°F | Resp 18 | Ht 61.0 in | Wt 140.9 lb

## 2014-09-23 DIAGNOSIS — Z9221 Personal history of antineoplastic chemotherapy: Secondary | ICD-10-CM | POA: Insufficient documentation

## 2014-09-23 DIAGNOSIS — Z510 Encounter for antineoplastic radiation therapy: Secondary | ICD-10-CM | POA: Diagnosis not present

## 2014-09-23 DIAGNOSIS — C3490 Malignant neoplasm of unspecified part of unspecified bronchus or lung: Secondary | ICD-10-CM

## 2014-09-23 DIAGNOSIS — I10 Essential (primary) hypertension: Secondary | ICD-10-CM | POA: Insufficient documentation

## 2014-09-23 DIAGNOSIS — Z902 Acquired absence of lung [part of]: Secondary | ICD-10-CM | POA: Insufficient documentation

## 2014-09-23 DIAGNOSIS — Z79899 Other long term (current) drug therapy: Secondary | ICD-10-CM | POA: Diagnosis not present

## 2014-09-23 DIAGNOSIS — C7A1 Malignant poorly differentiated neuroendocrine tumors: Secondary | ICD-10-CM | POA: Insufficient documentation

## 2014-09-23 MED ORDER — ALPRAZOLAM 0.25 MG PO TABS: 0.2500 mg | ORAL_TABLET | Freq: Two times a day (BID) | ORAL | Status: DC | PRN

## 2014-09-23 NOTE — Progress Notes (Signed)
Weight and vital stable. Reports a normal appetite. Reports shortness of breath with exertion but, confirms she recovers quickly. Intermittent dry cough noted. Hoarseness noted. Denies difficulty swallowing. Denies hemoptysis. Denies pain. Denies headache, dizziness, nausea or vomiting.

## 2014-09-23 NOTE — Progress Notes (Signed)
See progress note under physician encounter. 

## 2014-09-25 ENCOUNTER — Telehealth: Payer: Self-pay | Admitting: Radiation Oncology

## 2014-09-25 NOTE — Telephone Encounter (Signed)
Phoned and requested patient present to [place] after MRI tomorrow for 2 pm simulation. Patient agreed. Patient states, "this will work great _________I won't have to make another trip Friday."

## 2014-09-26 ENCOUNTER — Ambulatory Visit (HOSPITAL_COMMUNITY)
Admission: RE | Admit: 2014-09-26 | Discharge: 2014-09-26 | Disposition: A | Discharge status: Home / Self Care | Payer: Medicare Other | Source: Ambulatory Visit | Attending: Radiation Oncology | Admitting: Radiation Oncology

## 2014-09-26 ENCOUNTER — Encounter: Payer: Self-pay | Admitting: Radiation Oncology

## 2014-09-26 ENCOUNTER — Ambulatory Visit
Admission: RE | Admit: 2014-09-26 | Discharge: 2014-09-26 | Disposition: A | Discharge status: Home / Self Care | Payer: Medicare Other | Source: Ambulatory Visit | Attending: Radiation Oncology | Admitting: Radiation Oncology

## 2014-09-26 DIAGNOSIS — Z510 Encounter for antineoplastic radiation therapy: Secondary | ICD-10-CM | POA: Diagnosis present

## 2014-09-26 DIAGNOSIS — C342 Malignant neoplasm of middle lobe, bronchus or lung: Secondary | ICD-10-CM | POA: Diagnosis not present

## 2014-09-26 DIAGNOSIS — C3491 Malignant neoplasm of unspecified part of right bronchus or lung: Secondary | ICD-10-CM | POA: Insufficient documentation

## 2014-09-26 DIAGNOSIS — C3490 Malignant neoplasm of unspecified part of unspecified bronchus or lung: Secondary | ICD-10-CM

## 2014-09-26 MED ORDER — GADOBENATE DIMEGLUMINE 529 MG/ML IV SOLN
13.0000 mL | Freq: Once | INTRAVENOUS | Status: AC | PRN
  Administered 2014-09-26: 13 mL via INTRAVENOUS

## 2014-09-26 NOTE — Progress Notes (Signed)
  Radiation Oncology         [phone] ________________________________  Name: Frances Gonzalez MRN: [id number]  Date: 09/26/2014  DOB: 1934/12/29  SIMULATION AND TREATMENT PLANNING NOTE  DIAGNOSIS:  79 yo woman with extensive stage small cell lung cancer s/p 6 cycles of chemo with residual mediastinal disease  NARRATIVE:  The patient was brought to the [facility].  Identity was confirmed.  All relevant records and images related to the planned course of therapy were reviewed.  The patient freely provided informed written consent to proceed with treatment after reviewing the details related to the planned course of therapy. The consent form was witnessed and verified by the simulation staff.  Then, the patient was set-up in a stable reproducible  supine position for radiation therapy.  CT images were obtained.  Surface markings were placed.  The CT images were loaded into the planning software.  Then the target and avoidance structures were contoured.  Treatment planning then occurred.  The radiation prescription was entered and confirmed.  Then, I designed and supervised the construction of a total of 5 medically necessary complex treatment devices in the form of MLC apertures shielding the uninvolved lung, hearts, .  I have requested : 3D Simulation  I have requested a DVH of the following structures: lungs, heart, spinal cord and target.  I have ordered:Nutrition Consult and CBC  PLAN:  The patient will receive 35 Gy in 14 fractions.  ________________________________  Sheral Apley Tammi Klippel, M.D.

## 2014-09-27 ENCOUNTER — Telehealth: Payer: Self-pay | Admitting: Radiation Oncology

## 2014-09-27 ENCOUNTER — Other Ambulatory Visit: Payer: Self-pay | Admitting: Radiation Oncology

## 2014-09-27 NOTE — Telephone Encounter (Signed)
Message copied by Heywood Footman on Fri Sep 27, 2014  1:53 PM ------      Message from: [place], [place]      Created: Fri Sep 27, 2014  1:34 PM       Please call patient with normal result.            Thanks. MM ------

## 2014-09-27 NOTE — Telephone Encounter (Signed)
Phoned patient with normal result of recent scan as directed by Dr. Tammi Klippel. Patient verbalized understanding and expressed appreciation for the call.

## 2014-09-27 NOTE — Progress Notes (Signed)
Quick Note:  Please call patient with normal result.  Thanks. MM ______

## 2014-09-30 ENCOUNTER — Other Ambulatory Visit: Payer: Self-pay | Admitting: Radiation Oncology

## 2014-10-03 DIAGNOSIS — Z510 Encounter for antineoplastic radiation therapy: Secondary | ICD-10-CM | POA: Diagnosis not present

## 2014-10-04 ENCOUNTER — Ambulatory Visit
Admission: RE | Admit: 2014-10-04 | Discharge: 2014-10-04 | Disposition: A | Discharge status: Home / Self Care | Payer: Medicare Other | Source: Ambulatory Visit | Attending: Radiation Oncology | Admitting: Radiation Oncology

## 2014-10-04 DIAGNOSIS — Z85118 Personal history of other malignant neoplasm of bronchus and lung: Secondary | ICD-10-CM

## 2014-10-04 DIAGNOSIS — Z510 Encounter for antineoplastic radiation therapy: Secondary | ICD-10-CM | POA: Diagnosis not present

## 2014-10-04 NOTE — Progress Notes (Signed)
  Radiation Oncology         [phone] ________________________________  Name: Frances Gonzalez MRN: [id number]  Date: 10/04/2014  DOB: [birth year]  Simulation Verification Note  Status: outpatient  NARRATIVE: The patient was brought to the treatment unit and placed in the planned treatment position. The clinical setup was verified. Then port films were obtained and uploaded to the radiation oncology medical record software.  The treatment beams were carefully compared against the planned radiation fields. The position location and shape of the radiation fields was reviewed. They targeted volume of tissue appears to be appropriately covered by the radiation beams. Organs at risk appear to be excluded as planned.  Based on my personal review, I approved the simulation verification. The patient's treatment will proceed as planned.  ------------------------------------------------  Sheral Apley Tammi Klippel, M.D.

## 2014-10-07 ENCOUNTER — Ambulatory Visit
Admission: RE | Admit: 2014-10-07 | Discharge: 2014-10-07 | Disposition: A | Discharge status: Home / Self Care | Payer: Medicare Other | Source: Ambulatory Visit | Attending: Radiation Oncology | Admitting: Radiation Oncology

## 2014-10-07 DIAGNOSIS — Z510 Encounter for antineoplastic radiation therapy: Secondary | ICD-10-CM | POA: Diagnosis not present

## 2014-10-08 ENCOUNTER — Ambulatory Visit
Admission: RE | Admit: 2014-10-08 | Discharge: 2014-10-08 | Disposition: A | Discharge status: Home / Self Care | Payer: Medicare Other | Source: Ambulatory Visit | Attending: Radiation Oncology | Admitting: Radiation Oncology

## 2014-10-08 DIAGNOSIS — Z510 Encounter for antineoplastic radiation therapy: Secondary | ICD-10-CM | POA: Diagnosis not present

## 2014-10-09 ENCOUNTER — Ambulatory Visit
Admission: RE | Admit: 2014-10-09 | Discharge: 2014-10-09 | Disposition: A | Discharge status: Home / Self Care | Payer: Medicare Other | Source: Ambulatory Visit | Attending: Radiation Oncology | Admitting: Radiation Oncology

## 2014-10-09 DIAGNOSIS — Z510 Encounter for antineoplastic radiation therapy: Secondary | ICD-10-CM | POA: Diagnosis not present

## 2014-10-10 ENCOUNTER — Encounter: Payer: Self-pay | Admitting: Radiation Oncology

## 2014-10-10 ENCOUNTER — Ambulatory Visit
Admission: RE | Admit: 2014-10-10 | Discharge: 2014-10-10 | Disposition: A | Discharge status: Home / Self Care | Payer: Medicare Other | Source: Ambulatory Visit | Attending: Radiation Oncology | Admitting: Radiation Oncology

## 2014-10-10 VITALS — BP 121/81 | HR 115 | Temp 97.5°F | Resp 12 | Wt 142.4 lb

## 2014-10-10 DIAGNOSIS — Z510 Encounter for antineoplastic radiation therapy: Secondary | ICD-10-CM | POA: Diagnosis not present

## 2014-10-10 DIAGNOSIS — C3491 Malignant neoplasm of unspecified part of right bronchus or lung: Secondary | ICD-10-CM

## 2014-10-10 NOTE — Progress Notes (Signed)
  Radiation Oncology         [phone] ________________________________   Name: Frances Gonzalez MRN: [id number]  Date: 10/10/2014  DOB: 11-07-1935  Weekly Radiation Therapy Management    ICD-9-CM ICD-10-CM  1. Small cell lung carcinoma, right 162.9 C34.91    Current Dose: 10 Gy     Planned Dose:  35 Gy  Narrative . . . . . . . . The patient presents for routine under treatment assessment.                                   She is currently in no pain. Pt has no complaints, she reports "feeling just fine." Reports occasional Shortness of Breath when climbing-Stairs. Pt is on room air. Pt denies dysphagia. The patient eats a regular, healthy diet.                                 Set-up films were reviewed.                                 The chart was checked. Physical Findings. . .  weight is 142 lb 6.4 oz (64.592 kg). Her oral temperature is 97.5 F (36.4 C). Her blood pressure is 121/81 and her pulse is 115. Her respiration is 12 and oxygen saturation is 100%. . Weight essentially stable.  No significant changes. Impression . . . . . . . The patient is tolerating radiation. Plan . . . . . . . . . . . . Continue treatment as planned.  ________________________________  Sheral Apley. Tammi Klippel, M.D.

## 2014-10-10 NOTE — Progress Notes (Signed)
She is currently in no pain. Pt has no complaints, she reports "feeling just fine." Reports occasional Shortness of Breath when climbing-Stairs. Pt is on room air. Pt denies dysphagia. The patient eats a regular, healthy diet.

## 2014-10-11 ENCOUNTER — Ambulatory Visit
Admission: RE | Admit: 2014-10-11 | Discharge: 2014-10-11 | Disposition: A | Discharge status: Home / Self Care | Payer: Medicare Other | Source: Ambulatory Visit | Attending: Radiation Oncology | Admitting: Radiation Oncology

## 2014-10-11 DIAGNOSIS — Z510 Encounter for antineoplastic radiation therapy: Secondary | ICD-10-CM | POA: Diagnosis not present

## 2014-10-14 ENCOUNTER — Ambulatory Visit
Admission: RE | Admit: 2014-10-14 | Discharge: 2014-10-14 | Disposition: A | Discharge status: Home / Self Care | Payer: Medicare Other | Source: Ambulatory Visit | Attending: Radiation Oncology | Admitting: Radiation Oncology

## 2014-10-14 DIAGNOSIS — Z510 Encounter for antineoplastic radiation therapy: Secondary | ICD-10-CM | POA: Diagnosis not present

## 2014-10-15 ENCOUNTER — Ambulatory Visit
Admission: RE | Admit: 2014-10-15 | Discharge: 2014-10-15 | Disposition: A | Discharge status: Home / Self Care | Payer: Medicare Other | Source: Ambulatory Visit | Attending: Radiation Oncology | Admitting: Radiation Oncology

## 2014-10-15 DIAGNOSIS — Z510 Encounter for antineoplastic radiation therapy: Secondary | ICD-10-CM | POA: Diagnosis not present

## 2014-10-16 ENCOUNTER — Ambulatory Visit
Admission: RE | Admit: 2014-10-16 | Discharge: 2014-10-16 | Disposition: A | Discharge status: Home / Self Care | Payer: Medicare Other | Source: Ambulatory Visit | Attending: Radiation Oncology | Admitting: Radiation Oncology

## 2014-10-16 DIAGNOSIS — Z510 Encounter for antineoplastic radiation therapy: Secondary | ICD-10-CM | POA: Diagnosis not present

## 2014-10-17 ENCOUNTER — Ambulatory Visit
Admission: RE | Admit: 2014-10-17 | Discharge: 2014-10-17 | Disposition: A | Discharge status: Home / Self Care | Payer: Medicare Other | Source: Ambulatory Visit | Attending: Radiation Oncology | Admitting: Radiation Oncology

## 2014-10-17 DIAGNOSIS — Z510 Encounter for antineoplastic radiation therapy: Secondary | ICD-10-CM | POA: Diagnosis not present

## 2014-10-18 ENCOUNTER — Ambulatory Visit
Admission: RE | Admit: 2014-10-18 | Discharge: 2014-10-18 | Disposition: A | Discharge status: Home / Self Care | Payer: Medicare Other | Source: Ambulatory Visit | Attending: Radiation Oncology | Admitting: Radiation Oncology

## 2014-10-18 ENCOUNTER — Encounter: Payer: Self-pay | Admitting: Radiation Oncology

## 2014-10-18 VITALS — BP 99/77 | HR 119 | Resp 16 | Wt 141.2 lb

## 2014-10-18 DIAGNOSIS — C342 Malignant neoplasm of middle lobe, bronchus or lung: Secondary | ICD-10-CM | POA: Insufficient documentation

## 2014-10-18 DIAGNOSIS — R05 Cough: Secondary | ICD-10-CM | POA: Insufficient documentation

## 2014-10-18 DIAGNOSIS — Z510 Encounter for antineoplastic radiation therapy: Secondary | ICD-10-CM | POA: Diagnosis not present

## 2014-10-18 MED ORDER — RADIAPLEXRX EX GEL
Freq: Once | CUTANEOUS | Status: AC
  Administered 2014-10-18: 14:00:00 via TOPICAL

## 2014-10-18 NOTE — Progress Notes (Signed)
Reports an occasional dry cough. Denies shortness of breath. Denies difficulty swallowing. Denies skin changes. Weight and vitals stable. States, "I feel good today." Denies fatigue.

## 2014-10-18 NOTE — Progress Notes (Signed)
  Radiation Oncology         [phone] ________________________________    Name: Frances Gonzalez MRN: [id number]  Date: 10/18/2014  DOB: April 16, 1935  Weekly Radiation Therapy Management    ICD-9-CM ICD-10-CM  1. metastatic small cell cancer of right middle lobe of lung 162.4 C34.2   Current Dose: 22.5 Gy     Planned Dose:  35 Gy  Narrative . . . . . . . . The patient presents for routine under treatment assessment.                                   Reports an occasional dry cough. Denies shortness of breath. Denies difficulty swallowing. Denies skin changes. Weight and vitals stable. States, "I feel good today." Denies fatigue.                                 Set-up films were reviewed.                                 The chart was checked. Physical Findings. . .  weight is 141 lb 3.2 oz (64.048 kg). Her blood pressure is 99/77 and her pulse is 119. Her respiration is 16 and oxygen saturation is 98%. . Weight essentially stable.  No significant changes. Impression . . . . . . . The patient is tolerating radiation. Plan . . . . . . . . . . . . Continue treatment as planned.  ________________________________  Sheral Apley. Tammi Klippel, M.D.

## 2014-10-21 ENCOUNTER — Ambulatory Visit
Admission: RE | Admit: 2014-10-21 | Discharge: 2014-10-21 | Disposition: A | Discharge status: Home / Self Care | Payer: Medicare Other | Source: Ambulatory Visit | Attending: Radiation Oncology | Admitting: Radiation Oncology

## 2014-10-21 ENCOUNTER — Telehealth: Payer: Self-pay | Admitting: Radiation Oncology

## 2014-10-21 ENCOUNTER — Other Ambulatory Visit: Payer: Self-pay | Admitting: Radiation Oncology

## 2014-10-21 DIAGNOSIS — Z510 Encounter for antineoplastic radiation therapy: Secondary | ICD-10-CM | POA: Diagnosis not present

## 2014-10-21 DIAGNOSIS — C3490 Malignant neoplasm of unspecified part of unspecified bronchus or lung: Secondary | ICD-10-CM

## 2014-10-21 MED ORDER — SUCRALFATE 1 G PO TABS: 1.0000 g | ORAL_TABLET | Freq: Three times a day (TID) | ORAL | Status: DC

## 2014-10-21 NOTE — Telephone Encounter (Signed)
Called patient to inform her carafate has been escribed to her [employer] Patient verbalized understanding and expressed appreciation for the call.

## 2014-10-22 ENCOUNTER — Ambulatory Visit
Admission: RE | Admit: 2014-10-22 | Discharge: 2014-10-22 | Disposition: A | Discharge status: Home / Self Care | Payer: Medicare Other | Source: Ambulatory Visit | Attending: Radiation Oncology | Admitting: Radiation Oncology

## 2014-10-22 DIAGNOSIS — Z510 Encounter for antineoplastic radiation therapy: Secondary | ICD-10-CM | POA: Diagnosis not present

## 2014-10-23 ENCOUNTER — Ambulatory Visit
Admission: RE | Admit: 2014-10-23 | Discharge: 2014-10-23 | Disposition: A | Discharge status: Home / Self Care | Payer: Medicare Other | Source: Ambulatory Visit | Attending: Radiation Oncology | Admitting: Radiation Oncology

## 2014-10-23 DIAGNOSIS — Z510 Encounter for antineoplastic radiation therapy: Secondary | ICD-10-CM | POA: Diagnosis not present

## 2014-10-24 ENCOUNTER — Inpatient Hospital Stay
Admission: RE | Admit: 2014-10-24 | Discharge: 2014-10-24 | Disposition: A | Discharge status: Home / Self Care | Payer: Medicare Other | Source: Ambulatory Visit | Attending: Radiation Oncology | Admitting: Radiation Oncology

## 2014-10-24 ENCOUNTER — Encounter: Payer: Self-pay | Admitting: Radiation Oncology

## 2014-10-24 ENCOUNTER — Ambulatory Visit
Admission: RE | Admit: 2014-10-24 | Discharge: 2014-10-24 | Disposition: A | Discharge status: Home / Self Care | Payer: Medicare Other | Source: Ambulatory Visit | Attending: Radiation Oncology | Admitting: Radiation Oncology

## 2014-10-24 VITALS — BP 121/87 | HR 123 | Temp 97.7°F | Resp 20 | Ht 61.0 in | Wt 141.6 lb

## 2014-10-24 DIAGNOSIS — Z510 Encounter for antineoplastic radiation therapy: Secondary | ICD-10-CM | POA: Diagnosis not present

## 2014-10-24 DIAGNOSIS — C342 Malignant neoplasm of middle lobe, bronchus or lung: Secondary | ICD-10-CM

## 2014-10-24 NOTE — Progress Notes (Signed)
Weekly rad txs right lung, 14/14 completd, no c/o pain, just indigestion, is taking the carafte as directed, helps long enough for her to eat her food, no nausea, no coughing, room air sats 99%, already has f/u appt,  Dec 3rd,  Energy level good 11:48 AM

## 2014-10-24 NOTE — Progress Notes (Signed)
  Radiation Oncology         [phone] ________________________________  Name: Frances Gonzalez MRN: [id number]  Date: 10/24/2014  DOB: [birth year]  Weekly Radiation Therapy Management    ICD-9-CM ICD-10-CM  1. metastatic small cell cancer of right middle lobe of lung 162.4 C34.2    Current Dose: 35 Gy     Planned Dose:  35 Gy  Narrative . . . . . . . . The patient presents for routine under treatment assessment.                                   Weekly rad txs right lung, 14/14 completd, no c/o pain, just indigestion, is taking the carafte as directed, helps long enough for her to eat her food, no nausea, no coughing, room air sats 99%, already has f/u appt,  Dec 3rd, Energy level good                                 Set-up films were reviewed.                                 The chart was checked. Physical Findings. . .  height is 5\' 1"  (1.549 m) and weight is 141 lb 9.6 oz (64.229 kg). Her oral temperature is 97.7 F (36.5 C). Her blood pressure is 121/87 and her pulse is 123. Her respiration is 20 and oxygen saturation is 99%. . Weight essentially stable.  No significant changes. Impression . . . . . . . The patient is tolerated radiation. Plan . . . . . . . . . . . . The patient completes radiation today. She was given a follow-up appointment card to return to our office in one month. She understands to call or return sooner if she has any questions or complaints..  ________________________________  Sheral Apley Tammi Klippel, M.D.

## 2014-10-27 NOTE — Progress Notes (Signed)
°  Radiation Oncology         (336) [phone] ________________________________  Name: Frances Gonzalez MRN: [id number]  Date: 10/24/2014  DOB: 22-Nov-1935  End of Treatment Note  Diagnosis:  79 yo woman with extensive stage small cell lung cancer s/p 6 cycles of chemo with residual mediastinal disease  Indication for treatment:  Palliation of airway symptoms       Radiation treatment dates:   10/07/2014-10/24/2014  Site/dose:   The mediastinum was treated to 35 Gy in 14 fractions of 2.5 Gy  Beams/energy:   The patient was treated with 3D conformal radiotherapy using 4 static gantry beams (AP, PA, LAO, RPO) and a dynamic conformal arc (87-139 degrees) with 6 and 10 MV X-rays  Narrative: The patient tolerated radiation treatment relatively well.   She had some esophagitis symptoms treated with carafate.  Plan: The patient has completed radiation treatment. The patient will return to radiation oncology clinic for routine followup in one month. I advised them to call or return sooner if they have any questions or concerns related to their recovery or treatment. ________________________________  Sheral Apley. Tammi Klippel, M.D.

## 2014-11-28 ENCOUNTER — Ambulatory Visit
Admission: RE | Admit: 2014-11-28 | Discharge: 2014-11-28 | Disposition: A | Discharge status: Home / Self Care | Payer: Medicare Other | Source: Ambulatory Visit | Attending: Radiation Oncology | Admitting: Radiation Oncology

## 2014-11-28 ENCOUNTER — Encounter: Payer: Self-pay | Admitting: Radiation Oncology

## 2014-11-28 VITALS — Resp 18 | Wt 140.2 lb

## 2014-11-28 DIAGNOSIS — C342 Malignant neoplasm of middle lobe, bronchus or lung: Secondary | ICD-10-CM

## 2014-11-28 NOTE — Progress Notes (Signed)
Complains of fatigue, feeling achy all over, and increased sinus drainage for two weeks now. Questions if she is anemic. Scheduled for lab work on 12/28. Weight stable per clinic scales but, patient reports a loss of 5 lb in one month. Reports decreased appetite. Reports esophagitis resolved. Denies shortness of breath or cough. Treated skin of normal color and appearance. States, "I am breathing as well as I ever had." Denies pain. Vitals stable.

## 2014-11-28 NOTE — Progress Notes (Signed)
  Radiation Oncology         ([phone]) [phone] ________________________________  Name: Frances Gonzalez MRN: [id number]  Date: 11/28/2014  DOB: [birth year]  Follow-Up Visit Note  CC: TODD,JEFFREY ALLEN, MD  Curt Bears, MD  Diagnosis:   79 yo woman with extensive stage small cell lung cancer s/p 6 cycles of chemo with residual mediastinal disease    ICD-9-CM ICD-10-CM   1. 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  Complains of fatigue, feeling achy all over, and increased sinus drainage for two weeks now. Questions if she is anemic. Scheduled for lab work on 12/28. Weight stable per clinic scales but, patient reports a loss of 5 lb in one month. Reports decreased appetite. Reports esophagitis resolved. Denies shortness of breath or cough. Treated skin of normal color and appearance. States, "I am breathing as well as I ever had." Denies pain. Vitals stable.                              ALLERGIES:  has No Known Allergies.  Meds: Current Outpatient Prescriptions  Medication Sig Dispense Refill  . irbesartan (AVAPRO) 150 MG tablet 150 mg. Half tablet daily    . lidocaine-prilocaine (EMLA) cream Apply 1 application topically as needed. 30 g 0  . Multiple Vitamin (MULTIVITAMIN) tablet Take 1 tablet by mouth daily.    . pantoprazole (PROTONIX) 40 MG tablet Take 40 mg by mouth daily.    . simvastatin (ZOCOR) 20 MG tablet TAKE 1/2 TABLET BY MOUTH EVERY NIGHT AT BEDTIME 100 tablet 2  . ALPRAZolam (XANAX) 0.25 MG tablet Take 1 tablet (0.25 mg total) by mouth 2 (two) times daily as needed for anxiety (take 20 minutes before MRI). (Patient not taking: Reported on 11/28/2014) 30 tablet 0   No current facility-administered medications for this encounter.    Physical Findings: The patient is in no acute distress. Patient is alert and oriented.  weight is 140 lb 3.2  oz (63.594 kg). Her respiration is 18. .  No significant changes.  Lab Findings: Lab Results  Component Value Date   WBC 9.3 09/11/2014   HGB 9.0* 09/11/2014   HCT 26.2* 09/11/2014   MCV 98.1 09/11/2014   PLT 163 09/11/2014   Impression:  The patient is recovering from the effects of radiation.    Plan:  Follow-up with Dr. Julien Nordmann.  If she elects not to get prophylactic cranial irradiation, which is her current desire, I would recommend consideration for brain MRI along with subsequent re-staging CTs.  _____________________________________  Sheral Apley. Tammi Klippel, M.D.

## 2014-12-11 ENCOUNTER — Telehealth: Payer: Self-pay | Admitting: *Deleted

## 2014-12-11 NOTE — Telephone Encounter (Signed)
Pt called stating that she is having increased SOB.  Denies fever or cough.  She is still able to do ADL's.  She wanted to know if Dr Vista Mink wanted to see her sooner.Will discuss with Dr Vista Mink and call her back.  She verbalized understanding.

## 2014-12-12 ENCOUNTER — Telehealth: Payer: Self-pay | Admitting: Internal Medicine

## 2014-12-12 ENCOUNTER — Other Ambulatory Visit: Payer: Self-pay | Admitting: Medical Oncology

## 2014-12-12 ENCOUNTER — Telehealth: Payer: Self-pay | Admitting: *Deleted

## 2014-12-12 NOTE — Telephone Encounter (Signed)
s.w. pt and advosed on 12.28 ct and lab moved to 12.18 per MD....pt aware of lab at 1pm and ct at 2pm....she will arrive at 12 to get water base

## 2014-12-12 NOTE — Telephone Encounter (Signed)
Pt called stating that she has been having increased SOB with activity the past few days.  Pt scheduled for CT 12/28.  No distress, she is still able to do her ADLs, no fever or new cough.  Per Dr Vista Mink pt can have CT scan sooner. Onc tx scheduled filled out.

## 2014-12-13 ENCOUNTER — Encounter (HOSPITAL_COMMUNITY): Payer: Self-pay | Admitting: Emergency Medicine

## 2014-12-13 ENCOUNTER — Emergency Department (HOSPITAL_COMMUNITY): Payer: Medicare Other

## 2014-12-13 ENCOUNTER — Other Ambulatory Visit: Payer: Self-pay

## 2014-12-13 ENCOUNTER — Inpatient Hospital Stay (HOSPITAL_COMMUNITY): Payer: Medicare Other

## 2014-12-13 ENCOUNTER — Other Ambulatory Visit (HOSPITAL_BASED_OUTPATIENT_CLINIC_OR_DEPARTMENT_OTHER): Payer: Medicare Other

## 2014-12-13 ENCOUNTER — Ambulatory Visit (HOSPITAL_COMMUNITY)
Admission: RE | Admit: 2014-12-13 | Discharge: 2014-12-13 | Disposition: A | Discharge status: Home / Self Care | Payer: Medicare Other | Source: Ambulatory Visit | Attending: Internal Medicine | Admitting: Internal Medicine

## 2014-12-13 ENCOUNTER — Inpatient Hospital Stay (HOSPITAL_COMMUNITY)
Admission: EM | Admit: 2014-12-13 | Discharge: 2014-12-14 | DRG: 181 | Disposition: A | Discharge status: Home / Self Care | Payer: Medicare Other | Attending: Internal Medicine | Admitting: Internal Medicine

## 2014-12-13 ENCOUNTER — Encounter (HOSPITAL_COMMUNITY): Payer: Self-pay

## 2014-12-13 DIAGNOSIS — R0600 Dyspnea, unspecified: Secondary | ICD-10-CM

## 2014-12-13 DIAGNOSIS — Z923 Personal history of irradiation: Secondary | ICD-10-CM

## 2014-12-13 DIAGNOSIS — E785 Hyperlipidemia, unspecified: Secondary | ICD-10-CM | POA: Diagnosis present

## 2014-12-13 DIAGNOSIS — Z79899 Other long term (current) drug therapy: Secondary | ICD-10-CM

## 2014-12-13 DIAGNOSIS — C7A1 Malignant poorly differentiated neuroendocrine tumors: Secondary | ICD-10-CM

## 2014-12-13 DIAGNOSIS — I10 Essential (primary) hypertension: Secondary | ICD-10-CM | POA: Diagnosis present

## 2014-12-13 DIAGNOSIS — Z87891 Personal history of nicotine dependence: Secondary | ICD-10-CM | POA: Diagnosis not present

## 2014-12-13 DIAGNOSIS — R0602 Shortness of breath: Secondary | ICD-10-CM | POA: Diagnosis present

## 2014-12-13 DIAGNOSIS — J439 Emphysema, unspecified: Secondary | ICD-10-CM | POA: Diagnosis present

## 2014-12-13 DIAGNOSIS — K219 Gastro-esophageal reflux disease without esophagitis: Secondary | ICD-10-CM | POA: Diagnosis present

## 2014-12-13 DIAGNOSIS — C3490 Malignant neoplasm of unspecified part of unspecified bronchus or lung: Secondary | ICD-10-CM

## 2014-12-13 DIAGNOSIS — R0902 Hypoxemia: Secondary | ICD-10-CM | POA: Insufficient documentation

## 2014-12-13 DIAGNOSIS — C342 Malignant neoplasm of middle lobe, bronchus or lung: Principal | ICD-10-CM | POA: Diagnosis present

## 2014-12-13 DIAGNOSIS — Z825 Family history of asthma and other chronic lower respiratory diseases: Secondary | ICD-10-CM

## 2014-12-13 DIAGNOSIS — C799 Secondary malignant neoplasm of unspecified site: Secondary | ICD-10-CM | POA: Diagnosis present

## 2014-12-13 LAB — I-STAT TROPONIN, ED: Troponin i, poc: 0.01 ng/mL (ref 0.00–0.08)

## 2014-12-13 LAB — CBC WITH DIFFERENTIAL/PLATELET
BASO%: 0.8 % (ref 0.0–2.0)
BASOS ABS: 0 10*3/uL (ref 0.0–0.1)
EOS%: 2.3 % (ref 0.0–7.0)
Eosinophils Absolute: 0.1 10*3/uL (ref 0.0–0.5)
HCT: 32.2 % — ABNORMAL LOW (ref 34.8–46.6)
HEMOGLOBIN: 11 g/dL — AB (ref 11.6–15.9)
LYMPH%: 6.4 % — ABNORMAL LOW (ref 14.0–49.7)
MCH: 31.1 pg (ref 25.1–34.0)
MCHC: 34.1 g/dL (ref 31.5–36.0)
MCV: 91.1 fL (ref 79.5–101.0)
MONO#: 0.9 10*3/uL (ref 0.1–0.9)
MONO%: 17.5 % — AB (ref 0.0–14.0)
NEUT#: 3.9 10*3/uL (ref 1.5–6.5)
NEUT%: 73 % (ref 38.4–76.8)
Platelets: 225 10*3/uL (ref 145–400)
RBC: 3.54 10*6/uL — ABNORMAL LOW (ref 3.70–5.45)
RDW: 13.1 % (ref 11.2–14.5)
WBC: 5.3 10*3/uL (ref 3.9–10.3)
lymph#: 0.3 10*3/uL — ABNORMAL LOW (ref 0.9–3.3)

## 2014-12-13 LAB — COMPREHENSIVE METABOLIC PANEL
ALT: 10 U/L (ref 0–35)
AST: 22 U/L (ref 0–37)
Albumin: 3.8 g/dL (ref 3.5–5.2)
Alkaline Phosphatase: 90 U/L (ref 39–117)
Anion gap: 19 — ABNORMAL HIGH (ref 5–15)
BUN: 16 mg/dL (ref 6–23)
CHLORIDE: 89 meq/L — AB (ref 96–112)
CO2: 21 mEq/L (ref 19–32)
Calcium: 9.7 mg/dL (ref 8.4–10.5)
Creatinine, Ser: 0.77 mg/dL (ref 0.50–1.10)
GFR calc Af Amer: >90 mL/min (ref >90)
GFR, EST NON AFRICAN AMERICAN: 79 mL/min — AB (ref >90)
Glucose, Bld: 112 mg/dL — ABNORMAL HIGH (ref 70–99)
POTASSIUM: 4 meq/L (ref 3.7–5.3)
Sodium: 129 mEq/L — ABNORMAL LOW (ref 137–147)
Total Bilirubin: 0.5 mg/dL (ref 0.3–1.2)
Total Protein: 7.5 g/dL (ref 6.0–8.3)

## 2014-12-13 LAB — COMPREHENSIVE METABOLIC PANEL (CC13)
ALK PHOS: 88 U/L (ref 40–150)
ALT: 12 U/L (ref 0–55)
AST: 22 U/L (ref 5–34)
Albumin: 3.7 g/dL (ref 3.5–5.0)
Anion Gap: 14 mEq/L — ABNORMAL HIGH (ref 3–11)
BUN: 18.6 mg/dL (ref 7.0–26.0)
CO2: 24 mEq/L (ref 22–29)
CREATININE: 0.9 mg/dL (ref 0.6–1.1)
Calcium: 9.7 mg/dL (ref 8.4–10.4)
Chloride: 94 mEq/L — ABNORMAL LOW (ref 98–109)
EGFR: 60 mL/min/{1.73_m2} — ABNORMAL LOW (ref >90)
Glucose: 102 mg/dl (ref 70–140)
POTASSIUM: 4 meq/L (ref 3.5–5.1)
Sodium: 132 mEq/L — ABNORMAL LOW (ref 136–145)
Total Bilirubin: 0.55 mg/dL (ref 0.20–1.20)
Total Protein: 7.4 g/dL (ref 6.4–8.3)

## 2014-12-13 LAB — CBC
HEMATOCRIT: 29.1 % — AB (ref 36.0–46.0)
Hemoglobin: 10.1 g/dL — ABNORMAL LOW (ref 12.0–15.0)
MCH: 30.9 pg (ref 26.0–34.0)
MCHC: 34.7 g/dL (ref 30.0–36.0)
MCV: 89 fL (ref 78.0–100.0)
Platelets: 217 10*3/uL (ref 150–400)
RBC: 3.27 MIL/uL — ABNORMAL LOW (ref 3.87–5.11)
RDW: 13.2 % (ref 11.5–15.5)
WBC: 6 10*3/uL (ref 4.0–10.5)

## 2014-12-13 LAB — PRO B NATRIURETIC PEPTIDE: Pro B Natriuretic peptide (BNP): 268.9 pg/mL (ref 0–450)

## 2014-12-13 MED ORDER — ENOXAPARIN SODIUM 60 MG/0.6ML ~~LOC~~ SOLN
1.0000 mg/kg | Freq: Once | SUBCUTANEOUS | Status: AC
  Administered 2014-12-13: 60 mg via SUBCUTANEOUS
  Filled 2014-12-13: qty 0.6

## 2014-12-13 MED ORDER — IOHEXOL 350 MG/ML SOLN: 100.0000 mL | Freq: Once | INTRAVENOUS | Status: AC | PRN

## 2014-12-13 MED ORDER — IOHEXOL 300 MG/ML  SOLN
50.0000 mL | Freq: Once | INTRAMUSCULAR | Status: AC | PRN
  Administered 2014-12-13: 50 mL via ORAL

## 2014-12-13 MED ORDER — IOHEXOL 300 MG/ML  SOLN
100.0000 mL | Freq: Once | INTRAMUSCULAR | Status: AC | PRN
  Administered 2014-12-13: 100 mL via INTRAVENOUS

## 2014-12-13 MED ORDER — SODIUM CHLORIDE 0.9 % IV BOLUS (SEPSIS)
500.0000 mL | Freq: Once | INTRAVENOUS | Status: AC
  Administered 2014-12-14: 500 mL via INTRAVENOUS

## 2014-12-13 MED ORDER — SODIUM CHLORIDE 0.9 % IV SOLN
INTRAVENOUS | Status: DC
  Administered 2014-12-13: 125 mL/h via INTRAVENOUS

## 2014-12-13 NOTE — ED Provider Notes (Signed)
CSN: [id number]     Arrival date & time 12/13/14  2008 History   First MD Initiated Contact with Patient 12/13/14 2102     Chief Complaint  Patient presents with  . Blood Clot L groin    . Shortness of Breath     (Consider location/radiation/quality/duration/timing/severity/associated sxs/prior Treatment) Patient is a 79 y.o. female presenting with shortness of breath.  Shortness of Breath  Complains of shortness of breath for approximately the past 10 days. Shortness of breath worse with exertion improved with rest denies cough denies fever denies other associated symptoms. No treatment prior to coming here. She had outpatient CT scans of chest abdomen and pelvis today which showed a possible left femoral DVT and small pericardial effusion she denies other complaint. No other associated symptoms no treatment prior to coming here. She denies leg pain or swelling denies other associated symptoms Past Medical History  Diagnosis Date  . Hypertension   . Lung cancer dx'd 2010 & 2015    2 primary lung ca rul and mediastinum   Past Surgical History  Procedure Laterality Date  . Lung cancer surgery  02/24/04    RUL Dr Arlyce Dice   Family History  Problem Relation Age of Onset  . Emphysema Maternal Grandmother     smoked  . Cancer Neg Hx    History  Substance Use Topics  . Smoking status: Former Smoker -- 1.00 packs/day for 50 years    Types: Cigarettes    Quit date: 02/24/2004  . Smokeless tobacco: Never Used  . Alcohol Use: No   OB History    No data available     Review of Systems  Constitutional: Negative.   HENT: Negative.   Respiratory: Positive for shortness of breath.   Cardiovascular: Negative.   Gastrointestinal: Negative.   Musculoskeletal: Negative.   Skin: Negative.   Neurological: Negative.   Psychiatric/Behavioral: Negative.   All other systems reviewed and are negative.     Allergies  Review of patient's allergies indicates no known allergies.  Home  Medications   Prior to Admission medications   Medication Sig Start Date End Date Taking? Authorizing Provider  ALPRAZolam (XANAX) 0.25 MG tablet Take 1 tablet (0.25 mg total) by mouth 2 (two) times daily as needed for anxiety (take 20 minutes before MRI). Patient not taking: Reported on 11/28/2014 09/23/14   Lora Paula, MD  irbesartan (AVAPRO) 150 MG tablet 150 mg. Half tablet daily    Historical Provider, MD  lidocaine-prilocaine (EMLA) cream Apply 1 application topically as needed. 04/30/14   Curt Bears, MD  Multiple Vitamin (MULTIVITAMIN) tablet Take 1 tablet by mouth daily.    Historical Provider, MD  pantoprazole (PROTONIX) 40 MG tablet Take 40 mg by mouth daily. 09/02/14   Historical Provider, MD  simvastatin (ZOCOR) 20 MG tablet TAKE 1/2 TABLET BY MOUTH EVERY NIGHT AT BEDTIME 09/23/14   Dorena Cookey, MD   BP 117/72 mmHg  Pulse 124  Temp(Src) 98.2 F (36.8 C) (Oral)  Resp 15  Ht 5\' 1"  (1.549 m)  Wt 135 lb (61.236 kg)  BMI 25.52 kg/m2  SpO2 98% Physical Exam  Constitutional: She appears well-developed and well-nourished.  HENT:  Head: Normocephalic and atraumatic.  Eyes: Conjunctivae are normal. Pupils are equal, round, and reactive to light.  Neck: Neck supple. No tracheal deviation present. No thyromegaly present.  Cardiovascular: Regular rhythm.   No murmur heard. Tachycardic  Pulmonary/Chest: Effort normal and breath sounds normal.  Abdominal: Soft. Bowel sounds are normal.  She exhibits no distension. There is no tenderness.  Musculoskeletal: Normal range of motion. She exhibits no edema or tenderness.  Neurological: She is alert. Coordination normal.  Skin: Skin is warm and dry. No rash noted.  Psychiatric: She has a normal mood and affect.  Nursing note and vitals reviewed.   ED Course  Procedures (including critical care time) Labs Review Labs Reviewed  CBC  PRO B NATRIURETIC PEPTIDE  COMPREHENSIVE METABOLIC PANEL  I-STAT [place], ED    Imaging  Review Ct Chest W Contrast  12/13/2014   CLINICAL DATA:  Lung cancer. Chemotherapy completed in August, 2015. Radiation therapy completed in October 2015. Prior right upper lobectomy. Mediastinal mass contained small cell cancer.  EXAM: CT CHEST, ABDOMEN, AND PELVIS WITH CONTRAST  TECHNIQUE: Multidetector CT imaging of the chest, abdomen and pelvis was performed following the standard protocol during bolus administration of intravenous contrast.  CONTRAST:  1102mL OMNIPAQUE IOHEXOL 300 MG/ML  SOLN  COMPARISON:  09/11/2014  FINDINGS: CT CHEST FINDINGS  Indistinct loss of tissue planes in the subcarinal region. Difficult to measure due to the infiltrative nature of this process, grossly similar to prior. Right paratracheal node short axis diameter 5 mm on image 21 of series 2, stable. Stable small prevascular lymph nodes.  Small pericardial effusion. Small hiatal hernia. Tortuous thoracic aorta with ascending thoracic aorta measuring 3.9 cm transverse. Coronary artery atherosclerosis.  Biapical pleural parenchymal scarring. Extensive centrilobular emphysema. Chronic interstitial accentuation diffusely. Perihilar scarring bilaterally, possible Mild paramediastinal radiation pneumonitis.  Left lower lobe pulmonary nodule 0.8 by 0.6 cm on image 49 of series 4, essentially stable from 2010. Prior nodularity in the left suprahilar region along an area of volume loss measures 9 by 5 mm (image 23/series 4), and by my measurements was previously about 9 by 13 mm.  CT ABDOMEN AND PELVIS FINDINGS  Hepatobiliary: Contracted gallbladder.  Otherwise negative.  Pancreas: Unremarkable  Spleen: Old granulomatous disease of the spleen, stable.  Adrenals/Urinary Tract: 2.3 by 1.4 cm left adrenal adenoma, low-density.  Stomach/Bowel: Small hiatal hernia.  Vascular/Lymphatic: Aortoiliac atherosclerotic vascular disease. Long segment of low-density in the left common femoral vein, external iliac vein, and common iliac vein may  conceivably be due to venous contrast mixing but DVT is not excluded.  Reproductive: Unremarkable  Other: No supplemental non-categorized findings.  Musculoskeletal: Prior compression fractures at L1 and L3, and probably along the super endplate of X90, no change from prior. Lower lumbar degenerative facet arthropathy.  IMPRESSION: 1. Possible pelvic and left common femoral vein deep vein thrombosis. The appearance could alternatively be caused by venous mixing of contrast. Bilateral lower extremity Doppler venous ultrasound is recommended. 2. The nodular region adjacent to the volume loss in the left suprahilar region has reduced nodular conspicuity, currently 5 by 9 mm by my measurements, and previously 9 by 12 mm. 3. The chest appear otherwise stable, with ill-defined infiltration in the subcarinal region likely primarily from therapy, but possibly conceal even residual underlying malignancy. 4. Ancillary findings include left adrenal adenoma, old thoracolumbar compression fractures, a small hiatal hernia, and a small pericardial effusion. 5. Ectatic ascending thoracic aorta at 3.9 cm. Recommend annual imaging followup by CTA or MRA. This recommendation follows 2010 ACCF/AHA/AATS/ACR/ASA/SCA/SCAI/SIR/STS/SVM Guidelines for the Diagnosis and Management of Patients With Thoracic Aortic Disease. Circulation. 2010; 121: W409-B353   Electronically Signed   By: Sherryl Barters M.D.   On: 12/13/2014 16:16     EKG Interpretation None      Date: 12/13/2014  Rate:  110  Rhythm: sinus tachycardia  QRS Axis: normal  Intervals: normal  ST/T Wave abnormalities: normal  Conduction Disutrbances:none  Narrative Interpretation:   Old EKG Reviewed: none available MUSE system not functioning  Chest xray viewed by me Results for orders placed or performed during the hospital encounter of 12/13/14  CBC  Result Value Ref Range   WBC 6.0 4.0 - 10.5 K/uL   RBC 3.27 (L) 3.87 - 5.11 MIL/uL   Hemoglobin 10.1 (L) 12.0  - 15.0 g/dL   HCT 29.1 (L) 36.0 - 46.0 %   MCV 89.0 78.0 - 100.0 fL   MCH 30.9 26.0 - 34.0 pg   MCHC 34.7 30.0 - 36.0 g/dL   RDW 13.2 11.5 - 15.5 %   Platelets 217 150 - 400 K/uL  BNP (order ONLY if patient complains of dyspnea/SOB AND you have documented it for THIS visit)  Result Value Ref Range   Pro B Natriuretic peptide (BNP) 268.9 0 - 450 pg/mL  Comprehensive metabolic panel  Result Value Ref Range   Sodium 129 (L) 137 - 147 mEq/L   Potassium 4.0 3.7 - 5.3 mEq/L   Chloride 89 (L) 96 - 112 mEq/L   CO2 21 19 - 32 mEq/L   Glucose, Bld 112 (H) 70 - 99 mg/dL   BUN 16 6 - 23 mg/dL   Creatinine, Ser 0.77 0.50 - 1.10 mg/dL   Calcium 9.7 8.4 - 10.5 mg/dL   Total Protein 7.5 6.0 - 8.3 g/dL   Albumin 3.8 3.5 - 5.2 g/dL   AST 22 0 - 37 U/L   ALT 10 0 - 35 U/L   Alkaline Phosphatase 90 39 - 117 U/L   Total Bilirubin 0.5 0.3 - 1.2 mg/dL   GFR calc non Af Amer 79 (L) >90 mL/min   GFR calc Af Amer >90 >90 mL/min   Anion gap 19 (H) 5 - 15  I-stat troponin, ED (not at [facility])  Result Value Ref Range   Troponin i, poc 0.01 0.00 - 0.08 ng/mL   Comment 3           Ct Chest W Contrast  12/13/2014   CLINICAL DATA:  Lung cancer. Chemotherapy completed in August, 2015. Radiation therapy completed in October 2015. Prior right upper lobectomy. Mediastinal mass contained small cell cancer.  EXAM: CT CHEST, ABDOMEN, AND PELVIS WITH CONTRAST  TECHNIQUE: Multidetector CT imaging of the chest, abdomen and pelvis was performed following the standard protocol during bolus administration of intravenous contrast.  CONTRAST:  190mL OMNIPAQUE IOHEXOL 300 MG/ML  SOLN  COMPARISON:  09/11/2014  FINDINGS: CT CHEST FINDINGS  Indistinct loss of tissue planes in the subcarinal region. Difficult to measure due to the infiltrative nature of this process, grossly similar to prior. Right paratracheal node short axis diameter 5 mm on image 21 of series 2, stable. Stable small prevascular lymph nodes.  Small pericardial  effusion. Small hiatal hernia. Tortuous thoracic aorta with ascending thoracic aorta measuring 3.9 cm transverse. Coronary artery atherosclerosis.  Biapical pleural parenchymal scarring. Extensive centrilobular emphysema. Chronic interstitial accentuation diffusely. Perihilar scarring bilaterally, possible Mild paramediastinal radiation pneumonitis.  Left lower lobe pulmonary nodule 0.8 by 0.6 cm on image 49 of series 4, essentially stable from 2010. Prior nodularity in the left suprahilar region along an area of volume loss measures 9 by 5 mm (image 23/series 4), and by my measurements was previously about 9 by 13 mm.  CT ABDOMEN AND PELVIS FINDINGS  Hepatobiliary: Contracted gallbladder.  Otherwise negative.  Pancreas: Unremarkable  Spleen: Old granulomatous disease of the spleen, stable.  Adrenals/Urinary Tract: 2.3 by 1.4 cm left adrenal adenoma, low-density.  Stomach/Bowel: Small hiatal hernia.  Vascular/Lymphatic: Aortoiliac atherosclerotic vascular disease. Long segment of low-density in the left common femoral vein, external iliac vein, and common iliac vein may conceivably be due to venous contrast mixing but DVT is not excluded.  Reproductive: Unremarkable  Other: No supplemental non-categorized findings.  Musculoskeletal: Prior compression fractures at L1 and L3, and probably along the super endplate of Q00, no change from prior. Lower lumbar degenerative facet arthropathy.  IMPRESSION: 1. Possible pelvic and left common femoral vein deep vein thrombosis. The appearance could alternatively be caused by venous mixing of contrast. Bilateral lower extremity Doppler venous ultrasound is recommended. 2. The nodular region adjacent to the volume loss in the left suprahilar region has reduced nodular conspicuity, currently 5 by 9 mm by my measurements, and previously 9 by 12 mm. 3. The chest appear otherwise stable, with ill-defined infiltration in the subcarinal region likely primarily from therapy, but  possibly conceal even residual underlying malignancy. 4. Ancillary findings include left adrenal adenoma, old thoracolumbar compression fractures, a small hiatal hernia, and a small pericardial effusion. 5. Ectatic ascending thoracic aorta at 3.9 cm. Recommend annual imaging followup by CTA or MRA. This recommendation follows 2010 ACCF/AHA/AATS/ACR/ASA/SCA/SCAI/SIR/STS/SVM Guidelines for the Diagnosis and Management of Patients With Thoracic Aortic Disease. Circulation. 2010; 121: Q676-P950   Electronically Signed   By: Sherryl Barters M.D.   On: 12/13/2014 16:16   Dg Chest Port 1 View  12/13/2014   CLINICAL DATA:  Acute onset of shortness of breath. Personal history of smoking and lung cancer. Initial encounter.  EXAM: PORTABLE CHEST - 1 VIEW  COMPARISON:  Chest radiograph from 12/31/2013, and CT of the chest performed earlier today at 2:11 p.m.  FINDINGS: Diffuse interstitial prominence reflects chronic underlying lung disease, as noted on recent CT. The small nodule at the left lung base is not well characterized on radiograph. No pleural effusion or pneumothorax is seen.  The cardiomediastinal silhouette is borderline normal in size. A right-sided chest port is noted ending about the distal SVC. Postoperative change is noted overlying the right lung apex.  No acute osseous abnormalities are identified.  IMPRESSION: Chronic lung changes again noted. No acute superimposed airspace consolidation seen. The CT of the chest performed earlier today demonstrated no evidence of significant pulmonary embolus.   Electronically Signed   By: Garald Balding M.D.   On: 12/13/2014 21:46    MDM  Spoke with Dr.Niu is requesting CT angiogram of chest which we will check result of. Anemia is chrinic Plan admit, telemetry, Lovenox, oxygen Dx#1 acute dyspnea #2hypoxia #3 anemia #4 hyponatremia  Final diagnoses:  None        Orlie Dakin, MD 12/13/14 2333

## 2014-12-13 NOTE — ED Notes (Addendum)
Pt told to come to ED after CT scan today by Dr. Earlie Server. DVT found L femoral, pt c/o SHOB so CT was moved from later in December to today. Original sat in triage 89%, pt is not on 02 at home, quickly up to 98 on 2L. P120's. Chemo completed in August, 1st round of radiation completed in October

## 2014-12-13 NOTE — ED Notes (Signed)
Bed: NH65 Expected date:  Expected time:  Means of arrival:  Comments: Triage 1

## 2014-12-13 NOTE — H&P (Signed)
[facility] History and Physical  Frances Gonzalez WNI:[id number] DOB: 04-Feb-1935 DOA: 12/13/2014  Referring physician: ED physician PCP: [name], MD  Specialists:   Chief Complaint: SOB  HPI: Frances Gonzalez is a 79 y.o. female with past medical history of hypertension, GERD, hyperlipidemia, small cell lung cancer, who presents with shortness of breath.  Patient reports that she had shortness of breath when she was diagnosed as lung cancer last year. After she received chemotherapy, her shortness of breath has almost resolved. In the past 10 days, she started having shortness of breath. She has very minimal cough, no chest pain. No fever or chills. Her cancer doctor, Dr. Julien Nordmann ordered CT chest, which showed possible left femoral venous thrombosis. She comes to the hospital for further evaluation and treatment. She denies fever, chills, fatigue, headaches, chest pain, abdominal pain, diarrhea, constipation, dysuria, urgency, frequency, hematuria, skin rashes, joint pain or leg swelling.  Work up in the ED demonstrates proBNP 268, negative troponin, no leukocytosis. Patient is admitted to inpatient for further evaluation and treatment.  Review of Systems: As presented in the history of presenting illness, rest negative.  Where does patient live?  At home Can patient participate in ADLs? Yes  Allergy: No Known Allergies  Past Medical History  Diagnosis Date  . Hypertension   . Lung cancer dx'd 2010 & 2015    2 primary lung ca rul and mediastinum    Past Surgical History  Procedure Laterality Date  . Lung cancer surgery  02/24/04    RUL Dr Arlyce Dice    Social History:  reports that she quit smoking about 10 years ago. Her smoking use included Cigarettes. She has a 50 pack-year smoking history. She has never used smokeless tobacco. She reports that she does not drink alcohol or use illicit drugs.  Family History:  Family History  Problem Relation Age of Onset  .  Emphysema Maternal Grandmother     smoked  . Cancer Neg Hx      Prior to Admission medications   Medication Sig Start Date End Date Taking? Authorizing Provider  acetaminophen (TYLENOL) 500 MG tablet Take 1,000 mg by mouth every 6 (six) hours as needed for moderate pain.   Yes Historical Provider, MD  irbesartan (AVAPRO) 150 MG tablet Take 75 mg by mouth daily. Half tablet daily   Yes Historical Provider, MD  lidocaine-prilocaine (EMLA) cream Apply 1 application topically as needed. 04/30/14  Yes Curt Bears, MD  Multiple Vitamin (MULTIVITAMIN) tablet Take 1 tablet by mouth daily.   Yes Historical Provider, MD  pantoprazole (PROTONIX) 40 MG tablet Take 40 mg by mouth daily. 09/02/14  Yes Historical Provider, MD  simvastatin (ZOCOR) 20 MG tablet TAKE 1/2 TABLET BY MOUTH EVERY NIGHT AT BEDTIME 09/23/14  Yes Dorena Cookey, MD  ALPRAZolam Duanne Moron) 0.25 MG tablet Take 1 tablet (0.25 mg total) by mouth 2 (two) times daily as needed for anxiety (take 20 minutes before MRI). Patient not taking: Reported on 11/28/2014 09/23/14   Lora Paula, MD    Physical Exam: Filed Vitals:   12/13/14 2300 12/13/14 2330 12/14/14 0020 12/14/14 0047  BP: 96/50 117/79 118/98 140/80  Pulse: 110 117 103 117  Temp:   98.2 F (36.8 C) 98.3 F (36.8 C)  TempSrc:   Oral Oral  Resp: 17 24 16 18   Height:      Weight:      SpO2: 97% 96% 99% 93%   General: Not in acute distress HEENT:  Eyes: PERRL, EOMI, no scleral icterus       ENT: No discharge from the ears and nose, no pharynx injection, no tonsillar enlargement.        Neck: No JVD, no bruit, no mass felt. Cardiac: S1/S2, RRR, No murmurs, No gallops or rubs Pulm: There are fine crackles over right side of lump posteriorly. Abd: Soft, nondistended, nontender, no rebound pain, no organomegaly, BS present Ext: No edema bilaterally. 2+DP/PT pulse bilaterally Musculoskeletal: No joint deformities, erythema, or stiffness, ROM full Skin: No rashes.   Neuro: Alert and oriented X3, cranial nerves II-XII grossly intact, muscle strength 5/5 in all extremeties, sensation to light touch intact. Psych: Patient is not psychotic, no suicidal or hemocidal ideation.  Labs on Admission:  Basic Metabolic Panel:  Recent Labs Lab 12/13/14 1255 12/13/14 2058  NA 132* 129*  K 4.0 4.0  CL  --  89*  CO2 24 21  GLUCOSE 102 112*  BUN 18.6 16  CREATININE 0.9 0.77  CALCIUM 9.7 9.7   Liver Function Tests:  Recent Labs Lab 12/13/14 1255 12/13/14 2058  AST 22 22  ALT 12 10  ALKPHOS 88 90  BILITOT 0.55 0.5  PROT 7.4 7.5  ALBUMIN 3.7 3.8   No results for input(s): LIPASE, AMYLASE in the last 168 hours. No results for input(s): AMMONIA in the last 168 hours. CBC:  Recent Labs Lab 12/13/14 1255 12/13/14 2058  WBC 5.3 6.0  NEUTROABS 3.9  --   HGB 11.0* 10.1*  HCT 32.2* 29.1*  MCV 91.1 89.0  PLT 225 217   Cardiac Enzymes: No results for input(s): CKTOTAL, CKMB, CKMBINDEX, TROPONINI in the last 168 hours.  BNP (last 3 results)  Recent Labs  04/05/14 1705 12/13/14 2059  PROBNP 39.0 268.9   CBG: No results for input(s): GLUCAP in the last 168 hours.  Radiological Exams on Admission: Ct Chest W Contrast  12/13/2014   CLINICAL DATA:  Lung cancer. Chemotherapy completed in August, 2015. Radiation therapy completed in October 2015. Prior right upper lobectomy. Mediastinal mass contained small cell cancer.  EXAM: CT CHEST, ABDOMEN, AND PELVIS WITH CONTRAST  TECHNIQUE: Multidetector CT imaging of the chest, abdomen and pelvis was performed following the standard protocol during bolus administration of intravenous contrast.  CONTRAST:  132mL OMNIPAQUE IOHEXOL 300 MG/ML  SOLN  COMPARISON:  09/11/2014  FINDINGS: CT CHEST FINDINGS  Indistinct loss of tissue planes in the subcarinal region. Difficult to measure due to the infiltrative nature of this process, grossly similar to prior. Right paratracheal node short axis diameter 5 mm on image  21 of series 2, stable. Stable small prevascular lymph nodes.  Small pericardial effusion. Small hiatal hernia. Tortuous thoracic aorta with ascending thoracic aorta measuring 3.9 cm transverse. Coronary artery atherosclerosis.  Biapical pleural parenchymal scarring. Extensive centrilobular emphysema. Chronic interstitial accentuation diffusely. Perihilar scarring bilaterally, possible Mild paramediastinal radiation pneumonitis.  Left lower lobe pulmonary nodule 0.8 by 0.6 cm on image 49 of series 4, essentially stable from 2010. Prior nodularity in the left suprahilar region along an area of volume loss measures 9 by 5 mm (image 23/series 4), and by my measurements was previously about 9 by 13 mm.  CT ABDOMEN AND PELVIS FINDINGS  Hepatobiliary: Contracted gallbladder.  Otherwise negative.  Pancreas: Unremarkable  Spleen: Old granulomatous disease of the spleen, stable.  Adrenals/Urinary Tract: 2.3 by 1.4 cm left adrenal adenoma, low-density.  Stomach/Bowel: Small hiatal hernia.  Vascular/Lymphatic: Aortoiliac atherosclerotic vascular disease. Long segment of low-density in the left common  femoral vein, external iliac vein, and common iliac vein may conceivably be due to venous contrast mixing but DVT is not excluded.  Reproductive: Unremarkable  Other: No supplemental non-categorized findings.  Musculoskeletal: Prior compression fractures at L1 and L3, and probably along the super endplate of M54, no change from prior. Lower lumbar degenerative facet arthropathy.  IMPRESSION: 1. Possible pelvic and left common femoral vein deep vein thrombosis. The appearance could alternatively be caused by venous mixing of contrast. Bilateral lower extremity Doppler venous ultrasound is recommended. 2. The nodular region adjacent to the volume loss in the left suprahilar region has reduced nodular conspicuity, currently 5 by 9 mm by my measurements, and previously 9 by 12 mm. 3. The chest appear otherwise stable, with ill-defined  infiltration in the subcarinal region likely primarily from therapy, but possibly conceal even residual underlying malignancy. 4. Ancillary findings include left adrenal adenoma, old thoracolumbar compression fractures, a small hiatal hernia, and a small pericardial effusion. 5. Ectatic ascending thoracic aorta at 3.9 cm. Recommend annual imaging followup by CTA or MRA. This recommendation follows 2010 ACCF/AHA/AATS/ACR/ASA/SCA/SCAI/SIR/STS/SVM Guidelines for the Diagnosis and Management of Patients With Thoracic Aortic Disease. Circulation. 2010; 121: Y503-T465   Electronically Signed   By: Sherryl Barters M.D.   On: 12/13/2014 16:16   Ct Angio Chest Pe W/cm &/or Wo Cm  12/14/2014   CLINICAL DATA:  Acute onset of shortness of breath for 10 days. Shortness of breath is worsened with exertion. Initial encounter.  EXAM: CT ANGIOGRAPHY CHEST WITH CONTRAST  TECHNIQUE: Multidetector CT imaging of the chest was performed using the standard protocol during bolus administration of intravenous contrast. Multiplanar CT image reconstructions and MIPs were obtained to evaluate the vascular anatomy.  CONTRAST:  167mL OMNIPAQUE IOHEXOL 350 MG/ML SOLN  COMPARISON:  Chest radiograph performed earlier today at 9:18 p.m., and CT of the chest performed earlier today at 2:11 p.m.  FINDINGS: There is no evidence of pulmonary embolus.  Scattered peripheral scarring and interstitial prominence are noted, with emphysematous change most prominent at the lung apices. There is focal scarring at the right lung apex, with adjacent postoperative change. There is no evidence of significant focal consolidation, pleural effusion or pneumothorax. No masses are identified; no abnormal focal contrast enhancement is seen.  Hazy masslike density in the subcarinal region is similar in appearance to the recent prior study, and decreased in size from pre-radiation studies, though there is still some degree of mass effect on the left atrium and  pulmonary veins. No definite additional mediastinal lymphadenopathy is seen, though haziness extends anterior to the carina as on prior studies. No significant pericardial effusion is identified. The great vessels are grossly unremarkable.  A right-sided chest port is noted. No axillary lymphadenopathy is seen. The visualized portions of the thyroid gland are unremarkable in appearance.  A tiny hiatal hernia is noted. Diffuse calcified granulomata are seen within the spleen. The visualized portions of the liver and spleen are unremarkable. A 2.4 cm adrenal adenoma is noted at the left adrenal gland.  No acute osseous abnormalities are seen.  Review of the MIP images confirms the above findings.  IMPRESSION: 1. No evidence of pulmonary embolus. 2. Scattered peripheral scarring and interstitial prominence reflect chronic lung changes, with emphysema seen. Focal scarring at the right lung apex. 3. Stable appearance to hazy mass-like density in the subcarinal region, reflecting residual nodal prominence. This is decreased in size from pre-radiation studies, though there is still some degree of mass effect on the left  atrium and pulmonary veins. No additional mediastinal lymphadenopathy seen. 4. Tiny hiatal hernia noted. 5. 2.4 cm left adrenal adenoma noted.   Electronically Signed   By: Garald Balding M.D.   On: 12/14/2014 00:30   Dg Chest Port 1 View  12/13/2014   CLINICAL DATA:  Acute onset of shortness of breath. Personal history of smoking and lung cancer. Initial encounter.  EXAM: PORTABLE CHEST - 1 VIEW  COMPARISON:  Chest radiograph from 12/31/2013, and CT of the chest performed earlier today at 2:11 p.m.  FINDINGS: Diffuse interstitial prominence reflects chronic underlying lung disease, as noted on recent CT. The small nodule at the left lung base is not well characterized on radiograph. No pleural effusion or pneumothorax is seen.  The cardiomediastinal silhouette is borderline normal in size. A  right-sided chest port is noted ending about the distal SVC. Postoperative change is noted overlying the right lung apex.  No acute osseous abnormalities are identified.  IMPRESSION: Chronic lung changes again noted. No acute superimposed airspace consolidation seen. The CT of the chest performed earlier today demonstrated no evidence of significant pulmonary embolus.   Electronically Signed   By: Garald Balding M.D.   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 Patient does not have infiltration for pneumonia. Her lung cancer may have partially contributed. Given the findings of CT abdomen that he may DVT in the left femoral venous plus tachycardia, pulmonary embolism is likely though patient does not have chest pain. - will admit to tele bed - Lovenox was started in ED, will continue for both possible PE and DVT. - will get CTA - will leg LE venous Doppler, we'll discontinue Lovenox if both CTA and venous Doppler negative - NPO and IVF 125 cc/h  HLD: Last LDL was 69 on 01/16/13, no recent lipid profile. Patient is on Zocor at home. -Continue Zocor -Check fasting lipid profile.  Hypertension: Patient is on irbesartan at home. The [facility] hold her beside that given soft blood pressure on admission  Lung cancer: Patient was diagnosed as small cell lung cancer on 4/14. She has been followed up and treated by Dr. Julien Nordmann. She completed 6 cycles of chemotherapy on 8/15, and radiation therapy on 10/15. She has appointment with Dr. Julien Nordmann on 12/30/13. No acute issues of lung cancer. -Follow-up with Dr. Julien Nordmann at a discharge   DVT ppx: on IV Lovenox  Code Status: Full code Family Communication: None at bed side.   Disposition Plan: Admit to  inpatient   Date of Service 12/14/2014    Ivor Costa [facility] Pager [phone]  If 7PM-7AM, please contact night-coverage [email or web address] Password [email or web address] 12/14/2014, 3:07 AM

## 2014-12-14 ENCOUNTER — Encounter (HOSPITAL_COMMUNITY): Payer: Self-pay

## 2014-12-14 DIAGNOSIS — R0602 Shortness of breath: Secondary | ICD-10-CM

## 2014-12-14 LAB — COMPREHENSIVE METABOLIC PANEL
ALBUMIN: 3.4 g/dL — AB (ref 3.5–5.2)
ALK PHOS: 76 U/L (ref 39–117)
ALT: 9 U/L (ref 0–35)
AST: 21 U/L (ref 0–37)
Anion gap: 15 (ref 5–15)
BUN: 12 mg/dL (ref 6–23)
CO2: 22 mEq/L (ref 19–32)
Calcium: 9.1 mg/dL (ref 8.4–10.5)
Chloride: 95 mEq/L — ABNORMAL LOW (ref 96–112)
Creatinine, Ser: 0.7 mg/dL (ref 0.50–1.10)
GFR calc Af Amer: >90 mL/min (ref >90)
GFR calc non Af Amer: 81 mL/min — ABNORMAL LOW (ref >90)
Glucose, Bld: 98 mg/dL (ref 70–99)
POTASSIUM: 3.8 meq/L (ref 3.7–5.3)
Sodium: 132 mEq/L — ABNORMAL LOW (ref 137–147)
Total Bilirubin: 0.4 mg/dL (ref 0.3–1.2)
Total Protein: 6.8 g/dL (ref 6.0–8.3)

## 2014-12-14 LAB — PROTIME-INR
INR: 1.15 (ref 0.00–1.49)
Prothrombin Time: 14.9 seconds (ref 11.6–15.2)

## 2014-12-14 LAB — LIPID PANEL
CHOLESTEROL: 120 mg/dL (ref 0–200)
HDL: 45 mg/dL (ref >39)
LDL CALC: 54 mg/dL (ref 0–99)
Total CHOL/HDL Ratio: 2.7 RATIO
Triglycerides: 107 mg/dL (ref <150)
VLDL: 21 mg/dL (ref 0–40)

## 2014-12-14 LAB — CBC
HEMATOCRIT: 28.2 % — AB (ref 36.0–46.0)
HEMOGLOBIN: 9.8 g/dL — AB (ref 12.0–15.0)
MCH: 31.3 pg (ref 26.0–34.0)
MCHC: 34.8 g/dL (ref 30.0–36.0)
MCV: 90.1 fL (ref 78.0–100.0)
Platelets: 198 10*3/uL (ref 150–400)
RBC: 3.13 MIL/uL — ABNORMAL LOW (ref 3.87–5.11)
RDW: 13.3 % (ref 11.5–15.5)
WBC: 3.8 10*3/uL — ABNORMAL LOW (ref 4.0–10.5)

## 2014-12-14 LAB — GLUCOSE, CAPILLARY: Glucose-Capillary: 96 mg/dL (ref 70–99)

## 2014-12-14 MED ORDER — SODIUM CHLORIDE 0.9 % IJ SOLN
10.0000 mL | INTRAMUSCULAR | Status: DC | PRN
  Administered 2014-12-14: 10 mL

## 2014-12-14 MED ORDER — PREDNISONE 20 MG PO TABS: 20.0000 mg | ORAL_TABLET | Freq: Every day | ORAL | Status: DC

## 2014-12-14 MED ORDER — SODIUM CHLORIDE 0.9 % IJ SOLN: 10.0000 mL | Freq: Two times a day (BID) | INTRAMUSCULAR | Status: DC

## 2014-12-14 MED ORDER — SIMVASTATIN 10 MG PO TABS
10.0000 mg | ORAL_TABLET | Freq: Every day | ORAL | Status: DC
  Administered 2014-12-14: 10 mg via ORAL
  Filled 2014-12-14: qty 1

## 2014-12-14 MED ORDER — LIDOCAINE-PRILOCAINE 2.5-2.5 % EX CREA
1.0000 "application " | TOPICAL_CREAM | CUTANEOUS | Status: DC | PRN
  Filled 2014-12-14: qty 5

## 2014-12-14 MED ORDER — ALPRAZOLAM 0.25 MG PO TABS
0.2500 mg | ORAL_TABLET | Freq: Two times a day (BID) | ORAL | Status: DC | PRN
Start: 2014-12-14 — End: 2014-12-14

## 2014-12-14 MED ORDER — ENOXAPARIN SODIUM 60 MG/0.6ML ~~LOC~~ SOLN
60.0000 mg | Freq: Two times a day (BID) | SUBCUTANEOUS | Status: DC
  Administered 2014-12-14: 60 mg via SUBCUTANEOUS
  Filled 2014-12-14 (×2): qty 0.6

## 2014-12-14 MED ORDER — PANTOPRAZOLE SODIUM 40 MG PO TBEC
40.0000 mg | DELAYED_RELEASE_TABLET | Freq: Every day | ORAL | Status: DC
  Administered 2014-12-14: 40 mg via ORAL
  Filled 2014-12-14: qty 1

## 2014-12-14 MED ORDER — ACETAMINOPHEN 500 MG PO TABS: 1000.0000 mg | ORAL_TABLET | Freq: Three times a day (TID) | ORAL | Status: DC | PRN

## 2014-12-14 MED ORDER — ADULT MULTIVITAMIN W/MINERALS CH
1.0000 | ORAL_TABLET | Freq: Every day | ORAL | Status: DC
  Administered 2014-12-14: 1 via ORAL
  Filled 2014-12-14: qty 1

## 2014-12-14 MED ORDER — SODIUM CHLORIDE 0.9 % IJ SOLN
3.0000 mL | Freq: Two times a day (BID) | INTRAMUSCULAR | Status: DC
  Administered 2014-12-14: 3 mL via INTRAVENOUS

## 2014-12-14 MED ORDER — HEPARIN SOD (PORK) LOCK FLUSH 100 UNIT/ML IV SOLN
500.0000 [IU] | INTRAVENOUS | Status: AC | PRN
  Administered 2014-12-14: 500 [IU]

## 2014-12-14 MED ORDER — ALBUTEROL SULFATE HFA 108 (90 BASE) MCG/ACT IN AERS: 2.0000 | INHALATION_SPRAY | Freq: Four times a day (QID) | RESPIRATORY_TRACT | Status: DC | PRN

## 2014-12-14 MED ORDER — IOHEXOL 350 MG/ML SOLN
100.0000 mL | Freq: Once | INTRAVENOUS | Status: AC | PRN
  Administered 2014-12-14: 100 mL via INTRAVENOUS

## 2014-12-14 NOTE — Progress Notes (Signed)
PT Cancellation Note  Patient Details Name: Frances Gonzalez MRN: [id number] DOB: 10-08-1935   Cancelled Treatment:    Reason Eval/Treat Not Completed: PT screened, no needs identified, will sign off Pt reports no PT needs at this time and plans to d/c home when she receives paperwork.   Daimian Sudberry,KATHrine E 12/14/2014, 3:09 PM Carmelia Bake, PT, DPT 12/14/2014 Pager: [phone]

## 2014-12-14 NOTE — Progress Notes (Signed)
Pt left at this time with her sister. Pt alert, oriented, and without c/o. Discharge instructions/prescriptions given/explained with pt verbalizing understanding. Followup appointment noted.

## 2014-12-14 NOTE — Progress Notes (Signed)
VASCULAR LAB PRELIMINARY  PRELIMINARY  PRELIMINARY  PRELIMINARY  Bilateral lower extremity venous Dopplers completed.    Preliminary report:  There is no DVT or SVT noted in the bilateral lower extremities.   Tylar Amborn, RVT 12/14/2014, 11:39 AM

## 2014-12-14 NOTE — Discharge Summary (Signed)
Physician Discharge Summary  Frances Gonzalez [facility] IEP:[id number] DOB: 01/11/1935 DOA: 12/13/2014  PCP: [name], MD  Admit date: 12/13/2014 Discharge date: 12/14/2014  Time spent: 25 minutes  Recommendations for Outpatient Follow-up:  1. 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 Julien Nordmann (status post 6 cycles of chemotherapy with residual mediastinal disease and status post radiation therapy) presented with shortness of breath for the past 10 days worsened with exertion. She reports minimal cough without any chest pain. Denies any fever or chills. Her oncologist have ordered CT scan of the chest abdomen and pelvis which showed possible left femoral vein thrombosis and thus was sent to the hospital for further evaluation and treatment. Workup in the ED showed normal proBNP, negative troponins and normal CBC. CT angiogram of the chest was negative for PE. Patient admitted for further evaluation.  Hospital Course:  Shortness of breath Likely a combination of underlying lung cancer and possible radiation pneumonitis. CT angiogram of the chest negative for PE. There is also extensive centrilobular emphysema seen. Symptoms are better at present and maintaining O2 sat on room air. No signs of infection on imaging. I will prescribe her a tapering dose of prednisone to cover for possible radiation pneumonitis. Will also prescribe her albuterol inhaler. She will follow up with her PCP and oncologist as outpatient.  ? Possible  femoral vein thrombosis seen on CT scan Doppler of her lower extremities done this morning which was negative for DVTs. Discontinued anticoagulation.  Small cell lung cancer Completed 6 cycles of chemotherapy and 14 cycles of radiation. Follow with Dr. Julien Nordmann as outpatient  Hypertension Resume home medications  Patient is clinically stable and wants to go home. She is stable for discharge. She will follow-up with her PCP and oncologist as outpatient.  Procedures:  CT angiogram of chest  Doppler lower extremities  Consultations:  None  Discharge Exam: Filed Vitals:   12/14/14 0519  BP: 115/68  Pulse: 97  Temp: 98.6 F (37 C)  Resp: 18    General: Elderly female in no acute distress HEENT: No pallor, moist oral mucosa Chest: Fine right basilar crackles, no rhonchi or wheeze Cardiovascular: Normal S1 and S2, no murmurs Abdomen: Soft, nondistended, nontender, bowel sounds present Extremities: Warm, no edema  Discharge Instructions You were cared for by a hospitalist during your hospital stay. If you have any questions about your discharge medications or the care you received while you were in the hospital after you are discharged, you can call the unit and asked to speak with the hospitalist on call if the hospitalist that took care of you is not available. Once you are discharged, your primary care physician will handle any further medical issues. Please note that NO REFILLS for any discharge medications will be authorized once you are discharged, as it is imperative that you return to your primary care physician (or establish a relationship with a primary care physician if you do not have one) for your aftercare needs so that they can reassess your need for medications and  monitor your lab values.   Current Discharge Medication List    START taking these medications   Details  predniSONE (DELTASONE) 20 MG tablet Take 1 tablet (20 mg total) by mouth daily with  breakfast. Qty: 38 tablet, Refills: 0          Taper dose: 60 mg daily for 5 days, followed by 40 mg daily for next 5 days, then 30 mg daily for next 5 days and then 20 mg daily for next 5 days and then stop.   Albuterol inhaler 108 (90 base) MCG/ACT inhaler    inhale 2 puffs into the lungs every 6 hours as needed for wheezing or shortness of breath     CONTINUE these medications which have NOT CHANGED   Details  acetaminophen (TYLENOL) 500 MG tablet Take 1,000 mg by mouth every 6 (six) hours as needed for moderate pain.    irbesartan (AVAPRO) 150 MG tablet Take 75 mg by mouth daily. Half tablet daily    lidocaine-prilocaine (EMLA) cream Apply 1 application topically as needed. Qty: 30 g, Refills: 0    Multiple Vitamin (MULTIVITAMIN) tablet Take 1 tablet by mouth daily.   Associated Diagnoses: Small cell lung carcinoma, unspecified laterality    pantoprazole (PROTONIX) 40 MG tablet Take 40 mg by mouth daily.   Associated Diagnoses: Small cell lung carcinoma, unspecified laterality    simvastatin (ZOCOR) 20 MG tablet TAKE 1/2 TABLET BY MOUTH EVERY NIGHT AT BEDTIME Qty: 100 tablet, Refills: 2    ALPRAZolam (XANAX) 0.25 MG tablet Take 1 tablet (0.25 mg total) by mouth 2 (two) times daily as needed for anxiety (take 20 minutes before MRI). Qty: 30 tablet, Refills: 0   Associated Diagnoses: Small cell lung carcinoma, unspecified laterality       No Known Allergies Follow-up Information    Follow up with TODD,JEFFREY ALLEN, MD. Schedule an appointment as soon as possible for a visit in 1 week.   Specialty:  Family Medicine   Contact information:   [place] [address] [phone]        The results of significant diagnostics from this hospitalization (including imaging, microbiology, ancillary and laboratory) are listed below for reference.    Significant Diagnostic Studies: Ct Chest W Contrast  12/13/2014   CLINICAL DATA:  Lung cancer. Chemotherapy  completed in August, 2015. Radiation therapy completed in October 2015. Prior right upper lobectomy. Mediastinal mass contained small cell cancer.  EXAM: CT CHEST, ABDOMEN, AND PELVIS WITH CONTRAST  TECHNIQUE: Multidetector CT imaging of the chest, abdomen and pelvis was performed following the standard protocol during bolus administration of intravenous contrast.  CONTRAST:  180mL OMNIPAQUE IOHEXOL 300 MG/ML  SOLN  COMPARISON:  09/11/2014  FINDINGS: CT CHEST FINDINGS  Indistinct loss of tissue planes in the subcarinal region. Difficult to measure due to the infiltrative nature of this process, grossly similar to prior. Right paratracheal node short axis diameter 5 mm on image 21 of series 2, stable. Stable small prevascular lymph nodes.  Small pericardial effusion. Small hiatal hernia. Tortuous thoracic aorta with ascending thoracic aorta measuring 3.9 cm transverse. Coronary artery atherosclerosis.  Biapical pleural parenchymal scarring. Extensive centrilobular emphysema. Chronic interstitial accentuation diffusely. Perihilar scarring bilaterally, possible Mild paramediastinal radiation pneumonitis.  Left lower lobe pulmonary nodule 0.8 by 0.6 cm on image 49 of series 4, essentially stable from 2010. Prior nodularity in the left suprahilar region along an area of volume loss measures 9 by 5 mm (image 23/series 4), and by my  measurements was previously about 9 by 13 mm.  CT ABDOMEN AND PELVIS FINDINGS  Hepatobiliary: Contracted gallbladder.  Otherwise negative.  Pancreas: Unremarkable  Spleen: Old granulomatous disease of the spleen, stable.  Adrenals/Urinary Tract: 2.3 by 1.4 cm left adrenal adenoma, low-density.  Stomach/Bowel: Small hiatal hernia.  Vascular/Lymphatic: Aortoiliac atherosclerotic vascular disease. Long segment of low-density in the left common femoral vein, external iliac vein, and common iliac vein may conceivably be due to venous contrast mixing but DVT is not excluded.  Reproductive:  Unremarkable  Other: No supplemental non-categorized findings.  Musculoskeletal: Prior compression fractures at L1 and L3, and probably along the super endplate of B44, no change from prior. Lower lumbar degenerative facet arthropathy.  IMPRESSION: 1. Possible pelvic and left common femoral vein deep vein thrombosis. The appearance could alternatively be caused by venous mixing of contrast. Bilateral lower extremity Doppler venous ultrasound is recommended. 2. The nodular region adjacent to the volume loss in the left suprahilar region has reduced nodular conspicuity, currently 5 by 9 mm by my measurements, and previously 9 by 12 mm. 3. The chest appear otherwise stable, with ill-defined infiltration in the subcarinal region likely primarily from therapy, but possibly conceal even residual underlying malignancy. 4. Ancillary findings include left adrenal adenoma, old thoracolumbar compression fractures, a small hiatal hernia, and a small pericardial effusion. 5. Ectatic ascending thoracic aorta at 3.9 cm. Recommend annual imaging followup by CTA or MRA. This recommendation follows 2010 ACCF/AHA/AATS/ACR/ASA/SCA/SCAI/SIR/STS/SVM Guidelines for the Diagnosis and Management of Patients With Thoracic Aortic Disease. Circulation. 2010; 121: H675-F163   Electronically Signed   By: Sherryl Barters M.D.   On: 12/13/2014 16:16   Ct Angio Chest Pe W/cm &/or Wo Cm  12/14/2014   CLINICAL DATA:  Acute onset of shortness of breath for 10 days. Shortness of breath is worsened with exertion. Initial encounter.  EXAM: CT ANGIOGRAPHY CHEST WITH CONTRAST  TECHNIQUE: Multidetector CT imaging of the chest was performed using the standard protocol during bolus administration of intravenous contrast. Multiplanar CT image reconstructions and MIPs were obtained to evaluate the vascular anatomy.  CONTRAST:  130mL OMNIPAQUE IOHEXOL 350 MG/ML SOLN  COMPARISON:  Chest radiograph performed earlier today at 9:18 p.m., and CT of the chest  performed earlier today at 2:11 p.m.  FINDINGS: There is no evidence of pulmonary embolus.  Scattered peripheral scarring and interstitial prominence are noted, with emphysematous change most prominent at the lung apices. There is focal scarring at the right lung apex, with adjacent postoperative change. There is no evidence of significant focal consolidation, pleural effusion or pneumothorax. No masses are identified; no abnormal focal contrast enhancement is seen.  Hazy masslike density in the subcarinal region is similar in appearance to the recent prior study, and decreased in size from pre-radiation studies, though there is still some degree of mass effect on the left atrium and pulmonary veins. No definite additional mediastinal lymphadenopathy is seen, though haziness extends anterior to the carina as on prior studies. No significant pericardial effusion is identified. The great vessels are grossly unremarkable.  A right-sided chest port is noted. No axillary lymphadenopathy is seen. The visualized portions of the thyroid gland are unremarkable in appearance.  A tiny hiatal hernia is noted. Diffuse calcified granulomata are seen within the spleen. The visualized portions of the liver and spleen are unremarkable. A 2.4 cm adrenal adenoma is noted at the left adrenal gland.  No acute osseous abnormalities are seen.  Review of the MIP images confirms the above findings.  IMPRESSION: 1. No evidence of pulmonary embolus. 2. Scattered peripheral scarring and interstitial prominence reflect chronic lung changes, with emphysema seen. Focal scarring at the right lung apex. 3. Stable appearance to hazy mass-like density in the subcarinal region, reflecting residual nodal prominence. This is decreased in size from pre-radiation studies, though there is still some degree of mass effect on the left atrium and pulmonary veins. No additional mediastinal lymphadenopathy seen. 4. Tiny hiatal hernia noted. 5. 2.4 cm left  adrenal adenoma noted.   Electronically Signed   By: Garald Balding M.D.   On: 12/14/2014 00:30   Dg Chest Port 1 View  12/13/2014   CLINICAL DATA:  Acute onset of shortness of breath. Personal history of smoking and lung cancer. Initial encounter.  EXAM: PORTABLE CHEST - 1 VIEW  COMPARISON:  Chest radiograph from 12/31/2013, and CT of the chest performed earlier today at 2:11 p.m.  FINDINGS: Diffuse interstitial prominence reflects chronic underlying lung disease, as noted on recent CT. The small nodule at the left lung base is not well characterized on radiograph. No pleural effusion or pneumothorax is seen.  The cardiomediastinal silhouette is borderline normal in size. A right-sided chest port is noted ending about the distal SVC. Postoperative change is noted overlying the right lung apex.  No acute osseous abnormalities are identified.  IMPRESSION: Chronic lung changes again noted. No acute superimposed airspace consolidation seen. The CT of the chest performed earlier today demonstrated no evidence of significant pulmonary embolus.   Electronically Signed   By: Garald Balding M.D.   On: 12/13/2014 21:46    Microbiology: No results found for this or any previous visit (from the past 240 hour(s)).   Labs: Basic Metabolic Panel:  Recent Labs Lab 12/13/14 1255 12/13/14 2058 12/14/14 0435  NA 132* 129* 132*  K 4.0 4.0 3.8  CL  --  89* 95*  CO2 24 21 22   GLUCOSE 102 112* 98  BUN 18.6 16 12   CREATININE 0.9 0.77 0.70  CALCIUM 9.7 9.7 9.1   Liver Function Tests:  Recent Labs Lab 12/13/14 1255 12/13/14 2058 12/14/14 0435  AST 22 22 21   ALT 12 10 9   ALKPHOS 88 90 76  BILITOT 0.55 0.5 0.4  PROT 7.4 7.5 6.8  ALBUMIN 3.7 3.8 3.4*   No results for input(s): LIPASE, AMYLASE in the last 168 hours. No results for input(s): AMMONIA in the last 168 hours. CBC:  Recent Labs Lab 12/13/14 1255 12/13/14 2058 12/14/14 0435  WBC 5.3 6.0 3.8*  NEUTROABS 3.9  --   --   HGB 11.0* 10.1*  9.8*  HCT 32.2* 29.1* 28.2*  MCV 91.1 89.0 90.1  PLT 225 217 198   Cardiac Enzymes: No results for input(s): CKTOTAL, CKMB, CKMBINDEX, TROPONINI in the last 168 hours. BNP: BNP (last 3 results)  Recent Labs  04/05/14 1705 12/13/14 2059  PROBNP 39.0 268.9   CBG:  Recent Labs Lab 12/14/14 0741  GLUCAP 96       Signed:  Loren Vicens  [facility] 12/14/2014, 2:37 PM

## 2014-12-14 NOTE — Progress Notes (Signed)
ANTICOAGULATION CONSULT NOTE - Initial Consult  Pharmacy Consult for Lovenox Indication: + DVT, r/o PE  No Known Allergies  Patient Measurements: Height: 5\' 1"  (154.9 cm) Weight: 135 lb (61.236 kg) IBW/kg (Calculated) : 47.8  Vital Signs: Temp: 98.3 F (36.8 C) (12/19 0047) Temp Source: Oral (12/19 0047) BP: 140/80 mmHg (12/19 0047) Pulse Rate: 117 (12/19 0047)  Labs:  Recent Labs  12/13/14 1255 12/13/14 1255 12/13/14 2058  HGB 11.0*  --  10.1*  HCT 32.2*  --  29.1*  PLT 225  --  217  CREATININE  --  0.9 0.77    Estimated Creatinine Clearance: 49.5 mL/min (by C-G formula based on Cr of 0.77).   Medical History: Past Medical History  Diagnosis Date  . Hypertension   . Lung cancer dx'd 2010 & 2015    2 primary lung ca rul and mediastinum    Medications:  Scheduled:  . enoxaparin (LOVENOX) injection  60 mg Subcutaneous Q12H  . multivitamin  1 tablet Oral Daily  . pantoprazole  40 mg Oral Daily  . simvastatin  10 mg Oral Daily  . sodium chloride  3 mL Intravenous Q12H   Infusions:  . sodium chloride 125 mL/hr (12/13/14 2330)    Assessment:  79 yr female with c/o SOB earlier today.  CT scan shows + DVT  Patient with h/o Lung cancer  Received Lovenox 60mg  sq x 1 in ED @ 23:22 on 12/18  CT Angio ordered  Pharmacy consulted to continue dosing of Lovenox  Goal of Therapy:   Monitor platelets by anticoagulation protocol: Yes   Plan:   Lovenox 60mg  sq q12h  Follow renal function and CBC  Aanchal Cope, Toribio Harbour, PharmD 12/14/2014,1:10 AM

## 2014-12-17 ENCOUNTER — Telehealth: Payer: Self-pay | Admitting: *Deleted

## 2014-12-17 NOTE — Telephone Encounter (Signed)
Per Dr Vista Mink, CT scan reviewed, ok to keep f/u with [facility] as scheduled 12/30/14.  Left msg and informed pt.

## 2014-12-23 ENCOUNTER — Other Ambulatory Visit: Payer: Medicare Other

## 2014-12-23 ENCOUNTER — Ambulatory Visit (HOSPITAL_COMMUNITY): Payer: Medicare Other

## 2014-12-30 ENCOUNTER — Telehealth: Payer: Self-pay | Admitting: Internal Medicine

## 2014-12-30 ENCOUNTER — Encounter: Payer: Self-pay | Admitting: Internal Medicine

## 2014-12-30 ENCOUNTER — Ambulatory Visit (HOSPITAL_BASED_OUTPATIENT_CLINIC_OR_DEPARTMENT_OTHER): Payer: Medicare Other | Admitting: Internal Medicine

## 2014-12-30 VITALS — BP 141/82 | HR 124 | Temp 97.7°F | Resp 21 | Ht 61.0 in | Wt 142.4 lb

## 2014-12-30 DIAGNOSIS — R0602 Shortness of breath: Secondary | ICD-10-CM

## 2014-12-30 DIAGNOSIS — C342 Malignant neoplasm of middle lobe, bronchus or lung: Secondary | ICD-10-CM

## 2014-12-30 DIAGNOSIS — J449 Chronic obstructive pulmonary disease, unspecified: Secondary | ICD-10-CM

## 2014-12-30 DIAGNOSIS — C7A1 Malignant poorly differentiated neuroendocrine tumors: Secondary | ICD-10-CM

## 2014-12-30 DIAGNOSIS — D649 Anemia, unspecified: Secondary | ICD-10-CM

## 2014-12-30 DIAGNOSIS — D630 Anemia in neoplastic disease: Secondary | ICD-10-CM | POA: Insufficient documentation

## 2014-12-30 NOTE — Telephone Encounter (Signed)
gv and printed appt sched and avs for pt for April 2016....gv pt barium

## 2014-12-30 NOTE — Progress Notes (Signed)
[place] [phone]   [phone]  OFFICE PROGRESS NOTE  Frances Man, MD [place] [address]  DIAGNOSIS: Extensive stage small cell lung cancer presenting with large mediastinal mass with bilateral mediastinal lymphadenopathy as well as bilateral pulmonary nodules and supraclavicular lymph nodes in addition to questionable malignant pericardial effusion diagnosed in April of 2015.  PRIOR THERAPY:  1) Systemic chemotherapy with carboplatin for AUC of 5 on day 1 and etoposide 120 mg/M2 on days 1, 2 and 3 with Neulasta support on day 4. Status post 6 cycles with partial response. 2) status post palliative radiotherapy to the residual mediastinal disease under the care of Dr. Tammi Klippel completed 10/24/2014  CURRENT THERAPY: Observation.  INTERVAL HISTORY: Frances Gonzalez 80 y.o. female returns to the clinic today for followup visit accompanied by her sister. The patient is feeling fine today with no specific complaints except for shortness of breath at baseline and increased with exertion. She was recently admitted to [facility] with shortness of breath and questionable deep venous thrombosis but CT angiogram of the chest showed no evidence for pulmonary embolism and the lower extremity Dopplers were negative for DVT thrombosis. She was treated with antibiotic and taper dose of prednisone. She is currently on prednisone 10 mg by mouth daily. She completed a course of palliative radiotherapy to the residual disease in the mediastinum under the care of Dr. Tammi Klippel in October 2015. She denied having any significant fever or chills, no nausea or vomiting. She denied having any significant chest pain, cough or hemoptysis. She has no weight loss or night sweats. She had repeat CT scan of the chest, abdomen and pelvis performed recently and she is here for evaluation and discussion of her scan results.    MEDICAL  HISTORY: Past Medical History  Diagnosis Date  . Hypertension   . Lung cancer dx'd 2010 & 2015    2 primary lung ca rul and mediastinum    ALLERGIES:  has No Known Allergies.  MEDICATIONS:  Current Outpatient Prescriptions  Medication Sig Dispense Refill  . irbesartan (AVAPRO) 150 MG tablet Take 75 mg by mouth daily. Half tablet daily    . Multiple Vitamin (MULTIVITAMIN) tablet Take 1 tablet by mouth daily.    . pantoprazole (PROTONIX) 40 MG tablet Take 40 mg by mouth daily.    . predniSONE (DELTASONE) 20 MG tablet Take 1 tablet (20 mg total) by mouth daily with breakfast. 38 tablet 0  . simvastatin (ZOCOR) 20 MG tablet TAKE 1/2 TABLET BY MOUTH EVERY NIGHT AT BEDTIME 100 tablet 2  . albuterol (PROVENTIL HFA;VENTOLIN HFA) 108 (90 BASE) MCG/ACT inhaler Inhale 2 puffs into the lungs every 6 (six) hours as needed for wheezing or shortness of breath. (Patient not taking: Reported on 12/30/2014) 1 Inhaler 2  . ALPRAZolam (XANAX) 0.25 MG tablet Take 1 tablet (0.25 mg total) by mouth 2 (two) times daily as needed for anxiety (take 20 minutes before MRI). (Patient not taking: Reported on 11/28/2014) 30 tablet 0  . lidocaine-prilocaine (EMLA) cream Apply 1 application topically as needed. (Patient not taking: Reported on 12/30/2014) 30 g 0   No current facility-administered medications for this visit.    SURGICAL HISTORY:  Past Surgical History  Procedure Laterality Date  . Lung cancer surgery  02/24/04    RUL Dr Arlyce Dice    REVIEW OF SYSTEMS:  Constitutional: positive for fatigue Eyes: negative Ears, nose, mouth, throat, and face: negative Respiratory:  positive for dyspnea on exertion Cardiovascular: negative Gastrointestinal: negative Genitourinary:negative Integument/breast: negative Hematologic/lymphatic: negative Musculoskeletal:negative Neurological: negative Behavioral/Psych: negative Endocrine: negative Allergic/Immunologic: negative   PHYSICAL EXAMINATION: General appearance:  alert, cooperative and no distress Head: Normocephalic, without obvious abnormality, atraumatic Neck: no adenopathy, no JVD, supple, symmetrical, trachea midline and thyroid not enlarged, symmetric, no tenderness/mass/nodules Lymph nodes: Cervical, supraclavicular, and axillary nodes normal. Resp: clear to auscultation bilaterally Back: symmetric, no curvature. ROM normal. No CVA tenderness. Cardio: regular rate and rhythm, S1, S2 normal, no murmur, click, rub or gallop GI: soft, non-tender; bowel sounds normal; no masses,  no organomegaly Extremities: extremities normal, atraumatic, no cyanosis or edema Neurologic: Alert and oriented X 3, normal strength and tone. Normal symmetric reflexes. Normal coordination and gait  ECOG PERFORMANCE STATUS: 1 - Symptomatic but completely ambulatory  Blood pressure 141/82, pulse 124, temperature 97.7 F (36.5 C), temperature source Oral, resp. rate 21, height 5\' 1"  (1.549 m), weight 142 lb 6.4 oz (64.592 kg), SpO2 91 %.  LABORATORY DATA: Lab Results  Component Value Date   WBC 3.8* 12/14/2014   HGB 9.8* 12/14/2014   HCT 28.2* 12/14/2014   MCV 90.1 12/14/2014   PLT 198 12/14/2014      Chemistry      Component Value Date/Time   NA 132* 12/14/2014 0435   NA 132* 12/13/2014 1255   K 3.8 12/14/2014 0435   K 4.0 12/13/2014 1255   CL 95* 12/14/2014 0435   CO2 22 12/14/2014 0435   CO2 24 12/13/2014 1255   BUN 12 12/14/2014 0435   BUN 18.6 12/13/2014 1255   CREATININE 0.70 12/14/2014 0435   CREATININE 0.9 12/13/2014 1255      Component Value Date/Time   CALCIUM 9.1 12/14/2014 0435   CALCIUM 9.7 12/13/2014 1255   ALKPHOS 76 12/14/2014 0435   ALKPHOS 88 12/13/2014 1255   AST 21 12/14/2014 0435   AST 22 12/13/2014 1255   ALT 9 12/14/2014 0435   ALT 12 12/13/2014 1255   BILITOT 0.4 12/14/2014 0435   BILITOT 0.55 12/13/2014 1255       RADIOGRAPHIC STUDIES: Ct Chest W Contrast  12/13/2014   CLINICAL DATA:  Lung cancer. Chemotherapy  completed in August, 2015. Radiation therapy completed in October 2015. Prior right upper lobectomy. Mediastinal mass contained small cell cancer.  EXAM: CT CHEST, ABDOMEN, AND PELVIS WITH CONTRAST  TECHNIQUE: Multidetector CT imaging of the chest, abdomen and pelvis was performed following the standard protocol during bolus administration of intravenous contrast.  CONTRAST:  19mL OMNIPAQUE IOHEXOL 300 MG/ML  SOLN  COMPARISON:  09/11/2014  FINDINGS: CT CHEST FINDINGS  Indistinct loss of tissue planes in the subcarinal region. Difficult to measure due to the infiltrative nature of this process, grossly similar to prior. Right paratracheal node short axis diameter 5 mm on image 21 of series 2, stable. Stable small prevascular lymph nodes.  Small pericardial effusion. Small hiatal hernia. Tortuous thoracic aorta with ascending thoracic aorta measuring 3.9 cm transverse. Coronary artery atherosclerosis.  Biapical pleural parenchymal scarring. Extensive centrilobular emphysema. Chronic interstitial accentuation diffusely. Perihilar scarring bilaterally, possible Mild paramediastinal radiation pneumonitis.  Left lower lobe pulmonary nodule 0.8 by 0.6 cm on image 49 of series 4, essentially stable from 2010. Prior nodularity in the left suprahilar region along an area of volume loss measures 9 by 5 mm (image 23/series 4), and by my measurements was previously about 9 by 13 mm.  CT ABDOMEN AND PELVIS FINDINGS  Hepatobiliary: Contracted gallbladder.  Otherwise negative.  Pancreas:  Unremarkable  Spleen: Old granulomatous disease of the spleen, stable.  Adrenals/Urinary Tract: 2.3 by 1.4 cm left adrenal adenoma, low-density.  Stomach/Bowel: Small hiatal hernia.  Vascular/Lymphatic: Aortoiliac atherosclerotic vascular disease. Long segment of low-density in the left common femoral vein, external iliac vein, and common iliac vein may conceivably be due to venous contrast mixing but DVT is not excluded.  Reproductive:  Unremarkable  Other: No supplemental non-categorized findings.  Musculoskeletal: Prior compression fractures at L1 and L3, and probably along the super endplate of V03, no change from prior. Lower lumbar degenerative facet arthropathy.  IMPRESSION: 1. Possible pelvic and left common femoral vein deep vein thrombosis. The appearance could alternatively be caused by venous mixing of contrast. Bilateral lower extremity Doppler venous ultrasound is recommended. 2. The nodular region adjacent to the volume loss in the left suprahilar region has reduced nodular conspicuity, currently 5 by 9 mm by my measurements, and previously 9 by 12 mm. 3. The chest appear otherwise stable, with ill-defined infiltration in the subcarinal region likely primarily from therapy, but possibly conceal even residual underlying malignancy. 4. Ancillary findings include left adrenal adenoma, old thoracolumbar compression fractures, a small hiatal hernia, and a small pericardial effusion. 5. Ectatic ascending thoracic aorta at 3.9 cm. Recommend annual imaging followup by CTA or MRA. This recommendation follows 2010 ACCF/AHA/AATS/ACR/ASA/SCA/SCAI/SIR/STS/SVM Guidelines for the Diagnosis and Management of Patients With Thoracic Aortic Disease. Circulation. 2010; 121: [id number]   Electronically Signed   By: Sherryl Barters M.D.   On: 12/13/2014 16:16   Ct Angio Chest Pe W/cm &/or Wo Cm  12/14/2014   CLINICAL DATA:  Acute onset of shortness of breath for 10 days. Shortness of breath is worsened with exertion. Initial encounter.  EXAM: CT ANGIOGRAPHY CHEST WITH CONTRAST  TECHNIQUE: Multidetector CT imaging of the chest was performed using the standard protocol during bolus administration of intravenous contrast. Multiplanar CT image reconstructions and MIPs were obtained to evaluate the vascular anatomy.  CONTRAST:  115mL OMNIPAQUE IOHEXOL 350 MG/ML SOLN  COMPARISON:  Chest radiograph performed earlier today at 9:18 p.m., and CT of the chest  performed earlier today at 2:11 p.m.  FINDINGS: There is no evidence of pulmonary embolus.  Scattered peripheral scarring and interstitial prominence are noted, with emphysematous change most prominent at the lung apices. There is focal scarring at the right lung apex, with adjacent postoperative change. There is no evidence of significant focal consolidation, pleural effusion or pneumothorax. No masses are identified; no abnormal focal contrast enhancement is seen.  Hazy masslike density in the subcarinal region is similar in appearance to the recent prior study, and decreased in size from pre-radiation studies, though there is still some degree of mass effect on the left atrium and pulmonary veins. No definite additional mediastinal lymphadenopathy is seen, though haziness extends anterior to the carina as on prior studies. No significant pericardial effusion is identified. The great vessels are grossly unremarkable.  A right-sided chest port is noted. No axillary lymphadenopathy is seen. The visualized portions of the thyroid gland are unremarkable in appearance.  A tiny hiatal hernia is noted. Diffuse calcified granulomata are seen within the spleen. The visualized portions of the liver and spleen are unremarkable. A 2.4 cm adrenal adenoma is noted at the left adrenal gland.  No acute osseous abnormalities are seen.  Review of the MIP images confirms the above findings.  IMPRESSION: 1. No evidence of pulmonary embolus. 2. Scattered peripheral scarring and interstitial prominence reflect chronic lung changes, with emphysema seen. Focal scarring  at the right lung apex. 3. Stable appearance to hazy mass-like density in the subcarinal region, reflecting residual nodal prominence. This is decreased in size from pre-radiation studies, though there is still some degree of mass effect on the left atrium and pulmonary veins. No additional mediastinal lymphadenopathy seen. 4. Tiny hiatal hernia noted. 5. 2.4 cm left  adrenal adenoma noted.   Electronically Signed   By: Garald Balding M.D.   On: 12/14/2014 00:30   Dg Chest Port 1 View  12/13/2014   CLINICAL DATA:  Acute onset of shortness of breath. Personal history of smoking and lung cancer. Initial encounter.  EXAM: PORTABLE CHEST - 1 VIEW  COMPARISON:  Chest radiograph from 12/31/2013, and CT of the chest performed earlier today at 2:11 p.m.  FINDINGS: Diffuse interstitial prominence reflects chronic underlying lung disease, as noted on recent CT. The small nodule at the left lung base is not well characterized on radiograph. No pleural effusion or pneumothorax is seen.  The cardiomediastinal silhouette is borderline normal in size. A right-sided chest port is noted ending about the distal SVC. Postoperative change is noted overlying the right lung apex.  No acute osseous abnormalities are identified.  IMPRESSION: Chronic lung changes again noted. No acute superimposed airspace consolidation seen. The CT of the chest performed earlier today demonstrated no evidence of significant pulmonary embolus.   Electronically Signed   By: Garald Balding M.D.   On: 12/13/2014 21:46   ASSESSMENT AND PLAN: This is a very pleasant 80 years old white female recently diagnosed with extensive stage small cell lung cancer currently undergoing systemic chemotherapy with carboplatin and etoposide status post 6 cycles.  This was followed by palliative radiotherapy to the residual mediastinal disease under the care of Dr. Tammi Klippel. The patient is feeling fine today with no specific complaints except for shortness of breath at baseline and increased with exertion most likely secondary to COPD and radiation induced pneumonitis. She is currently completing a course of prednisone taper. Her recent CT scan of the chest, abdomen and pelvis showed no evidence for disease progression. I discussed the scan results with the patient and her sister. I recommended for her to continue on observation  with repeat CT scan of the chest, abdomen and pelvis in 3 months. For the COPD, I advised the patient to see Dr. Melvyn Novas for reevaluation and adjustment of her breathing treatment. The patient continues to have persistent anemia most likely secondary to previous chemotherapy. We'll continue to monitor her closely for now. She was advised to call immediately if she has any concerning symptoms in the interval.  The patient voices understanding of current disease status and treatment options and is in agreement with the current care plan.  All questions were answered. The patient knows to call the clinic with any problems, questions or concerns. We can certainly see the patient much sooner if necessary.  Disclaimer: This note was dictated with voice recognition software. Similar sounding words can inadvertently be transcribed and may not be corrected upon review.

## 2015-01-01 ENCOUNTER — Encounter: Payer: Self-pay | Admitting: Internal Medicine

## 2015-01-01 ENCOUNTER — Ambulatory Visit (INDEPENDENT_AMBULATORY_CARE_PROVIDER_SITE_OTHER): Payer: Medicare Other | Admitting: Internal Medicine

## 2015-01-01 DIAGNOSIS — R0602 Shortness of breath: Secondary | ICD-10-CM

## 2015-01-01 DIAGNOSIS — Z85118 Personal history of other malignant neoplasm of bronchus and lung: Secondary | ICD-10-CM

## 2015-01-01 MED ORDER — BUDESONIDE-FORMOTEROL FUMARATE 160-4.5 MCG/ACT IN AERO: INHALATION_SPRAY | RESPIRATORY_TRACT | Status: DC

## 2015-01-01 NOTE — Patient Instructions (Addendum)
symbicort 160 Take 2 puffs first thing in am and then another 2 puffs about 12 hours later  Work on inhaler technique:  relax and gently blow all the way out then take a nice smooth deep breath back in, triggering the inhaler at same time you start breathing in.  Hold for up to 5 seconds if you can.  Rinse and gargle with water when done     Only use your albuterol (Proventil) as a rescue medication to be used if you can't catch your breath by resting or doing a relaxed purse lip breathing pattern.  - The less you use it, the better it will work when you need it. - Ok to use up to 2 puffs  every 4 hours if you must but call for immediate appointment if use goes up over your usual need - Don't leave home without it !!  (think of it like the spare tire for your car)   Please schedule a follow up office visit in 4 weeks, call sooner if needed with pfts

## 2015-01-01 NOTE — Progress Notes (Signed)
Subjective:    Patient ID: Frances Gonzalez, female    DOB: 1935-02-25  MRN: [id number]   Brief patient profile:  77 yowf quit smoking 2005 s/p RULobectomy by Arlyce Dice with no breathing problem at that point referred 01/10/2014 by Dr Sherren Mocha for eval of sob    History of Present Illness  01/10/2014 1st [facility] office visit/ Frances Gonzalez cc acute onset sob/sense of chest chestion in mid Dec 2014 assoc with runny nose and initially feverish rx and no better since then  on qvar bid and zyrtec.  No more fever. Never purulent secretions but almost constant daytime sensation of throat and chest "congestion" x 10 years (ever since post op)  Nose dripping p heat comes on in am x years, doe when walks and talks at same time or "gets in a real hurry". rec Stop losartan and qvar  Start avapro 150 one half daily  Continue prilosec but take it  Take 30-60 min before first meal of the day GERD diet    04/05/2014 f/u ov/Emaree Chiu re:  Doe if walking more than regular pace or carrying more than 10 lb asso with freq throat clearing and breathing worse with bending over despite rx with prilosec 20 mg ac daily  Chief Complaint  Patient presents with  . Follow-up    PFT done 04/03/14. She reports her breathing is unchanged since last cisit. No new co's today.   no worse off all inhalers but no betteron  either, still almost constant daytime urge to clear her throat but no excess mucus, day >> night,  Worse with voice use rec Take delsym two tsp every 12 hours  to suppress the urge to cough. Swallowing water or using ice chips/non mint and menthol containing hard candies (such as lifesavers or sugarless jolly ranchers) are also effective.  You should rest your voice and avoid activities that you know make you cough. Pantoprazole (protonix) 40 mg Take 30-60 min before first meal of the day Please remember to go to the lab department downstairs for your tests - we will call you with the results when they are  available. Late add: needs CTa chest and sinus CT  to complete w/u for unexplained doe     Oct 19 2014  finished RT R upper chest and last chemo Aug 2015    01/01/2015 1st [facility] office visit/ Nupur Hohman   Chief Complaint  Patient presents with  . Follow-up    f/u sob; Prednisone is not helping this time, she has 3 tablets left of Prednisone. Pt feels fine other than shortness of Breath.    still on ppi qd ac and no change p saba, no assoc cough Indolent onset minimal progression x 6 weeks of  Doe x fast pace or inclines or carrying objects  Albuterol does help for a few hours   No obvious day to day or daytime variabilty or assoc   cp or chest tightness, subjective wheeze overt sinus or hb symptoms. No unusual exp hx or h/o childhood pna/ asthma or knowledge of premature birth.  Sleeping ok without nocturnal  or early am exacerbation  of respiratory  c/o's or need for noct saba. Also denies any obvious fluctuation of symptoms with weather or environmental changes or other aggravating or alleviating factors except as outlined above   Current Medications, Allergies, Complete Past Medical History, Past Surgical History, Family History, and Social History were reviewed in [employer] record.  ROS  The following are  not active complaints unless bolded sore throat, dysphagia, dental problems, itching, sneezing,  nasal congestion or excess/ purulent secretions, ear ache,   fever, chills, sweats, unintended wt loss, pleuritic or exertional cp, hemoptysis,  orthopnea pnd or leg swelling, presyncope, palpitations, heartburn, abdominal pain, anorexia, nausea, vomiting, diarrhea  or change in bowel or urinary habits, change in stools or urine, dysuria,hematuria,  rash, arthralgias, visual complaints, headache, numbness weakness or ataxia or problems with walking or coordination,  change in mood/affect or memory.         Objective:   Physical Exam   amb wf nad freq throat  clearing   04/05/2014       140  > 01/01/2015   143  Wt Readings from Last 3 Encounters:  01/10/14 140 lb (63.504 kg)  12/31/13 141 lb (63.957 kg)  04/24/13 153 lb (69.4 kg)      HEENT: nl dentition, turbinates, and orophanx. Nl external ear canals without cough reflex   NECK :  without JVD/Nodes/TM/ nl carotid upstrokes bilaterally   LUNGS: no acc muscle use, clear to A and P bilaterally without cough on insp or exp maneuvers   CV:  RRR  no s3 or murmur or increase in P2, no edema   ABD:  soft and nontender with nl excursion in the supine position. No bruits or organomegaly, bowel sounds nl  MS:  warm without deformities, calf tenderness, cyanosis or clubbing            Recent Labs Lab 04/05/14 1705  NA 132*  K 4.4  CL 98  CO2 26  BUN 11  CREATININE 0.6  GLUCOSE 95    Recent Labs Lab 04/05/14 1705  HGB 12.6  HCT 36.5  WBC 5.9  PLT 245.0      Lab Results  Component Value Date   PROBNP 39.0 04/05/2014     . Lab Results  Component Value Date   TSH 2.21 04/05/2014    CTa 12/13/14 1. No evidence of pulmonary embolus. 2. Scattered peripheral scarring and interstitial prominence reflect chronic lung changes, with emphysema seen. Focal scarring at the right lung apex. 3. Stable appearance to hazy mass-like density in the subcarinal region, reflecting residual nodal prominence. This is decreased in size from pre-radiation studies, though there is still some degree of mass effect on the left atrium and pulmonary veins. No additional mediastinal lymphadenopathy seen. 4. Tiny hiatal hernia noted.  Lab Results  Component Value Date   PROBNP 268.9 12/13/2014     Lab Results  Component Value Date   WBC 3.8* 12/14/2014   HGB 9.8* 12/14/2014   HCT 28.2* 12/14/2014   MCV 90.1 12/14/2014   PLT 198 12/14/2014       Chemistry      Component Value Date/Time   NA 132* 12/14/2014 0435   NA 132* 12/13/2014 1255   K 3.8 12/14/2014 0435   K 4.0  12/13/2014 1255   CL 95* 12/14/2014 0435   CO2 22 12/14/2014 0435   CO2 24 12/13/2014 1255   BUN 12 12/14/2014 0435   BUN 18.6 12/13/2014 1255   CREATININE 0.70 12/14/2014 0435   CREATININE 0.9 12/13/2014 1255      Component Value Date/Time   CALCIUM 9.1 12/14/2014 0435   CALCIUM 9.7 12/13/2014 1255   ALKPHOS 76 12/14/2014 0435   ALKPHOS 88 12/13/2014 1255   AST 21 12/14/2014 0435   AST 22 12/13/2014 1255   ALT 9 12/14/2014 0435   ALT 12 12/13/2014 1255  BILITOT 0.4 12/14/2014 0435   BILITOT 0.55 12/13/2014 1255      Assessment & Plan:

## 2015-01-02 NOTE — Assessment & Plan Note (Addendum)
12/13/14 1. No evidence of pulmonary embolus. 2. Scattered peripheral scarring and interstitial prominence reflect chronic lung changes, with emphysema seen. Focal scarring at the right lung apex. 3. Stable appearance to hazy mass-like density in the subcarinal region, reflecting residual nodal prominence. This is decreased in size from pre-radiation studies, though there is still some degree of mass effect on the left atrium and pulmonary veins. No additional mediastinal lymphadenopathy seen. 4. Tiny hiatal hernia noted.  Symptoms are ma  disproportionate to objective findings and not clear this is all a lung problem but pt does appear to have difficult airway management issues. DDX of  difficult airways management all start with A and  include Adherence, Ace Inhibitors, Acid Reflux, Active Sinus Disease, Alpha 1 Antitripsin deficiency, Anxiety masquerading as Airways dz,  ABPA,  allergy(esp in young), Aspiration (esp in elderly), Adverse effects of DPI,  Active smokers, plus two Bs  = Bronchiectasis and Beta blocker use..and one C= CHF  Adherence is always the initial "prime suspect" and is a multilayered concern that requires a "trust but verify" approach in every patient - starting with knowing how to use medications, especially inhalers, correctly, keeping up with refills and understanding the fundamental difference between maintenance and prns vs those medications only taken for a very short course and then stopped and not refilled.  The proper method of use, as well as anticipated side effects, of a metered-dose inhaler are discussed and demonstrated to the patient. Improved effectiveness after extensive coaching during this visit to a level of approximately  75% so for now continue symbicort and prn saba   ? Acid (or non-acid) GERD > always difficult to exclude as up to 75% of pts in some series report no assoc GI/ Heartburn symptoms> rec max (24h)  acid suppression and diet restrictions/  reviewed and instructions given in writing.   ? chf seems unlikely though note bnp is in the indeterminate range    F/u with full pfts in 4 weeks

## 2015-01-06 ENCOUNTER — Inpatient Hospital Stay (HOSPITAL_COMMUNITY)
Admission: EM | Admit: 2015-01-06 | Discharge: 2015-01-13 | DRG: 205 | Disposition: A | Discharge status: Home Health | Payer: Medicare Other | Attending: Internal Medicine | Admitting: Internal Medicine

## 2015-01-06 ENCOUNTER — Telehealth: Payer: Self-pay | Admitting: Internal Medicine

## 2015-01-06 ENCOUNTER — Emergency Department (HOSPITAL_COMMUNITY): Payer: Medicare Other

## 2015-01-06 ENCOUNTER — Inpatient Hospital Stay (HOSPITAL_COMMUNITY): Payer: Medicare Other

## 2015-01-06 ENCOUNTER — Encounter (HOSPITAL_COMMUNITY): Payer: Self-pay | Admitting: Emergency Medicine

## 2015-01-06 DIAGNOSIS — R0602 Shortness of breath: Secondary | ICD-10-CM | POA: Diagnosis not present

## 2015-01-06 DIAGNOSIS — Z923 Personal history of irradiation: Secondary | ICD-10-CM | POA: Diagnosis not present

## 2015-01-06 DIAGNOSIS — I10 Essential (primary) hypertension: Secondary | ICD-10-CM | POA: Diagnosis present

## 2015-01-06 DIAGNOSIS — Z87891 Personal history of nicotine dependence: Secondary | ICD-10-CM

## 2015-01-06 DIAGNOSIS — R0902 Hypoxemia: Secondary | ICD-10-CM | POA: Diagnosis present

## 2015-01-06 DIAGNOSIS — J9601 Acute respiratory failure with hypoxia: Secondary | ICD-10-CM | POA: Diagnosis present

## 2015-01-06 DIAGNOSIS — Z79899 Other long term (current) drug therapy: Secondary | ICD-10-CM | POA: Diagnosis not present

## 2015-01-06 DIAGNOSIS — E876 Hypokalemia: Secondary | ICD-10-CM | POA: Diagnosis present

## 2015-01-06 DIAGNOSIS — Z825 Family history of asthma and other chronic lower respiratory diseases: Secondary | ICD-10-CM | POA: Diagnosis not present

## 2015-01-06 DIAGNOSIS — Z9221 Personal history of antineoplastic chemotherapy: Secondary | ICD-10-CM

## 2015-01-06 DIAGNOSIS — I248 Other forms of acute ischemic heart disease: Secondary | ICD-10-CM | POA: Diagnosis present

## 2015-01-06 DIAGNOSIS — R000 Tachycardia, unspecified: Secondary | ICD-10-CM | POA: Diagnosis present

## 2015-01-06 DIAGNOSIS — J9691 Respiratory failure, unspecified with hypoxia: Secondary | ICD-10-CM

## 2015-01-06 DIAGNOSIS — K219 Gastro-esophageal reflux disease without esophagitis: Secondary | ICD-10-CM | POA: Diagnosis present

## 2015-01-06 DIAGNOSIS — J700 Acute pulmonary manifestations due to radiation: Principal | ICD-10-CM | POA: Diagnosis present

## 2015-01-06 DIAGNOSIS — I313 Pericardial effusion (noninflammatory): Secondary | ICD-10-CM | POA: Diagnosis present

## 2015-01-06 DIAGNOSIS — Z85118 Personal history of other malignant neoplasm of bronchus and lung: Secondary | ICD-10-CM

## 2015-01-06 DIAGNOSIS — E871 Hypo-osmolality and hyponatremia: Secondary | ICD-10-CM | POA: Diagnosis not present

## 2015-01-06 DIAGNOSIS — Z66 Do not resuscitate: Secondary | ICD-10-CM | POA: Diagnosis present

## 2015-01-06 DIAGNOSIS — J441 Chronic obstructive pulmonary disease with (acute) exacerbation: Secondary | ICD-10-CM

## 2015-01-06 DIAGNOSIS — C3490 Malignant neoplasm of unspecified part of unspecified bronchus or lung: Secondary | ICD-10-CM | POA: Diagnosis present

## 2015-01-06 LAB — PROTIME-INR
INR: 1.07 (ref 0.00–1.49)
PROTHROMBIN TIME: 14 s (ref 11.6–15.2)

## 2015-01-06 LAB — BASIC METABOLIC PANEL
Anion gap: 10 (ref 5–15)
BUN: 22 mg/dL (ref 6–23)
CO2: 23 mmol/L (ref 19–32)
CREATININE: 0.86 mg/dL (ref 0.50–1.10)
Calcium: 8.2 mg/dL — ABNORMAL LOW (ref 8.4–10.5)
Chloride: 92 mEq/L — ABNORMAL LOW (ref 96–112)
GFR calc Af Amer: 74 mL/min — ABNORMAL LOW (ref >90)
GFR calc non Af Amer: 64 mL/min — ABNORMAL LOW (ref >90)
GLUCOSE: 132 mg/dL — AB (ref 70–99)
Potassium: 4 mmol/L (ref 3.5–5.1)
SODIUM: 125 mmol/L — AB (ref 135–145)

## 2015-01-06 LAB — APTT: aPTT: 36 seconds (ref 24–37)

## 2015-01-06 LAB — COMPREHENSIVE METABOLIC PANEL
ALK PHOS: 68 U/L (ref 39–117)
ALT: 17 U/L (ref 0–35)
ANION GAP: 9 (ref 5–15)
AST: 35 U/L (ref 0–37)
Albumin: 3.4 g/dL — ABNORMAL LOW (ref 3.5–5.2)
BILIRUBIN TOTAL: 1.1 mg/dL (ref 0.3–1.2)
BUN: 22 mg/dL (ref 6–23)
CHLORIDE: 92 meq/L — AB (ref 96–112)
CO2: 24 mmol/L (ref 19–32)
Calcium: 8.3 mg/dL — ABNORMAL LOW (ref 8.4–10.5)
Creatinine, Ser: 0.7 mg/dL (ref 0.50–1.10)
GFR calc Af Amer: >90 mL/min (ref >90)
GFR calc non Af Amer: 81 mL/min — ABNORMAL LOW (ref >90)
Glucose, Bld: 134 mg/dL — ABNORMAL HIGH (ref 70–99)
Potassium: 4.1 mmol/L (ref 3.5–5.1)
Sodium: 125 mmol/L — ABNORMAL LOW (ref 135–145)
Total Protein: 6.5 g/dL (ref 6.0–8.3)

## 2015-01-06 LAB — CBC
HEMATOCRIT: 33.6 % — AB (ref 36.0–46.0)
Hemoglobin: 11.6 g/dL — ABNORMAL LOW (ref 12.0–15.0)
MCH: 30.9 pg (ref 26.0–34.0)
MCHC: 34.5 g/dL (ref 30.0–36.0)
MCV: 89.6 fL (ref 78.0–100.0)
PLATELETS: 166 10*3/uL (ref 150–400)
RBC: 3.75 MIL/uL — ABNORMAL LOW (ref 3.87–5.11)
RDW: 15.4 % (ref 11.5–15.5)
WBC: 9.5 10*3/uL (ref 4.0–10.5)

## 2015-01-06 LAB — TROPONIN I
TROPONIN I: 0.2 ng/mL — AB (ref <0.031)
Troponin I: 0.2 ng/mL — ABNORMAL HIGH (ref <0.031)
Troponin I: 0.14 ng/mL — ABNORMAL HIGH (ref <0.031)

## 2015-01-06 LAB — MAGNESIUM: MAGNESIUM: 1.4 mg/dL — AB (ref 1.5–2.5)

## 2015-01-06 LAB — I-STAT TROPONIN, ED: Troponin i, poc: 0.12 ng/mL (ref 0.00–0.08)

## 2015-01-06 LAB — SODIUM, URINE, RANDOM: SODIUM UR: 21 mmol/L

## 2015-01-06 LAB — PHOSPHORUS: Phosphorus: 4.2 mg/dL (ref 2.3–4.6)

## 2015-01-06 LAB — MRSA PCR SCREENING: MRSA by PCR: NEGATIVE

## 2015-01-06 LAB — LACTIC ACID, PLASMA: Lactic Acid, Venous: 1.4 mmol/L (ref 0.5–2.2)

## 2015-01-06 LAB — BRAIN NATRIURETIC PEPTIDE: B Natriuretic Peptide: 322.6 pg/mL — ABNORMAL HIGH (ref 0.0–100.0)

## 2015-01-06 MED ORDER — LEVOFLOXACIN IN D5W 750 MG/150ML IV SOLN
750.0000 mg | INTRAVENOUS | Status: DC
  Administered 2015-01-07 – 2015-01-08 (×2): 750 mg via INTRAVENOUS
  Filled 2015-01-06 (×3): qty 150

## 2015-01-06 MED ORDER — CHLORHEXIDINE GLUCONATE 0.12 % MT SOLN
15.0000 mL | Freq: Two times a day (BID) | OROMUCOSAL | Status: DC
  Administered 2015-01-06 – 2015-01-13 (×14): 15 mL via OROMUCOSAL
  Filled 2015-01-06 (×16): qty 15

## 2015-01-06 MED ORDER — SODIUM CHLORIDE 0.9 % IV SOLN
INTRAVENOUS | Status: AC
  Administered 2015-01-06: 17:00:00 via INTRAVENOUS

## 2015-01-06 MED ORDER — ENOXAPARIN SODIUM 40 MG/0.4ML ~~LOC~~ SOLN
40.0000 mg | SUBCUTANEOUS | Status: DC
  Administered 2015-01-06 – 2015-01-12 (×7): 40 mg via SUBCUTANEOUS
  Filled 2015-01-06 (×8): qty 0.4

## 2015-01-06 MED ORDER — IOHEXOL 350 MG/ML SOLN
100.0000 mL | Freq: Once | INTRAVENOUS | Status: AC | PRN
  Administered 2015-01-06: 100 mL via INTRAVENOUS

## 2015-01-06 MED ORDER — SODIUM CHLORIDE 0.9 % IJ SOLN
INTRAMUSCULAR | Status: AC
Start: 2015-01-06 — End: 2015-01-07
  Filled 2015-01-06: qty 10

## 2015-01-06 MED ORDER — ALBUTEROL SULFATE HFA 108 (90 BASE) MCG/ACT IN AERS: 2.0000 | INHALATION_SPRAY | RESPIRATORY_TRACT | Status: DC | PRN

## 2015-01-06 MED ORDER — PANTOPRAZOLE SODIUM 40 MG PO TBEC: 40.0000 mg | DELAYED_RELEASE_TABLET | Freq: Every day | ORAL | Status: DC

## 2015-01-06 MED ORDER — BUDESONIDE-FORMOTEROL FUMARATE 160-4.5 MCG/ACT IN AERO
2.0000 | INHALATION_SPRAY | Freq: Two times a day (BID) | RESPIRATORY_TRACT | Status: DC
  Administered 2015-01-06 – 2015-01-13 (×14): 2 via RESPIRATORY_TRACT
  Filled 2015-01-06: qty 6

## 2015-01-06 MED ORDER — LEVOFLOXACIN IN D5W 750 MG/150ML IV SOLN
750.0000 mg | INTRAVENOUS | Status: DC
  Administered 2015-01-06: 750 mg via INTRAVENOUS
  Filled 2015-01-06: qty 150

## 2015-01-06 MED ORDER — METHYLPREDNISOLONE SODIUM SUCC 40 MG IJ SOLR
40.0000 mg | Freq: Four times a day (QID) | INTRAMUSCULAR | Status: AC
  Administered 2015-01-06 – 2015-01-07 (×2): 40 mg via INTRAVENOUS
  Filled 2015-01-06 (×2): qty 1

## 2015-01-06 MED ORDER — HEPARIN (PORCINE) IN NACL 100-0.45 UNIT/ML-% IJ SOLN
800.0000 [IU]/h | INTRAMUSCULAR | Status: DC
  Filled 2015-01-06: qty 250

## 2015-01-06 MED ORDER — CETYLPYRIDINIUM CHLORIDE 0.05 % MT LIQD
7.0000 mL | Freq: Two times a day (BID) | OROMUCOSAL | Status: DC
  Administered 2015-01-07 – 2015-01-12 (×8): 7 mL via OROMUCOSAL

## 2015-01-06 MED ORDER — ALBUTEROL SULFATE (2.5 MG/3ML) 0.083% IN NEBU: 2.5000 mg | INHALATION_SOLUTION | RESPIRATORY_TRACT | Status: DC | PRN

## 2015-01-06 MED ORDER — HEPARIN BOLUS VIA INFUSION
2500.0000 [IU] | Freq: Once | INTRAVENOUS | Status: DC
  Filled 2015-01-06: qty 2500

## 2015-01-06 MED ORDER — PREDNISONE 20 MG PO TABS
60.0000 mg | ORAL_TABLET | Freq: Once | ORAL | Status: AC
  Administered 2015-01-06: 60 mg via ORAL
  Filled 2015-01-06: qty 3

## 2015-01-06 MED ORDER — METHYLPREDNISOLONE SODIUM SUCC 40 MG IJ SOLR
40.0000 mg | Freq: Four times a day (QID) | INTRAMUSCULAR | Status: DC
Start: 2015-01-06 — End: 2015-01-06
  Administered 2015-01-06: 40 mg via INTRAVENOUS
  Filled 2015-01-06: qty 1

## 2015-01-06 MED ORDER — PANTOPRAZOLE SODIUM 40 MG PO TBEC
40.0000 mg | DELAYED_RELEASE_TABLET | Freq: Every day | ORAL | Status: DC
  Administered 2015-01-07 – 2015-01-13 (×7): 40 mg via ORAL
  Filled 2015-01-06 (×7): qty 1

## 2015-01-06 NOTE — ED Provider Notes (Signed)
  Face-to-face evaluation   History: She presents for evaluation of shortness of breath.  Recent ongoing problem.  She was recently evaluated by pulmonology, and started on Spiriva, and albuterol rescue inhaler, 5 days ago.  She reports continually worse with more more dyspnea on exertion.  He is not having dizziness, or chest pain.    Physical exam: Alert, calm, cooperative.  No wrist or distress.  She is on face mask oxygen at 100%, with a saturation of 99%.  She is tachycardic.  Lungs clear bilaterally without wheezes, rales or rhonchi.  I removed her facemask oxygen for about 4 minutes, and her oxygenation rapidly dropped to 80%.  The oxygen was replaced in her face and within 2 minutes was back to normal.  Assessment- dyspnea with hypoxia.  CRITICAL CARE Performed by: Richarda Blade Total critical care time:35 minutes Critical care time was exclusive of separately billable procedures and treating other patients. Critical care was necessary to treat or prevent imminent or life-threatening deterioration. Critical care was time spent personally by me on the following activities: development of treatment plan with patient and/or surrogate as well as nursing, discussions with consultants, evaluation of patient's response to treatment, examination of patient, obtaining history from patient or surrogate, ordering and performing treatments and interventions, ordering and review of laboratory studies, ordering and review of radiographic studies, pulse oximetry and re-evaluation of patient's condition.   15:40- discussed with pulmonary critical care Joya Gaskins), they recommend CT angiogram of Chest, if creatinine normal and they will see the patient, in emergency department.  Medical screening examination/treatment/procedure(s) were conducted as a shared visit with non-physician practitioner(s) and myself.  I personally evaluated the patient during the encounter   Richarda Blade, MD 01/10/15  [phone]

## 2015-01-06 NOTE — ED Notes (Signed)
Pt began feeling SOB on excretion on Saturday. Does not wear home O2. Pt sats by EMS low80s on [place] pt place on NRB and improved. Pt has history of lung cancer and COPD.

## 2015-01-06 NOTE — Telephone Encounter (Signed)
Pt's sister, Baker Janus, is calling back stating the pt has worsened since last call at 10:45. Baker Janus stated the pt is "gasping for air and cannot even walk straight". Baker Janus stated she cannot wait on MW's response. Informed Baker Janus that if pt's condition has worsened enough that waiting isn't an option, then pt will need to go to the ED. Baker Janus verbalized understanding and stated she would call EMS.   Will send to MW as Juluis Rainier.

## 2015-01-06 NOTE — Progress Notes (Signed)
  CARE MANAGEMENT ED NOTE 01/06/2015  Patient:  Frances Gonzalez, Frances Gonzalez   Account Number:  [id number]  Date Initiated:  01/06/2015  Documentation initiated by:  Livia Snellen  Subjective/Objective Assessment:   Patient presents to ED with shortness of breath hypoxemia     Subjective/Objective Assessment Detail:   Patient with pmhx of small lung cancer with chemo and radiation therapy.  Patient tachcardic, pulse ox dropped to 77% in the ED patient on 14 liters via face mask.  Patient noted to be admitted 12/18 to 12/19 for shortness of breath     Action/Plan:   Action/Plan Detail:   Anticipated DC Date:       Status Recommendation to Physician:   Result of Recommendation:    Other ED Perla  Other  PCP issues    Choice offered to / List presented to:            Status of service:  Completed, signed off  ED Comments:   ED Comments Detail:  EDCM spoke to patient at bedside.  Patient confirms she has been seen by dr. Earlie Server on 01/04 and Dr. Melvyn Novas on 01/06. Patient reports she last saw Dr. Sherren Mocha last January and does not consider him her pcp, she considers Dr. Earlie Server to be her pcp.  Patient is unsure if she received a follow up phone call after her previous admission.  Patient confirms she does nto wear oxygen at home.   Patient lives alone. Patient doesn't have any home health services at htis time. Patient does not have any medical equipment at home.  This information was placed in readmission focus note.  No further EDCM needs at this time.

## 2015-01-06 NOTE — Telephone Encounter (Signed)
Last OV 01-01-15. I spoke with the pt and she states that after last OV on Wed she started to feel some better on Thursday and Friday. She states then over the weekend she began to have increased SOB with minimal activity on Sat and Sunday. Last does of prednisone was Friday PM.  Pt states the SOB is worse then at last OV. She denies any SOB at rest. She states she also has a dry cough but this is not any worse then usually. She states she is taking symbicort twice daily and has used her albuterol inhaler when she has the increased SOB and it helps for a short time. Pt states SOB improves with rest. Please advise. Vandling Bing, CMA  No Known Allergies

## 2015-01-06 NOTE — Progress Notes (Signed)
Utilization Review completed.  Havier Deeb RN CM  

## 2015-01-06 NOTE — ED Provider Notes (Signed)
CSN: [id number]     Arrival date & time 01/06/15  1338 History   First MD Initiated Contact with Patient 01/06/15 1407     Chief Complaint  Patient presents with  . Respiratory Distress     (Consider location/radiation/quality/duration/timing/severity/associated sxs/prior Treatment) HPI Comments: The patient is a 80 year old female with past medical history of small cell lung cancer, hypertension, presenting emergency room chief complaint of worsening dyspnea over the past several days. Patient reports being evaluated by Dr. Melvyn Novas (01/01/2015) for similar complaints was prescribed prednisone without resolution of symptoms. Patient reports exertional dyspnea. She reports she is unable to walk across the room without feeling short of breath. Patient is not on home oxygen. Patient denies chest pain, chest tightness, pleurisy, lower extremity edema. Patient reports resolution of symptoms after nonrebreather applied.  Last Chemotherapy treatment 07/2014 Last Radiation treatment 09/2014  The history is provided by the patient. No language interpreter was used.    Past Medical History  Diagnosis Date  . Hypertension   . Lung cancer dx'd 2010 & 2015    2 primary lung ca rul and mediastinum   Past Surgical History  Procedure Laterality Date  . Lung cancer surgery  02/24/04    RUL Dr Arlyce Dice   Family History  Problem Relation Age of Onset  . Emphysema Maternal Grandmother     smoked  . Cancer Neg Hx    History  Substance Use Topics  . Smoking status: Former Smoker -- 1.00 packs/day for 50 years    Types: Cigarettes    Quit date: 02/24/2004  . Smokeless tobacco: Never Used  . Alcohol Use: No   OB History    No data available     Review of Systems  Constitutional: Negative for fever and chills.  Respiratory: Positive for shortness of breath. Negative for cough and chest tightness.   Cardiovascular: Negative for chest pain and leg swelling.  Gastrointestinal: Negative for nausea,  vomiting and abdominal pain.      Allergies  Review of patient's allergies indicates no known allergies.  Home Medications   Prior to Admission medications   Medication Sig Start Date End Date Taking? Authorizing Provider  albuterol (PROVENTIL HFA;VENTOLIN HFA) 108 (90 BASE) MCG/ACT inhaler Inhale 2 puffs into the lungs every 6 (six) hours as needed for wheezing or shortness of breath. 12/14/14  Yes Nishant Dhungel, MD  budesonide-formoterol (SYMBICORT) 160-4.5 MCG/ACT inhaler Take 2 puffs first thing in am and then another 2 puffs about 12 hours later. 01/01/15  Yes Tanda Rockers, MD  irbesartan (AVAPRO) 150 MG tablet Take 75 mg by mouth daily. Half tablet daily   Yes Historical Provider, MD  Multiple Vitamin (MULTIVITAMIN) tablet Take 1 tablet by mouth daily.   Yes Historical Provider, MD  pantoprazole (PROTONIX) 40 MG tablet Take 40 mg by mouth daily. 09/02/14  Yes Historical Provider, MD  simvastatin (ZOCOR) 20 MG tablet TAKE 1/2 TABLET BY MOUTH EVERY NIGHT AT BEDTIME 09/23/14  Yes Dorena Cookey, MD   BP 93/65 mmHg  Pulse 125  Temp(Src) 98.4 F (36.9 C) (Oral)  Resp 26  SpO2 100% Physical Exam  Constitutional: She is oriented to person, place, and time. She appears well-developed and well-nourished. No distress.  HENT:  Head: Normocephalic and atraumatic.  Neck: Neck supple.  Cardiovascular: Regular rhythm.  Tachycardia present.   115-125 No lower extremity edema. No calf tenderness.  Pulmonary/Chest: Effort normal. No respiratory distress. She has no wheezes. She has no rales.  Crackles at bases.  Patient is able to speak in complete sentences.   Abdominal: Soft. There is no tenderness. There is no rebound and no guarding.  Neurological: She is alert and oriented to person, place, and time.  Skin: Skin is warm and dry. She is not diaphoretic.  Psychiatric: She has a normal mood and affect. Her behavior is normal.  Nursing note and vitals reviewed.   ED Course  Procedures  (including critical care time) Labs Review Labs Reviewed  CBC - Abnormal; Notable for the following:    RBC 3.75 (*)    Hemoglobin 11.6 (*)    HCT 33.6 (*)    All other components within normal limits  I-STAT TROPOININ, ED - Abnormal; Notable for the following:    Troponin i, poc 0.12 (*)    All other components within normal limits  BASIC METABOLIC PANEL  TROPONIN I  COMPREHENSIVE METABOLIC PANEL  MAGNESIUM  PHOSPHORUS  TROPONIN I  TROPONIN I  TROPONIN I  APTT  PROTIME-INR  CORTISOL  LACTIC ACID, PLASMA    Imaging Review Dg Chest 2 View (if Patient Has Fever And/or Copd)  01/06/2015   CLINICAL DATA:  Shortness of breath.  History of lung cancer.  EXAM: CHEST  2 VIEW  COMPARISON:  CT scan 12/13/2014. Single view of the chest 12/13/2014. PA and lateral chest 12/31/2013.  FINDINGS: Right IJ approach Port-A-Cath remains in place. Surgical clips are again seen in the right lung apex Lung volumes are slightly lower than on the comparison exam. Heart size is mildly enlarged. The lungs are emphysematous with coarsened interstitium. No consolidative process, pneumothorax or effusion is identified.  IMPRESSION: Cardiomegaly and emphysema without acute disease.   Electronically Signed   By: Inge Rise M.D.   On: 01/06/2015 14:40     EKG Interpretation   Date/Time:  Monday January 06 2015 14:58:52 EST Ventricular Rate:  116 PR Interval:  115 QRS Duration: 81 QT Interval:  333 QTC Calculation: 463 R Axis:   13 Text Interpretation:  Sinus tachycardia Consider right atrial enlargement  Low voltage, precordial leads Since last tracing rate faster Confirmed by  WENTZ  MD, ELLIOTT ([id number]) on 01/06/2015 3:26:35 PM      MDM   Final diagnoses:  SOB (shortness of breath)  Hypoxia  History of lung cancer   Pt with dyspnea, 77% RA, 100% on NRB tachycardic 120's. Pt able to speak in complete sentences. XR without vascular congestion.  Given history of lung cancer, tachycardia,  hypoxia, plan to rule out PE.   12/14/2014- CT-A negative for PE, emphysema changes with focal scarring, stable appearance to hazy mass-like density in the subcarinal region, reflecting residual nodal prominence. This is decreased in size from pre-radiation studies, though there is still some degree of mass effect on the left atrium and pulmonary veins. No additional mediastinal lymphadenopathy seen. Troponin elevated 0.12 Discussed with Dr. Eulis Foster who also evaluated the patient, pulmonologist consulted. Plan for admission for hypoxia.     Harvie Heck, PA-C 01/06/15 [place], MD 01/10/15 [phone]

## 2015-01-06 NOTE — H&P (Signed)
Name: Frances Gonzalez MRN: [id number] DOB: 08/09/1935    ADMISSION DATE:  01/06/2015  REFERRING MD :  Dr. Eulis Foster  CHIEF COMPLAINT:  SOB   BRIEF PATIENT DESCRIPTION: 80 y/o F with PMH of small cell Lung CA s/p chemo / XRT who presented 1/11 with a 2-3 wk hx of progressive SOB and profound hypoxemia.  PCCM consulted for evaluation.   SIGNIFICANT EVENTS  1/11  Admit with SOB, hypoxemia  STUDIES:  CT Chest 1/11 >>    HISTORY OF PRESENT ILLNESS:  80 y/o F, former smoker, with PMH of HTN, small cell lung cancer with large mediastinal mass, bilateral mediastinal LAN, bilateral pulmonary nodules, supraclavicular LAN and questionable malignant pericardial effusion which was diagnosed in April 2015 who presented to the [facility] emergency room on 1/11 with a 2 to three-week history of progressive shortness of breath.  She was admitted 12/13/14 with similar symptoms and PE/DVT was ruled out.   CTA of the chest at that time was negative for PE demonstrated scattered peripheral scarring reflective of chronic changes and emphysema. Focal scarring noted at the right lung apex, hazy masslike density in the subcarinal region which was decreased from previous preradiation studies.  Patient reports she was initially seen in urgent care and given 3 days of prednisone without relief of shortness of breath in late December.  She then called the answering service and additional time and was given a short course of oral antibiotics which is unclear if she actually completed. On day of admission her sister reported that the patient could not stand up and walk straight vomiting EMS evaluation.  On arrival to the ER oxygen saturations are noted to be in the low 80s with improvement after application of 621% nonrebreather.  Initial chest x-ray was negative for acute infiltrate/edema. Lab work notable for sodium 125, serum creatinine 0.86, troponin 0.12, hemoglobin 11.6, and platelets 166. EKG was negative for acute ST  elevation. PCCM consulted for admission.      PAST MEDICAL HISTORY :   has a past medical history of Hypertension and Lung cancer (dx'd 2010 & 2015).  has past surgical history that includes Lung cancer surgery (02/24/04).   HOME MEDICATIONS:  Prior to Admission medications   Medication Sig Start Date End Date Taking? Authorizing Provider  albuterol (PROVENTIL HFA;VENTOLIN HFA) 108 (90 BASE) MCG/ACT inhaler Inhale 2 puffs into the lungs every 6 (six) hours as needed for wheezing or shortness of breath. 12/14/14  Yes Nishant Dhungel, MD  budesonide-formoterol (SYMBICORT) 160-4.5 MCG/ACT inhaler Take 2 puffs first thing in am and then another 2 puffs about 12 hours later. 01/01/15  Yes Tanda Rockers, MD  irbesartan (AVAPRO) 150 MG tablet Take 75 mg by mouth daily. Half tablet daily   Yes Historical Provider, MD  Multiple Vitamin (MULTIVITAMIN) tablet Take 1 tablet by mouth daily.   Yes Historical Provider, MD  pantoprazole (PROTONIX) 40 MG tablet Take 40 mg by mouth daily. 09/02/14  Yes Historical Provider, MD  simvastatin (ZOCOR) 20 MG tablet TAKE 1/2 TABLET BY MOUTH EVERY NIGHT AT BEDTIME 09/23/14  Yes Dorena Cookey, MD   No Known Allergies  FAMILY HISTORY:  family history includes Emphysema in her maternal grandmother. There is no history of Cancer.   SOCIAL HISTORY:  reports that she quit smoking about 10 years ago. Her smoking use included Cigarettes. She has a 50 pack-year smoking history. She has never used smokeless tobacco. She reports that she does not drink alcohol or use  illicit drugs.  REVIEW OF SYSTEMS:   Constitutional: Negative for fever, chills, weight loss, malaise/fatigue and diaphoresis.  Reports one day of body aches approx 4 days prior to admit HENT: Negative for hearing loss, ear pain, nosebleeds, congestion, sore throat, neck pain, tinnitus and ear discharge.   Eyes: Negative for blurred vision, double vision, photophobia, pain, discharge and redness.  Respiratory:  Negative for cough, hemoptysis, sputum production, wheezing and stridor.  Reports SOB, DOE Cardiovascular: Negative for chest pain, palpitations, orthopnea, claudication, leg swelling and PND.  Gastrointestinal: Negative for heartburn, nausea, vomiting, abdominal pain, diarrhea, constipation, blood in stool and melena.  Genitourinary: Negative for dysuria, urgency, frequency, hematuria and flank pain.  Musculoskeletal: Negative for myalgias, back pain, joint pain and falls.  Skin: Negative for itching and rash.  Neurological: Negative for dizziness, tingling, tremors, sensory change, speech change, focal weakness, seizures, loss of consciousness, weakness and headaches.  Endo/Heme/Allergies: Negative for environmental allergies and polydipsia. Does not bruise/bleed easily.  SUBJECTIVE:   VITAL SIGNS: Temp:  [98.4 F (36.9 C)] 98.4 F (36.9 C) (01/11 1358) Pulse Rate:  [114-130] 114 (01/11 1559) Resp:  [14-26] 14 (01/11 1559) BP: (93-119)/(65-81) 119/81 mmHg (01/11 1559) SpO2:  [77 %-100 %] 98 % (01/11 1559)  PHYSICAL EXAMINATION: General:  wdwn female in NAD Neuro:  AAOx4, speech clear, MAE HEENT:  Mm pink/moist, no jvd, ? Sclera icterus  Cardiovascular:  s1s2 mild tachy, no m/r/g Lungs:  resp's even/non-labored, lungs bilaterally with crackles lower posterior, some clearing with cough Abdomen:  Round/soft, bsx4 active  Musculoskeletal:  No acute deformities  Skin:  Warm/dry, no edema.  LE symmetrical   No results for input(s): NA, K, CL, CO2, BUN, CREATININE, GLUCOSE in the last 168 hours.  Recent Labs Lab 01/06/15 1506  HGB 11.6*  HCT 33.6*  WBC 9.5  PLT 166   Dg Chest 2 View (if Patient Has Fever And/or Copd)  01/06/2015   CLINICAL DATA:  Shortness of breath.  History of lung cancer.  EXAM: CHEST  2 VIEW  COMPARISON:  CT scan 12/13/2014. Single view of the chest 12/13/2014. PA and lateral chest 12/31/2013.  FINDINGS: Right IJ approach Port-A-Cath remains in place. Surgical  clips are again seen in the right lung apex Lung volumes are slightly lower than on the comparison exam. Heart size is mildly enlarged. The lungs are emphysematous with coarsened interstitium. No consolidative process, pneumothorax or effusion is identified.  IMPRESSION: Cardiomegaly and emphysema without acute disease.   Electronically Signed   By: Inge Rise M.D.   On: 01/06/2015 14:40    ASSESSMENT / PLAN:  Acute Hypoxic Respiratory Failure - DDX PE, Flu, CHF, underlying undiagnosed hypoxia in setting of chronic lung disease, and less likely pneumonitis s/p XRT Small Cell Lung CA - s/p XRT COPD Former Tobacco Abuse  Plan: Continue albuterol, symbicort PPI to ensure to GERD contribution to sx  Pulmonary hygiene Oxygen to support sats 88-95% CTA chest to ensure no PE and evaluate the parenchyma with positive troponin Assess ECHO, BNP Levaquin IV Prednisone taper RVP  Hyponatremia  - admission Na 125, down from 132 on 12/19.  Rule out SIADH  Plan: Urine studies:  Na, Osmol, Cr  Saline @ 50 ml /hr x 1 L     HTN - normotensive on admission  Plan: Hold home medications     GOC:  Discussed with patient regarding EOL needs and she desires for natural death.     Noe Gens, NP-C [facility]  Pgr: [phone] or [phone]  Lung cancer, positive troponin, SOB, concern for lung cancer, CTA ordered, echo ordered, hyponatremia with urine work up, steroids and abx ordered, bronchodilators and O2 as needed, will likely need home O2, will need an ambulatory desat study upon discharge.  Will admit to SDU.  Patient seen and examined, agree with above note.  I dictated the care and orders written for this patient under my direction.  Rush Farmer, MD [phone]  01/06/2015, 4:33 PM

## 2015-01-06 NOTE — Telephone Encounter (Signed)
Pt calling back

## 2015-01-06 NOTE — Progress Notes (Signed)
ANTICOAGULATION CONSULT NOTE - Initial Consult  Pharmacy Consult for Heparin Indication: PE  No Known Allergies  Patient Measurements: Height: 5\' 1"  (154.9 cm) Weight: 140 lb (63.504 kg) IBW/kg (Calculated) : 47.8 Heparin Dosing Weight: 50 kg  Vital Signs: Temp: 98.4 F (36.9 C) (01/11 1358) Temp Source: Oral (01/11 1358) BP: 119/81 mmHg (01/11 1559) Pulse Rate: 114 (01/11 1559)  Labs:  Recent Labs  01/06/15 1506  HGB 11.6*  HCT 33.6*  PLT 166    Estimated Creatinine Clearance: 51.5 mL/min (by C-G formula based on Cr of 0.7).   Medical History: Past Medical History  Diagnosis Date  . Hypertension   . Lung cancer dx'd 2010 & 2015    2 primary lung ca rul and mediastinum    Medications:  Infusions:  . sodium chloride    . heparin    . heparin    . levofloxacin      Assessment: 37 yoF admitted 1/11 with c/o SOB that started on Saturday and PMH lung cancer and HTN.  Pharmacy is consulted to dose Heparin IV for suspected PE.   INR: in process  APTT: in process  CBC: Hgb 11.6, Plt 166  SCr 0.86, CrCl ~ 47 ml/min   Goal of Therapy:  Heparin level 0.3-0.7 units/ml Monitor platelets by anticoagulation protocol: Yes   Plan:   Baseline APTT, PT/INR  Give heparin 2500 units bolus IV x 1  Start heparin IV infusion at 800 units/hr  Heparin level 8 hours after starting  Daily heparin level and CBC  Continue to monitor H&H and platelets  Follow up CTa, PCCM planning to D/C heparin if negative.  Gretta Arab PharmD, BCPS Pager [phone] 01/06/2015 4:38 PM

## 2015-01-06 NOTE — ED Notes (Signed)
Bed: WA08 Expected date:  Expected time:  Means of arrival:  Comments: 80 y/o F SOB

## 2015-01-07 ENCOUNTER — Inpatient Hospital Stay (HOSPITAL_COMMUNITY): Payer: Medicare Other

## 2015-01-07 DIAGNOSIS — J9601 Acute respiratory failure with hypoxia: Secondary | ICD-10-CM | POA: Insufficient documentation

## 2015-01-07 DIAGNOSIS — I369 Nonrheumatic tricuspid valve disorder, unspecified: Secondary | ICD-10-CM

## 2015-01-07 LAB — CBC
HCT: 28.9 % — ABNORMAL LOW (ref 36.0–46.0)
Hemoglobin: 10.2 g/dL — ABNORMAL LOW (ref 12.0–15.0)
MCH: 31.7 pg (ref 26.0–34.0)
MCHC: 35.3 g/dL (ref 30.0–36.0)
MCV: 89.8 fL (ref 78.0–100.0)
PLATELETS: 146 10*3/uL — AB (ref 150–400)
RBC: 3.22 MIL/uL — ABNORMAL LOW (ref 3.87–5.11)
RDW: 15.3 % (ref 11.5–15.5)
WBC: 3.9 10*3/uL — ABNORMAL LOW (ref 4.0–10.5)

## 2015-01-07 LAB — RESPIRATORY VIRUS PANEL
ADENOVIRUS: NOT DETECTED
INFLUENZA A H3: NOT DETECTED
INFLUENZA A: NOT DETECTED
INFLUENZA B 1: NOT DETECTED
Influenza A H1: NOT DETECTED
Metapneumovirus: NOT DETECTED
Parainfluenza 1: NOT DETECTED
Parainfluenza 2: NOT DETECTED
Parainfluenza 3: NOT DETECTED
RESPIRATORY SYNCYTIAL VIRUS A: NOT DETECTED
RESPIRATORY SYNCYTIAL VIRUS B: NOT DETECTED
Rhinovirus: NOT DETECTED

## 2015-01-07 LAB — TROPONIN I: Troponin I: 0.1 ng/mL — ABNORMAL HIGH (ref <0.031)

## 2015-01-07 LAB — BASIC METABOLIC PANEL
Anion gap: 5 (ref 5–15)
BUN: 19 mg/dL (ref 6–23)
CHLORIDE: 100 meq/L (ref 96–112)
CO2: 24 mmol/L (ref 19–32)
Calcium: 8 mg/dL — ABNORMAL LOW (ref 8.4–10.5)
Creatinine, Ser: 0.68 mg/dL (ref 0.50–1.10)
GFR calc Af Amer: >90 mL/min (ref >90)
GFR calc non Af Amer: 82 mL/min — ABNORMAL LOW (ref >90)
GLUCOSE: 170 mg/dL — AB (ref 70–99)
Potassium: 4 mmol/L (ref 3.5–5.1)
Sodium: 129 mmol/L — ABNORMAL LOW (ref 135–145)

## 2015-01-07 LAB — MAGNESIUM: Magnesium: 1.5 mg/dL (ref 1.5–2.5)

## 2015-01-07 LAB — PROCALCITONIN

## 2015-01-07 LAB — PHOSPHORUS: Phosphorus: 3.3 mg/dL (ref 2.3–4.6)

## 2015-01-07 LAB — OSMOLALITY, URINE: Osmolality, Ur: 579 mOsm/kg (ref 390–1090)

## 2015-01-07 MED ORDER — TAB-A-VITE/IRON PO TABS
1.0000 | ORAL_TABLET | Freq: Every day | ORAL | Status: DC
  Administered 2015-01-07 – 2015-01-13 (×7): 1 via ORAL
  Filled 2015-01-07 (×7): qty 1

## 2015-01-07 MED ORDER — FUROSEMIDE 10 MG/ML IJ SOLN
40.0000 mg | Freq: Two times a day (BID) | INTRAMUSCULAR | Status: AC
  Administered 2015-01-07 (×2): 40 mg via INTRAVENOUS
  Filled 2015-01-07 (×2): qty 4

## 2015-01-07 MED ORDER — MAGNESIUM SULFATE 2 GM/50ML IV SOLN
2.0000 g | Freq: Once | INTRAVENOUS | Status: AC
  Administered 2015-01-07: 2 g via INTRAVENOUS
  Filled 2015-01-07: qty 50

## 2015-01-07 MED ORDER — METHYLPREDNISOLONE SODIUM SUCC 40 MG IJ SOLR
INTRAMUSCULAR | Status: AC
  Filled 2015-01-07: qty 1

## 2015-01-07 MED ORDER — METHYLPREDNISOLONE SODIUM SUCC 125 MG IJ SOLR
80.0000 mg | Freq: Three times a day (TID) | INTRAMUSCULAR | Status: AC
  Administered 2015-01-07 (×2): 80 mg via INTRAVENOUS
  Filled 2015-01-07 (×2): qty 2

## 2015-01-07 MED ORDER — POTASSIUM CHLORIDE CRYS ER 20 MEQ PO TBCR
40.0000 meq | EXTENDED_RELEASE_TABLET | Freq: Once | ORAL | Status: AC
  Administered 2015-01-07: 40 meq via ORAL
  Filled 2015-01-07: qty 2

## 2015-01-07 MED ORDER — PREDNISONE 20 MG PO TABS: 60.0000 mg | ORAL_TABLET | Freq: Every day | ORAL | Status: DC

## 2015-01-07 NOTE — Progress Notes (Signed)
Name: Frances Gonzalez MRN: [id number] DOB: May 16, 1935    ADMISSION DATE:  01/06/2015  REFERRING MD :  Dr. Eulis Foster  CHIEF COMPLAINT:  SOB   BRIEF PATIENT DESCRIPTION: 80 y/o F with PMH of small cell Lung CA s/p chemo / XRT who presented 1/11 with a 2-3 wk hx of progressive SOB and profound hypoxemia.  PCCM consulted for evaluation.   SIGNIFICANT EVENTS  1/11  Admit with SOB, hypoxemia 1/12  O2 needs remain @ 15L, desaturates into mid 80's withVM  STUDIES:  CT Chest 1/11 >> neg for PE, diffuse increase in interstitial markings bilaterally    SUBJECTIVE: Pt reports "feeling well but my oxygen needs are still up".  RN notes pt desaturates to 86% on VM with activity as little as brushing her teeth.   VITAL SIGNS: Temp:  [98 F (36.7 C)-98.7 F (37.1 C)] 98 F (36.7 C) (01/12 0800) Pulse Rate:  [76-130] 102 (01/12 0600) Resp:  [14-27] 16 (01/12 0600) BP: (93-143)/(54-96) 141/76 mmHg (01/12 0600) SpO2:  [77 %-100 %] 98 % (01/12 0600) FiO2 (%):  [55 %] 55 % (01/11 1706) Weight:  [140 lb (63.504 kg)-140 lb 10.5 oz (63.8 kg)] 140 lb 10.5 oz (63.8 kg) (01/12 0500)  PHYSICAL EXAMINATION: General:  wdwn female in NAD Neuro:  AAOx4, speech clear, MAE HEENT:  Mm pink/moist, no jvd, ? Sclera icterus  Cardiovascular:  s1s2 mild tachy, no m/r/g Lungs:  resp's even/non-labored, lungs bilaterally with crackles lower posterior Abdomen:  Round/soft, bsx4 active  Musculoskeletal:  No acute deformities  Skin:  Warm/dry, no edema.  LE symmetrical    Recent Labs Lab 01/06/15 1506 01/06/15 1647 01/07/15 0435  NA 125* 125* 129*  K 4.0 4.1 4.0  CL 92* 92* 100  CO2 23 24 24   BUN 22 22 19   CREATININE 0.86 0.70 0.68  GLUCOSE 132* 134* 170*    Recent Labs Lab 01/06/15 1506 01/07/15 0435  HGB 11.6* 10.2*  HCT 33.6* 28.9*  WBC 9.5 3.9*  PLT 166 146*   Dg Chest 2 View (if Patient Has Fever And/or Copd)  01/06/2015   CLINICAL DATA:  Shortness of breath.  History of lung cancer.  EXAM:  CHEST  2 VIEW  COMPARISON:  CT scan 12/13/2014. Single view of the chest 12/13/2014. PA and lateral chest 12/31/2013.  FINDINGS: Right IJ approach Port-A-Cath remains in place. Surgical clips are again seen in the right lung apex Lung volumes are slightly lower than on the comparison exam. Heart size is mildly enlarged. The lungs are emphysematous with coarsened interstitium. No consolidative process, pneumothorax or effusion is identified.  IMPRESSION: Cardiomegaly and emphysema without acute disease.   Electronically Signed   By: Inge Rise M.D.   On: 01/06/2015 14:40   Ct Angio Chest Pe W/cm &/or Wo Cm  01/06/2015   CLINICAL DATA:  Hypoxemia. Increased and shortness of breath today. History of lung cancer.  EXAM: CT ANGIOGRAPHY CHEST WITH CONTRAST  TECHNIQUE: Multidetector CT imaging of the chest was performed using the standard protocol during bolus administration of intravenous contrast. Multiplanar CT image reconstructions and MIPs were obtained to evaluate the vascular anatomy.  CONTRAST:  181mL OMNIPAQUE IOHEXOL 350 MG/ML SOLN  COMPARISON:  Chest x-ray dated 01/06/2015 and CT angiogram dated 12/13/2014  FINDINGS: There are no pulmonary emboli. There is increased accentuation of the interstitial markings throughout both lungs as compared to the prior exam. Chronic emphysematous changes are stable. Heart size is normal. Ill-defined soft tissue density in the subcarinal  area is essentially unchanged and does have a mass effect upon the left atrium. This is unchanged.  There is a small pericardial effusion, unchanged. The main pulmonary arteries are slightly narrowed at their origins, unchanged.  No effusions. There is a small hiatal hernia, unchanged. Again noted is the left adrenal adenoma , stable.  Review of the MIP images confirms the above findings.  IMPRESSION: 1. No pulmonary emboli. 2. Diffuse increased accentuation of the interstitial markings in both lungs. This could represent slight  pulmonary edema or nonspecific pneumonitis.   Electronically Signed   By: Rozetta Nunnery M.D.   On: 01/06/2015 17:33   Dg Chest Port 1 View  01/07/2015   CLINICAL DATA:  80 year old female with history of high blood pressure and lung cancer presenting with hypoxia. Subsequent encounter.  EXAM: PORTABLE CHEST - 1 VIEW  COMPARISON:  01/06/2015 chest CT and chest x-ray. 12/31/2013 chest x-ray.  FINDINGS: Right MediPort catheter tip distal superior vena cava level. No gross pneumothorax.  Postsurgical changes right upper lobe.  Biapical pleural thickening greater on the right similar to prior exam.  Diffuse increased lung markings represent a change when compared 2015 and may represent pulmonary vascular congestion/pulmonary edema superimposed chronic lung changes. Nonspecific pneumonitis/ interstitial infiltrate not excluded. Interstitial spread of tumor felt to be a secondary less likely consideration. This is without significant change when compared to the recent chest x-ray taking into account differences in technique.  Heart size top-normal.  Calcified tortuous aorta.  IMPRESSION: Diffuse increased interstitial markings may represent pulmonary vascular congestion/ pulmonary edema superimposed upon chronic changes. Nonspecific pneumonitis as questioned on CT is also consideration. Appearance without significant change since recent chest x-ray (taking into account differences in technique).   Electronically Signed   By: Chauncey Cruel M.D.   On: 01/07/2015 06:57    ASSESSMENT / PLAN:  Acute Hypoxic Respiratory Failure - DDX Pneumonitis s/p XRT, Flu, CHF, underlying undiagnosed hypoxia in setting of chronic lung disease.  PE ruled out.  Small Cell Lung CA - s/p XRT COPD Former Tobacco Abuse  Plan: Continue albuterol, symbicort PPI to ensure to GERD contribution to sx, on at baseline Pulmonary hygiene Oxygen to support sats 88-95% Assess ECHO Elevated BNP (322) supportive of volume up Lasix 40 mg BID x  2 doses  Levaquin IV Prednisone 60 mg QD RVP  Hyponatremia  - admission Na 125, down from 132 on 12/19.  Rule out SIADH  Plan: Urine studies:  Na nml (21), Osmol nml (579), Cr  Lasix as above     HTN - normotensive on admission Elevated Troponin - likely demand ischemia, mild elevation with peak of 0.20 Hx Small Pericardial Effusion   Plan: Hold home medications  Await ECHO  Lovenox for DVT proph   GOC:  Discussed with patient regarding EOL needs and she desires for natural death.  Full medical care for now with discussion with patient regarding treatment plans.     Noe Gens, NP-C [facility] Pgr: [phone] or [phone]      01/07/2015, 9:07 AM

## 2015-01-07 NOTE — Progress Notes (Signed)
  Echocardiogram 2D Echocardiogram has been performed.  Darlina Sicilian M 01/07/2015, 9:32 AM

## 2015-01-08 ENCOUNTER — Inpatient Hospital Stay (HOSPITAL_COMMUNITY): Payer: Medicare Other

## 2015-01-08 LAB — BASIC METABOLIC PANEL
ANION GAP: 11 (ref 5–15)
BUN: 29 mg/dL — ABNORMAL HIGH (ref 6–23)
CALCIUM: 8.3 mg/dL — AB (ref 8.4–10.5)
CO2: 27 mmol/L (ref 19–32)
Chloride: 94 mEq/L — ABNORMAL LOW (ref 96–112)
Creatinine, Ser: 0.9 mg/dL (ref 0.50–1.10)
GFR, EST AFRICAN AMERICAN: 70 mL/min — AB (ref >90)
GFR, EST NON AFRICAN AMERICAN: 60 mL/min — AB (ref >90)
Glucose, Bld: 168 mg/dL — ABNORMAL HIGH (ref 70–99)
Potassium: 3.1 mmol/L — ABNORMAL LOW (ref 3.5–5.1)
Sodium: 132 mmol/L — ABNORMAL LOW (ref 135–145)

## 2015-01-08 LAB — CBC
HEMATOCRIT: 31.2 % — AB (ref 36.0–46.0)
Hemoglobin: 10.8 g/dL — ABNORMAL LOW (ref 12.0–15.0)
MCH: 31.2 pg (ref 26.0–34.0)
MCHC: 34.6 g/dL (ref 30.0–36.0)
MCV: 90.2 fL (ref 78.0–100.0)
Platelets: 179 10*3/uL (ref 150–400)
RBC: 3.46 MIL/uL — AB (ref 3.87–5.11)
RDW: 15.3 % (ref 11.5–15.5)
WBC: 8.2 10*3/uL (ref 4.0–10.5)

## 2015-01-08 LAB — MAGNESIUM: Magnesium: 2 mg/dL (ref 1.5–2.5)

## 2015-01-08 LAB — PROCALCITONIN: Procalcitonin: <0.1 ng/mL

## 2015-01-08 MED ORDER — POTASSIUM CHLORIDE CRYS ER 20 MEQ PO TBCR
40.0000 meq | EXTENDED_RELEASE_TABLET | Freq: Once | ORAL | Status: AC
  Administered 2015-01-08: 40 meq via ORAL
  Filled 2015-01-08: qty 2

## 2015-01-08 MED ORDER — FUROSEMIDE 10 MG/ML IJ SOLN
40.0000 mg | Freq: Two times a day (BID) | INTRAMUSCULAR | Status: AC
  Administered 2015-01-08 – 2015-01-09 (×2): 40 mg via INTRAVENOUS
  Filled 2015-01-08 (×2): qty 4

## 2015-01-08 MED ORDER — IRBESARTAN 75 MG PO TABS
75.0000 mg | ORAL_TABLET | Freq: Every day | ORAL | Status: DC
  Administered 2015-01-08 – 2015-01-13 (×5): 75 mg via ORAL
  Filled 2015-01-08 (×6): qty 1

## 2015-01-08 NOTE — Progress Notes (Signed)
Name: Frances Gonzalez MRN: [id number] DOB: October 26, 1935    ADMISSION DATE:  01/06/2015  REFERRING MD :  Dr. Eulis Foster  CHIEF COMPLAINT:  SOB   BRIEF PATIENT DESCRIPTION: 80 y/o F with PMH of small cell Lung CA s/p chemo / XRT who presented 1/11 with a 2-3 wk hx of progressive SOB and profound hypoxemia.  PCCM consulted for evaluation.   SIGNIFICANT EVENTS  1/11  Admit with SOB, hypoxemia 1/12  O2 needs remain @ 15L, desaturates into mid 80's withVM  STUDIES:  CT Chest 1/11 >> neg for PE, diffuse increase in interstitial markings bilaterally CXR 1/13 >> diffuse interstitial lung disease   SUBJECTIVE: Net neg 2l with lasix. O2 reduced to 10L/45%.  Continues to drop into mid 80's with exertion. No acute events.   VITAL SIGNS: Temp:  [97.3 F (36.3 C)-98.2 F (36.8 C)] 97.8 F (36.6 C) (01/13 0500) Pulse Rate:  [107-120] 109 (01/12 2300) Resp:  [14-26] 19 (01/12 2300) BP: (111-147)/(48-92) 128/92 mmHg (01/12 2200) SpO2:  [92 %-100 %] 96 % (01/13 0750) Weight:  [137 lb 2 oz (62.2 kg)] 137 lb 2 oz (62.2 kg) (01/13 0500)  PHYSICAL EXAMINATION: General:  wdwn female in NAD Neuro:  AAOx4, speech clear, MAE HEENT:  Mm pink/moist, no jvd, ? Sclera icterus  Cardiovascular:  s1s2 mild tachy, no m/r/g Lungs:  resp's even/non-labored, lungs bilaterally with crackles lower posterior  Abdomen:  Round/soft, bsx4 active  Musculoskeletal:  No acute deformities  Skin:  Warm/dry, no edema.  LE symmetrical    Recent Labs Lab 01/06/15 1647 01/07/15 0435 01/08/15 0710  NA 125* 129* 132*  K 4.1 4.0 3.1*  CL 92* 100 94*  CO2 24 24 27   BUN 22 19 29*  CREATININE 0.70 0.68 0.90  GLUCOSE 134* 170* 168*    Recent Labs Lab 01/06/15 1506 01/07/15 0435 01/08/15 0710  HGB 11.6* 10.2* 10.8*  HCT 33.6* 28.9* 31.2*  WBC 9.5 3.9* 8.2  PLT 166 146* 179   Dg Chest 2 View (if Patient Has Fever And/or Copd)  01/06/2015   CLINICAL DATA:  Shortness of breath.  History of lung cancer.  EXAM: CHEST   2 VIEW  COMPARISON:  CT scan 12/13/2014. Single view of the chest 12/13/2014. PA and lateral chest 12/31/2013.  FINDINGS: Right IJ approach Port-A-Cath remains in place. Surgical clips are again seen in the right lung apex Lung volumes are slightly lower than on the comparison exam. Heart size is mildly enlarged. The lungs are emphysematous with coarsened interstitium. No consolidative process, pneumothorax or effusion is identified.  IMPRESSION: Cardiomegaly and emphysema without acute disease.   Electronically Signed   By: Inge Rise M.D.   On: 01/06/2015 14:40   Ct Angio Chest Pe W/cm &/or Wo Cm  01/06/2015   CLINICAL DATA:  Hypoxemia. Increased and shortness of breath today. History of lung cancer.  EXAM: CT ANGIOGRAPHY CHEST WITH CONTRAST  TECHNIQUE: Multidetector CT imaging of the chest was performed using the standard protocol during bolus administration of intravenous contrast. Multiplanar CT image reconstructions and MIPs were obtained to evaluate the vascular anatomy.  CONTRAST:  14mL OMNIPAQUE IOHEXOL 350 MG/ML SOLN  COMPARISON:  Chest x-ray dated 01/06/2015 and CT angiogram dated 12/13/2014  FINDINGS: There are no pulmonary emboli. There is increased accentuation of the interstitial markings throughout both lungs as compared to the prior exam. Chronic emphysematous changes are stable. Heart size is normal. Ill-defined soft tissue density in the subcarinal area is essentially unchanged and does  have a mass effect upon the left atrium. This is unchanged.  There is a small pericardial effusion, unchanged. The main pulmonary arteries are slightly narrowed at their origins, unchanged.  No effusions. There is a small hiatal hernia, unchanged. Again noted is the left adrenal adenoma , stable.  Review of the MIP images confirms the above findings.  IMPRESSION: 1. No pulmonary emboli. 2. Diffuse increased accentuation of the interstitial markings in both lungs. This could represent slight pulmonary  edema or nonspecific pneumonitis.   Electronically Signed   By: Rozetta Nunnery M.D.   On: 01/06/2015 17:33   Dg Chest Port 1 View  01/08/2015   CLINICAL DATA:  Respiratory failure.  EXAM: PORTABLE CHEST - 1 VIEW  COMPARISON:  01/07/2015, 04/13/2011.  Chest x-ray 12/13/2014.  FINDINGS: Power port catheter in stable position. Cardiomegaly. Normal pulmonary vascularity. Diffuse pulmonary interstitial changes are again noted. Findings are most consistent chronic interstitial lung disease. Active pneumonitis cannot be excluded. No pleural effusion or pneumothorax. Right apical pleural parenchymal thickening again noted consistent with scarring. Surgical clips upper chest. No pneumothorax. No acute bony abnormality.  IMPRESSION: 1. Power port catheter stable position. 2. Stable cardiomegaly. 3. Chronic interstitial lung disease again noted. Underlying active pneumonitis cannot be excluded . Right apical pleural parenchymal scarring.   Electronically Signed   By: Marcello Moores  Register   On: 01/08/2015 07:30   Dg Chest Port 1 View  01/07/2015   CLINICAL DATA:  80 year old female with history of high blood pressure and lung cancer presenting with hypoxia. Subsequent encounter.  EXAM: PORTABLE CHEST - 1 VIEW  COMPARISON:  01/06/2015 chest CT and chest x-ray. 12/31/2013 chest x-ray.  FINDINGS: Right MediPort catheter tip distal superior vena cava level. No gross pneumothorax.  Postsurgical changes right upper lobe.  Biapical pleural thickening greater on the right similar to prior exam.  Diffuse increased lung markings represent a change when compared 2015 and may represent pulmonary vascular congestion/pulmonary edema superimposed chronic lung changes. Nonspecific pneumonitis/ interstitial infiltrate not excluded. Interstitial spread of tumor felt to be a secondary less likely consideration. This is without significant change when compared to the recent chest x-ray taking into account differences in technique.  Heart size  top-normal.  Calcified tortuous aorta.  IMPRESSION: Diffuse increased interstitial markings may represent pulmonary vascular congestion/ pulmonary edema superimposed upon chronic changes. Nonspecific pneumonitis as questioned on CT is also consideration. Appearance without significant change since recent chest x-ray (taking into account differences in technique).   Electronically Signed   By: Chauncey Cruel M.D.   On: 01/07/2015 06:57    ASSESSMENT / PLAN:  Acute Hypoxic Respiratory Failure - DDX Pneumonitis s/p XRT, underlying undiagnosed hypoxia in setting of chronic lung disease.  PE, flu and cardiac source ruled out.  Small Cell Lung CA - s/p XRT COPD Former Tobacco Abuse - smoked ~ 50 years, 1ppd   Plan: Continue albuterol, symbicort PPI to ensure to GERD contribution to sx, on at baseline Pulmonary hygiene Oxygen to support sats 88-95% Assess ECHO Repeat BNP in am, consider further lasix if remains up Levaquin IV Solu-medrol 80 Q8 RVP negative    Hyponatremia  - admission Na 125, down from 132 on 12/19.  Rule out SIADH Hypokalemia   Plan: Urine studies:  Na nml (21), Osmol nml (579), Cr  Replace electrolytes as indicated   HTN - normotensive on admission Elevated Troponin - likely demand ischemia, mild elevation with peak of 0.20 Hx Small Pericardial Effusion  Grade I DD - noted  on ECHO, otherwise essentially nml  Plan: Hold home medications  Lovenox for DVT proph   GOC:  Discussed with patient regarding EOL needs and she desires for natural death.  Full medical care for now with discussion with patient regarding treatment plans.     Noe Gens, NP-C [facility] Pgr: [phone] or [phone]      01/08/2015, 8:42 AM

## 2015-01-08 NOTE — Care Management Note (Addendum)
    Page 1 of 1   01/09/2015     1:33:40 PM CARE MANAGEMENT NOTE 01/09/2015  Patient:  LEALA, BRYAND   Account Number:  [id number]  Date Initiated:  01/08/2015  Documentation initiated by:  Dessa Phi  Subjective/Objective Assessment:   80 y/o f admitted w/hypoxemia.     Action/Plan:   From home.Has oncologist as pcp.Has pharmacy.   Anticipated DC Date:  01/13/2015   Anticipated DC Plan:  [place]  CM consult      Choice offered to / List presented to:             Status of service:  In process, will continue to follow Medicare Important Message given?  YES (If response is "NO", the following Medicare IM given date fields will be blank) Date Medicare IM given:  01/09/2015 Medicare IM given by:  [facility] Date Additional Medicare IM given:   Additional Medicare IM given by:    Discharge Disposition:    Per UR Regulation:  Reviewed for med. necessity/level of care/duration of stay  If discussed at [place] of Stay Meetings, dates discussed:    Comments:  01/09/15 Dessa Phi RN BSN NCM [phone] Transferred from [place].If home 02 needed can arrange.  01/08/15 Dessa Phi RN BSN NCM [phone] If home 02 needed can arrange if has qualifying 02 sats within 48hrs prior to d/c.

## 2015-01-09 LAB — BASIC METABOLIC PANEL
ANION GAP: 11 (ref 5–15)
BUN: 32 mg/dL — ABNORMAL HIGH (ref 6–23)
CHLORIDE: 94 meq/L — AB (ref 96–112)
CO2: 29 mmol/L (ref 19–32)
CREATININE: 0.86 mg/dL (ref 0.50–1.10)
Calcium: 8.7 mg/dL (ref 8.4–10.5)
GFR calc Af Amer: 74 mL/min — ABNORMAL LOW (ref >90)
GFR calc non Af Amer: 64 mL/min — ABNORMAL LOW (ref >90)
Glucose, Bld: 112 mg/dL — ABNORMAL HIGH (ref 70–99)
POTASSIUM: 3.1 mmol/L — AB (ref 3.5–5.1)
Sodium: 134 mmol/L — ABNORMAL LOW (ref 135–145)

## 2015-01-09 LAB — CBC
HCT: 32.8 % — ABNORMAL LOW (ref 36.0–46.0)
Hemoglobin: 11.2 g/dL — ABNORMAL LOW (ref 12.0–15.0)
MCH: 30.9 pg (ref 26.0–34.0)
MCHC: 34.1 g/dL (ref 30.0–36.0)
MCV: 90.4 fL (ref 78.0–100.0)
Platelets: 185 10*3/uL (ref 150–400)
RBC: 3.63 MIL/uL — ABNORMAL LOW (ref 3.87–5.11)
RDW: 15.4 % (ref 11.5–15.5)
WBC: 10.6 10*3/uL — AB (ref 4.0–10.5)

## 2015-01-09 LAB — BRAIN NATRIURETIC PEPTIDE: B Natriuretic Peptide: 86.1 pg/mL (ref 0.0–100.0)

## 2015-01-09 LAB — CREATININE, URINE, 24 HOUR
CREATININE 24H UR: 695 mg/d — AB (ref 700–1800)
CREATININE, URINE: 25.7 mg/dL
Collection Interval-UCRE24: 24 hours
URINE TOTAL VOLUME-UCRE24: 2700 mL

## 2015-01-09 LAB — PROCALCITONIN: Procalcitonin: <0.1 ng/mL

## 2015-01-09 MED ORDER — CEPHALEXIN 500 MG PO CAPS
500.0000 mg | ORAL_CAPSULE | Freq: Three times a day (TID) | ORAL | Status: AC
  Administered 2015-01-09 – 2015-01-10 (×3): 500 mg via ORAL
  Filled 2015-01-09 (×3): qty 1

## 2015-01-09 MED ORDER — PREDNISONE 20 MG PO TABS
40.0000 mg | ORAL_TABLET | Freq: Every day | ORAL | Status: DC
  Administered 2015-01-09 – 2015-01-13 (×5): 40 mg via ORAL
  Filled 2015-01-09 (×7): qty 2

## 2015-01-09 MED ORDER — POTASSIUM CHLORIDE CRYS ER 20 MEQ PO TBCR
40.0000 meq | EXTENDED_RELEASE_TABLET | Freq: Once | ORAL | Status: AC
  Administered 2015-01-09: 40 meq via ORAL
  Filled 2015-01-09: qty 2

## 2015-01-09 MED ORDER — SODIUM CHLORIDE 0.9 % IV BOLUS (SEPSIS)
500.0000 mL | Freq: Once | INTRAVENOUS | Status: AC
  Administered 2015-01-09: 500 mL via INTRAVENOUS

## 2015-01-09 MED ORDER — SODIUM CHLORIDE 0.9 % IV SOLN: INTRAVENOUS | Status: DC

## 2015-01-09 NOTE — Telephone Encounter (Signed)
Dr. Melvyn Novas, pt currently admitted.  Please advise if msg can be closed.  Thank you.

## 2015-01-09 NOTE — Progress Notes (Signed)
CARE MANAGEMENT NOTE 01/09/2015  Patient:  SAMERA, MACY   Account Number:  [id number]  Date Initiated:  01/08/2015  Documentation initiated by:  Dessa Phi  Subjective/Objective Assessment:   80 y/o f admitted w/hypoxemia.     Action/Plan:   From home.Has oncologist as pcp.Has pharmacy.   Anticipated DC Date:  01/13/2015   Anticipated DC Plan:  [place]  CM consult      Choice offered to / List presented to:             Status of service:  In process, will continue to follow Medicare Important Message given?  YES (If response is "NO", the following Medicare IM given date fields will be blank) Date Medicare IM given:  01/09/2015 Medicare IM given by:  [facility] Date Additional Medicare IM given:   Additional Medicare IM given by:    Discharge Disposition:    Per UR Regulation:  Reviewed for med. necessity/level of care/duration of stay  If discussed at [place] of Stay Meetings, dates discussed:    Comments:  01/09/15 Edwyna Shell RN BSN CM 2 [phone] Transferred from [place]. Patient stated that she lives at home, in a condo surrounded by 11 other units and receives a lot of support from her neighbors and is also supported by her sister. She does not know if she will go directly home upon dc or if she will stay with her sister for a short period of time prior to returning home. Patient is aware that she may need home O2 at time of dc, she does not have any home DME and does not feel that she needs any HH services but will defintiely be open to [facility] if MD feels appropriate. Will continue to follow for dc planning needs.  01/09/15 Dessa Phi RN BSN NCM [phone] Transferred from [place].If home 02 needed can arrange.  01/08/15 Dessa Phi RN BSN NCM [phone] If home 02 needed can arrange if has qualifying 02 sats within 48hrs prior to d/c.

## 2015-01-09 NOTE — Progress Notes (Signed)
Name: Frances Gonzalez MRN: [id number] DOB: 07/15/1935    ADMISSION DATE:  01/06/2015  REFERRING MD :  Dr. Eulis Foster  CHIEF COMPLAINT:  SOB   BRIEF PATIENT DESCRIPTION: 80 y/o F with PMH of small cell Lung CA s/p chemo / XRT who presented 1/11 with a 2-3 wk hx of progressive SOB and profound hypoxemia.  PCCM consulted for evaluation.   SIGNIFICANT EVENTS  1/11  Admit with SOB, hypoxemia 1/12  O2 needs remain @ 15L, desaturates into mid 80's withVM 1/13  O2 reduced to 45% / 10L 1/14  O2 down to 6L, BP mildly soft  STUDIES:  CT Chest 1/11 >> neg for PE, diffuse increase in interstitial markings bilaterally CXR 1/13 >> diffuse interstitial lung disease   SUBJECTIVE: O2 to 6L, desaturations with activity.  Questioning when she might be able to go home.    VITAL SIGNS: Temp:  [97.4 F (36.3 C)-98.6 F (37 C)] 97.4 F (36.3 C) (01/14 0335) Pulse Rate:  [100-124] 103 (01/14 0400) Resp:  [11-26] 24 (01/14 0400) BP: (103-161)/(65-112) 103/78 mmHg (01/14 0400) SpO2:  [83 %-97 %] 90 % (01/14 0805)  PHYSICAL EXAMINATION: General:  wdwn female in NAD Neuro:  AAOx4, speech clear, MAE HEENT:  Mm pink/moist, no jvd, ? Sclera icterus  Cardiovascular:  s1s2 mild tachy, no m/r/g Lungs:  resp's even/non-labored, lungs bilaterally with crackles lower posterior (unchanged despite lasix) Abdomen:  Round/soft, bsx4 active  Musculoskeletal:  No acute deformities  Skin:  Warm/dry, no edema.  LE symmetrical    Recent Labs Lab 01/07/15 0435 01/08/15 0710 01/09/15 0400  NA 129* 132* 134*  K 4.0 3.1* 3.1*  CL 100 94* 94*  CO2 24 27 29   BUN 19 29* 32*  CREATININE 0.68 0.90 0.86  GLUCOSE 170* 168* 112*    Recent Labs Lab 01/07/15 0435 01/08/15 0710 01/09/15 0400  HGB 10.2* 10.8* 11.2*  HCT 28.9* 31.2* 32.8*  WBC 3.9* 8.2 10.6*  PLT 146* 179 185   Dg Chest Port 1 View  01/08/2015   CLINICAL DATA:  Respiratory failure.  EXAM: PORTABLE CHEST - 1 VIEW  COMPARISON:  01/07/2015,  04/13/2011.  Chest x-ray 12/13/2014.  FINDINGS: Power port catheter in stable position. Cardiomegaly. Normal pulmonary vascularity. Diffuse pulmonary interstitial changes are again noted. Findings are most consistent chronic interstitial lung disease. Active pneumonitis cannot be excluded. No pleural effusion or pneumothorax. Right apical pleural parenchymal thickening again noted consistent with scarring. Surgical clips upper chest. No pneumothorax. No acute bony abnormality.  IMPRESSION: 1. Power port catheter stable position. 2. Stable cardiomegaly. 3. Chronic interstitial lung disease again noted. Underlying active pneumonitis cannot be excluded . Right apical pleural parenchymal scarring.   Electronically Signed   By: Marcello Moores  Register   On: 01/08/2015 07:30    ASSESSMENT / PLAN:  Acute Hypoxic Respiratory Failure - DDX Pneumonitis s/p XRT, underlying undiagnosed hypoxia in setting of chronic lung disease.  PE, flu and cardiac source ruled out.  Small Cell Lung CA - s/p XRT COPD Former Tobacco Abuse - smoked ~ 50 years, 1ppd   Plan: Continue albuterol, symbicort PPI to ensure to GERD contribution to sx, on at baseline Pulmonary hygiene Oxygen to support sats 88-95% Levaquin IV, D4/x Solu-medrol 80 Q8, consider reduction  RVP negative  Will need eval for O2 prior to discharge  PT eval for home needs   Hyponatremia  - admission Na 125, down from 132 on 12/19.   Hypokalemia   Plan: Urine studies:  Na nml (  21), Osmol nml (579), Cr  Replace electrolytes as indicated  40 KCL x 1   HTN - normotensive on admission.   Elevated Troponin - likely demand ischemia, mild elevation with peak of 0.20 Hx Small Pericardial Effusion  Grade I DD - noted on ECHO, otherwise essentially nml  Plan: Continue irbesartan - Mild soft bp 1/14, may need to hold.  Likely dry from diuresis, 518ml bolus x1 Lovenox for DVT proph   GOC:  Discussed with patient regarding EOL needs and she desires for natural  death.  Full medical care for now with discussion with patient regarding treatment plans.     Noe Gens, NP-C [facility] Pgr: [phone] or [phone]      01/09/2015, 8:19 AM

## 2015-01-09 NOTE — Evaluation (Signed)
Physical Therapy Evaluation Patient Details Name: Frances Gonzalez MRN: [id number] DOB: 02/13/1935 Today's Date: 01/09/2015   History of Present Illness  80 year old female with PMH of small cell Lung CA s/p chemo/XRT admitted 1/11 with a 2-3 wk hx of progressive SOB and profound hypoxemia.    Clinical Impression  Pt admitted with above diagnosis. Pt currently with functional limitations due to the deficits listed below (see PT Problem List).  Pt will benefit from skilled PT to increase their independence and safety with mobility to allow discharge to the venue listed below.   Pt very limited by DOE, requiring 8L O2  to maintain saturations at 91% during ambulation (on 6L O2 at rest).  Discussed pt taking rest breaks as needed and educated on pursed lip breathing technique.  Pt mostly limited by DOE however reports upon d/c she plans to stay with her sister.     Follow Up Recommendations Home health PT    Equipment Recommendations  Rolling walker with 5" wheels    Recommendations for Other Services       Precautions / Restrictions Precautions Precaution Comments: check O2 Restrictions Weight Bearing Restrictions: No      Mobility  Bed Mobility Overal bed mobility: Needs Assistance Bed Mobility: Supine to Sit;Sit to Supine     Supine to sit: Supervision;HOB elevated Sit to supine: Supervision;HOB elevated      Transfers Overall transfer level: Needs assistance Equipment used: None Transfers: Sit to/from Stand Sit to Stand: Min guard            Ambulation/Gait Ambulation/Gait assistance: Min guard Ambulation Distance (Feet): 40 Feet Assistive device: None Gait Pattern/deviations: Step-through pattern;Decreased stride length Gait velocity: decr   General Gait Details: slow pace, held onto hand rail occasionally, 2 standing rest breaks for pursed lip breathing, SpO2 86% on 6L O2, increased to 8L O2 with SpO2 91% upon return to room, dyspnea 3/4  Stairs             Wheelchair Mobility    Modified Rankin (Stroke Patients Only)       Balance                                             Pertinent Vitals/Pain Pain Assessment: No/denies pain    Home Living Family/patient expects to be discharged to:: Private residence Living Arrangements: Alone Available Help at Discharge: Family (sister) Type of Home: House       Home Layout: One level Home Equipment: None Additional Comments: plans to stay at sister's home upon d/c    Prior Function Level of Independence: Independent               Hand Dominance        Extremity/Trunk Assessment               Lower Extremity Assessment: Overall WFL for tasks assessed      Cervical / Trunk Assessment: Normal  Communication   Communication: No difficulties  Cognition Arousal/Alertness: Awake/alert Behavior During Therapy: WFL for tasks assessed/performed Overall Cognitive Status: Within Functional Limits for tasks assessed                      General Comments      Exercises        Assessment/Plan    PT Assessment Patient needs continued PT services  PT Diagnosis Difficulty walking   PT Problem List Cardiopulmonary status limiting activity;Decreased mobility;Decreased activity tolerance  PT Treatment Interventions DME instruction;Gait training;Functional mobility training;Patient/family education;Therapeutic activities;Therapeutic exercise   PT Goals (Current goals can be found in the Care Plan section) Acute Rehab PT Goals PT Goal Formulation: With patient Time For Goal Achievement: 01/16/15 Potential to Achieve Goals: Good    Frequency Min 3X/week   Barriers to discharge        Co-evaluation               End of Session Equipment Utilized During Treatment: Oxygen Activity Tolerance: Other (comment) (limited by DOE) Patient left: with call bell/phone within reach;in bed           Time: 8889-1694 PT Time  Calculation (min) (ACUTE ONLY): 15 min   Charges:   PT Evaluation $Initial PT Evaluation Tier I: 1 Procedure PT Treatments $Gait Training: 8-22 mins   PT G Codes:        Karlyn Glasco,KATHrine E 01/09/2015, 3:20 PM Carmelia Bake, PT, DPT 01/09/2015 Pager: [phone]

## 2015-01-10 ENCOUNTER — Inpatient Hospital Stay (HOSPITAL_COMMUNITY): Payer: Medicare Other

## 2015-01-10 LAB — BASIC METABOLIC PANEL
Anion gap: 9 (ref 5–15)
BUN: 24 mg/dL — AB (ref 6–23)
CO2: 27 mmol/L (ref 19–32)
Calcium: 8.6 mg/dL (ref 8.4–10.5)
Chloride: 98 mEq/L (ref 96–112)
Creatinine, Ser: 0.64 mg/dL (ref 0.50–1.10)
GFR calc Af Amer: >90 mL/min (ref >90)
GFR calc non Af Amer: 84 mL/min — ABNORMAL LOW (ref >90)
Glucose, Bld: 131 mg/dL — ABNORMAL HIGH (ref 70–99)
Potassium: 3.8 mmol/L (ref 3.5–5.1)
SODIUM: 134 mmol/L — AB (ref 135–145)

## 2015-01-10 LAB — CBC
HCT: 28.7 % — ABNORMAL LOW (ref 36.0–46.0)
HEMOGLOBIN: 9.8 g/dL — AB (ref 12.0–15.0)
MCH: 30.9 pg (ref 26.0–34.0)
MCHC: 34.1 g/dL (ref 30.0–36.0)
MCV: 90.5 fL (ref 78.0–100.0)
PLATELETS: 163 10*3/uL (ref 150–400)
RBC: 3.17 MIL/uL — ABNORMAL LOW (ref 3.87–5.11)
RDW: 15.5 % (ref 11.5–15.5)
WBC: 4.8 10*3/uL (ref 4.0–10.5)

## 2015-01-10 MED ORDER — FUROSEMIDE 10 MG/ML IJ SOLN
20.0000 mg | Freq: Once | INTRAMUSCULAR | Status: AC
  Administered 2015-01-10: 20 mg via INTRAVENOUS
  Filled 2015-01-10: qty 2

## 2015-01-10 MED ORDER — LIP MEDEX EX OINT
TOPICAL_OINTMENT | CUTANEOUS | Status: AC
  Administered 2015-01-10: 22:00:00
  Filled 2015-01-10: qty 7

## 2015-01-10 MED ORDER — SODIUM CHLORIDE 0.9 % IJ SOLN
10.0000 mL | INTRAMUSCULAR | Status: DC | PRN
  Administered 2015-01-10 – 2015-01-13 (×3): 10 mL
  Filled 2015-01-10 (×2): qty 40

## 2015-01-10 NOTE — Progress Notes (Signed)
Spoke with MD about saline lock IV. Patient has portacath and must be deaccessed to be saline locked.  MD stated okay to keep fluids running at 73ml/hr to keep port accessible.

## 2015-01-10 NOTE — Progress Notes (Signed)
Name: Frances Gonzalez MRN: [id number] DOB: [birth year]    ADMISSION DATE:  01/06/2015  REFERRING MD :  Dr. Eulis Foster  CHIEF COMPLAINT:  SOB   BRIEF PATIENT DESCRIPTION: 80 y/o F with PMH of small cell Lung CA s/p chemo / XRT who presented 1/11 with a 2-3 wk hx of progressive SOB and profound hypoxemia.  PCCM consulted for evaluation.   SIGNIFICANT EVENTS  1/11  Admit with SOB, hypoxemia: CT Chest 1/11 >> neg for PE, diffuse increase in interstitial markings bilaterally 1/12  O2 needs remain @ 15L, desaturates into mid 80's withVM 1/13  O2 reduced to 45% / 10L: CXR 1/13 >> diffuse interstitial lung disease 1/14  O2 down to 6L, BP mildly softy.  Questioning when she might be able to go home.    SUBJECTIVE/OVERNIGHT/INTERVAL HX 01/10/2015 RN reports easy desaturations even on 6L with minimal exertion. On 2LNC in 5 minutes - 89% at rest.         VITAL SIGNS: Temp:  [97.6 F (36.4 C)-98.1 F (36.7 C)] 97.7 F (36.5 C) (01/15 0526) Pulse Rate:  [97-132] 101 (01/15 0740) Resp:  [18-22] 18 (01/15 0526) BP: (94-121)/(54-75) 121/75 mmHg (01/15 0526) SpO2:  [85 %-92 %] 90 % (01/15 0826)  PHYSICAL EXAMINATION: General:  wdwn female in NAD Neuro:  AAOx4, speech clear, MAE HEENT:  Mm pink/moist, no jvd, ? Sclera icterus  Cardiovascular:  s1s2 mild tachy, no m/r/g Lungs:  resp's even/non-labored, lungs bilaterally with crackles lower posterior seem better since admit - mild Abdomen:  Round/soft, bsx4 active  Musculoskeletal:  No acute deformities  Skin:  Warm/dry, no edema.  LE symmetrical    PULMONARY No results for input(s): PHART, PCO2ART, PO2ART, HCO3, TCO2, O2SAT in the last 168 hours.  Invalid input(s): PCO2, PO2  CBC  Recent Labs Lab 01/08/15 0710 01/09/15 0400 01/10/15 0430  HGB 10.8* 11.2* 9.8*  HCT 31.2* 32.8* 28.7*  WBC 8.2 10.6* 4.8  PLT 179 185 163    COAGULATION  Recent Labs Lab 01/06/15 1647  INR 1.07    CARDIAC   Recent Labs Lab 01/06/15 1506  01/06/15 1647 01/06/15 2205 01/07/15 0435  TROPONINI 0.20* 0.20* 0.14* 0.10*   No results for input(s): PROBNP in the last 168 hours.   CHEMISTRY  Recent Labs Lab 01/06/15 1647 01/07/15 0435 01/08/15 0710 01/09/15 0400 01/10/15 0430  NA 125* 129* 132* 134* 134*  K 4.1 4.0 3.1* 3.1* 3.8  CL 92* 100 94* 94* 98  CO2 24 24 27 29 27   GLUCOSE 134* 170* 168* 112* 131*  BUN 22 19 29* 32* 24*  CREATININE 0.70 0.68 0.90 0.86 0.64  CALCIUM 8.3* 8.0* 8.3* 8.7 8.6  MG 1.4* 1.5 2.0  --   --   PHOS 4.2 3.3  --   --   --    Estimated Creatinine Clearance: 49.8 mL/min (by C-G formula based on Cr of 0.64).   LIVER  Recent Labs Lab 01/06/15 1647  AST 35  ALT 17  ALKPHOS 68  BILITOT 1.1  PROT 6.5  ALBUMIN 3.4*  INR 1.07     INFECTIOUS  Recent Labs Lab 01/06/15 1647 01/07/15 1120 01/08/15 0710 01/09/15 0400  LATICACIDVEN 1.4  --   --   --   PROCALCITON  --  <0.10 <0.10 <0.10     ENDOCRINE CBG (last 3)  No results for input(s): GLUCAP in the last 72 hours.       IMAGING x48h Dg Chest Port 1 View  01/10/2015  CLINICAL DATA:  Respiratory failure.  EXAM: PORTABLE CHEST - 1 VIEW  COMPARISON:  01/08/2015.  12/31/2013.  FINDINGS: Heart 4 catheter stable position. Stable cardiomegaly with normal pulmonary vascularity. Progressive bilateral interstitial prominence noted. This is consistent with active interstitial disease such as interstitial pneumonitis and/or edema. Underlying chronic interstitial disease is present. No pleural effusion or pneumothorax. No acute bony abnormality.  IMPRESSION: 1. Progressive bilateral pleural interstitial prominence consistent with active interstitial disease as is interstitial pneumonitis or interstitial pulmonary edema. Underlying chronic interstitial disease is present. 2. Stable cardiomegaly.  No pulmonary venous congestion. 3. Port-A-Cath in stable position.   Electronically Signed   By: Marcello Moores  Register   On: 01/10/2015 07:22         ASSESSMENT / LAN:  Acute Hypoxic Respiratory Failure - DDX Pneumonitis s/p XRT, underlying undiagnosed hypoxia in setting of chronic lung disease.  PE, flu and cardiac source ruled out.  Small Cell Lung CA - s/p XRT COPD Former Tobacco Abuse - smoked ~ 50 years, 1ppd   01/10/2015: continued hypoxemia - on 6L . CXR possibly worse. Did have problems afer lasix 2d ago with mild low bp though it seemed to improved hypoxemia   Plan: O2 for pulse ox > 88% (if does not improve concern for lymphangitis carcinomatosis) Continue albuterol, symbicort PPI to ensure to GERD contribution to sx, on at baseline Pulmonary hygiene Oxygen to support sats 88-95% (RN asked tto titrate on 01/10/2015)  PO prednisone 40mg  per day since 01/09/15 x 2 weeks and the slow taper over nex 3-12 weeks for possible XRT pneumonitis Stop antibiptics 01/10/2015 - PCT negative  Lasix x 1 empiric and reassess 01/11/15     Will need eval for O2 prior to discharge  PT eval for home needs   C VS  # - HTN Elevated Troponin - likely demand ischemia, mild elevation with peak of 0.20 Hx Small Pericardial Effusion   #At admit Trop leak - likely sterss NSTEMI - Grade I DD - noted on ECHO, otherwise essentially nml  Plan: Continue irbesartan - Lovenox for DVT proph Lasix 20mg  IV x 1   GOC:  DNR but full medical care. Palliation if worsens  DISOPO - cannot go home for several days till hypoxemia better   Dr. Brand Males, M.D., [facility].C.P Pulmonary and Critical Care Medicine Staff Physician [facility] Pager: [phone], If no answer or between  15:00h - 7:00h: call [phone]  01/10/2015 9:05 AM

## 2015-01-11 NOTE — Progress Notes (Signed)
Name: Frances Gonzalez MRN: [id number] DOB: 11-13-1935    ADMISSION DATE:  01/06/2015  REFERRING MD :  Dr. Eulis Foster  CHIEF COMPLAINT:  SOB   BRIEF PATIENT DESCRIPTION: 80 y/o F with PMH of small cell Lung CA s/p chemo / XRT who presented 1/11 with a 2-3 wk hx of progressive SOB and profound hypoxemia.  PCCM consulted for evaluation.   SIGNIFICANT EVENTS  1/11  Admit with SOB, hypoxemia: CT Chest 1/11 >> neg for PE, diffuse increase in interstitial markings bilaterally 1/12  O2 needs remain @ 15L, desaturates into mid 80's withVM 1/13  O2 reduced to 45% / 10L: CXR 1/13 >> diffuse interstitial lung disease 1/14  O2 down to 6L, BP mildly softy.  Questioning when she might be able to go home.    SUBJECTIVE/OVERNIGHT/INTERVAL HX 01/11/2015 Pt improved. More productive cough. Less desats.   VITAL SIGNS: Temp:  [98.1 F (36.7 C)-98.5 F (36.9 C)] 98.3 F (36.8 C) (01/16 0548) Pulse Rate:  [108-123] 108 (01/16 0548) Resp:  [20] 20 (01/16 0548) BP: (102-116)/(58-73) 116/73 mmHg (01/16 0548) SpO2:  [82 %-93 %] 91 % (01/16 0548)  PHYSICAL EXAMINATION: General:  wdwn female in NAD Neuro:  AAOx4, speech clear, MAE HEENT:  Mm pink/moist, no jvd, ? Sclera icterus  Cardiovascular:  s1s2 mild tachy, no m/r/g Lungs:  resp's even/non-labored, lungs bilaterally with crackles lower posterior seem better since admit - mild Abdomen:  Round/soft, bsx4 active  Musculoskeletal:  No acute deformities  Skin:  Warm/dry, no edema.  LE symmetrical    PULMONARY No results for input(s): PHART, PCO2ART, PO2ART, HCO3, TCO2, O2SAT in the last 168 hours.  Invalid input(s): PCO2, PO2  CBC  Recent Labs Lab 01/08/15 0710 01/09/15 0400 01/10/15 0430  HGB 10.8* 11.2* 9.8*  HCT 31.2* 32.8* 28.7*  WBC 8.2 10.6* 4.8  PLT 179 185 163    COAGULATION  Recent Labs Lab 01/06/15 1647  INR 1.07    CARDIAC    Recent Labs Lab 01/06/15 1506 01/06/15 1647 01/06/15 2205 01/07/15 0435  TROPONINI  0.20* 0.20* 0.14* 0.10*   No results for input(s): PROBNP in the last 168 hours.   CHEMISTRY  Recent Labs Lab 01/06/15 1647 01/07/15 0435 01/08/15 0710 01/09/15 0400 01/10/15 0430  NA 125* 129* 132* 134* 134*  K 4.1 4.0 3.1* 3.1* 3.8  CL 92* 100 94* 94* 98  CO2 24 24 27 29 27   GLUCOSE 134* 170* 168* 112* 131*  BUN 22 19 29* 32* 24*  CREATININE 0.70 0.68 0.90 0.86 0.64  CALCIUM 8.3* 8.0* 8.3* 8.7 8.6  MG 1.4* 1.5 2.0  --   --   PHOS 4.2 3.3  --   --   --    Estimated Creatinine Clearance: 49.8 mL/min (by C-G formula based on Cr of 0.64).   LIVER  Recent Labs Lab 01/06/15 1647  AST 35  ALT 17  ALKPHOS 68  BILITOT 1.1  PROT 6.5  ALBUMIN 3.4*  INR 1.07     INFECTIOUS  Recent Labs Lab 01/06/15 1647 01/07/15 1120 01/08/15 0710 01/09/15 0400  LATICACIDVEN 1.4  --   --   --   PROCALCITON  --  <0.10 <0.10 <0.10     ENDOCRINE CBG (last 3)  No results for input(s): GLUCAP in the last 72 hours.       IMAGING x48h Dg Chest Port 1 View  01/10/2015   CLINICAL DATA:  Respiratory failure.  EXAM: PORTABLE CHEST - 1 VIEW  COMPARISON:  01/08/2015.  12/31/2013.  FINDINGS: Heart 4 catheter stable position. Stable cardiomegaly with normal pulmonary vascularity. Progressive bilateral interstitial prominence noted. This is consistent with active interstitial disease such as interstitial pneumonitis and/or edema. Underlying chronic interstitial disease is present. No pleural effusion or pneumothorax. No acute bony abnormality.  IMPRESSION: 1. Progressive bilateral pleural interstitial prominence consistent with active interstitial disease as is interstitial pneumonitis or interstitial pulmonary edema. Underlying chronic interstitial disease is present. 2. Stable cardiomegaly.  No pulmonary venous congestion. 3. Port-A-Cath in stable position.   Electronically Signed   By: Marcello Moores  Register   On: 01/10/2015 07:22        ASSESSMENT / LAN:  Acute Hypoxic Respiratory  Failure - DDX Pneumonitis s/p XRT, Improved 01/11/15  underlying undiagnosed hypoxia in setting of chronic lung disease.  PE, flu and cardiac source ruled out.  Small Cell Lung CA - s/p XRT COPD Former Tobacco Abuse - smoked ~ 50 years, 1ppd    Plan: O2 for pulse ox > 88% (if does not improve concern for lymphangitis carcinomatosis) Continue albuterol, symbicort Cont PPI to ensure to GERD contribution to sx, on at baseline Cont Pulmonary hygiene Cont Oxygen to support sats 88-95% (RN asked tto titrate on 01/10/2015)  Cont PO prednisone 40mg  per day since 01/09/15 x 2 weeks and the slow taper over nex 3-12 weeks for possible XRT pneumonitis Stopped  antibiptics 01/10/2015 - PCT negative  No lasix 01/11/15 PT eval for home needs   C VS  # - HTN Elevated Troponin - likely demand ischemia, mild elevation with peak of 0.20 Hx Small Pericardial Effusion   #At admit Trop leak - likely sterss NSTEMI - Grade I DD - noted on ECHO, otherwise essentially nml  Plan: Continue irbesartan - Lovenox for DVT proph    GOC:  DNR but full medical care. Palliation if worsens  DISOPO - cannot go home for several days till hypoxemia better   Asencion Noble MD  Beeper  [phone]  Cell  [phone]  If no response or cell goes to voicemail, call beeper [phone]   01/11/2015 8:18 AM

## 2015-01-12 LAB — CBC WITH DIFFERENTIAL/PLATELET
Basophils Absolute: 0 10*3/uL (ref 0.0–0.1)
Basophils Relative: 0 % (ref 0–1)
EOS PCT: 1 % (ref 0–5)
Eosinophils Absolute: 0.1 10*3/uL (ref 0.0–0.7)
HCT: 29.2 % — ABNORMAL LOW (ref 36.0–46.0)
HEMOGLOBIN: 9.8 g/dL — AB (ref 12.0–15.0)
LYMPHS PCT: 7 % — AB (ref 12–46)
Lymphs Abs: 0.5 10*3/uL — ABNORMAL LOW (ref 0.7–4.0)
MCH: 30.6 pg (ref 26.0–34.0)
MCHC: 33.6 g/dL (ref 30.0–36.0)
MCV: 91.3 fL (ref 78.0–100.0)
Monocytes Absolute: 0.2 10*3/uL (ref 0.1–1.0)
Monocytes Relative: 4 % (ref 3–12)
Neutro Abs: 5.8 10*3/uL (ref 1.7–7.7)
Neutrophils Relative %: 88 % — ABNORMAL HIGH (ref 43–77)
PLATELETS: 164 10*3/uL (ref 150–400)
RBC: 3.2 MIL/uL — ABNORMAL LOW (ref 3.87–5.11)
RDW: 15.5 % (ref 11.5–15.5)
WBC: 6.6 10*3/uL (ref 4.0–10.5)

## 2015-01-12 LAB — BASIC METABOLIC PANEL
ANION GAP: 10 (ref 5–15)
BUN: 18 mg/dL (ref 6–23)
CO2: 27 mmol/L (ref 19–32)
CREATININE: 0.61 mg/dL (ref 0.50–1.10)
Calcium: 8.5 mg/dL (ref 8.4–10.5)
Chloride: 97 mEq/L (ref 96–112)
GFR calc non Af Amer: 85 mL/min — ABNORMAL LOW (ref >90)
Glucose, Bld: 98 mg/dL (ref 70–99)
Potassium: 3.4 mmol/L — ABNORMAL LOW (ref 3.5–5.1)
Sodium: 134 mmol/L — ABNORMAL LOW (ref 135–145)

## 2015-01-12 MED ORDER — POTASSIUM CHLORIDE CRYS ER 20 MEQ PO TBCR
40.0000 meq | EXTENDED_RELEASE_TABLET | Freq: Once | ORAL | Status: AC
  Administered 2015-01-12: 40 meq via ORAL
  Filled 2015-01-12: qty 2

## 2015-01-12 NOTE — Care Management (Signed)
CARE MANAGEMENT NOTE 01/12/2015  Patient:  Frances Gonzalez, Frances Gonzalez   Account Number:  [id number]  Date Initiated:  01/08/2015  Documentation initiated by:  Dessa Phi  Subjective/Objective Assessment:   79 y/o f admitted w/hypoxemia.     Action/Plan:   From home.Has oncologist as pcp.Has pharmacy.   Anticipated DC Date:  01/13/2015   Anticipated DC Plan:  [place]  CM consult      Choice offered to / List presented to:  C-1 Patient   DME arranged  [facility]      DME agency  [place] arranged  HH-1 RN      [place].   Status of service:  Completed, signed off Medicare Important Message given?  YES (If response is "NO", the following Medicare IM given date fields will be blank) Date Medicare IM given:  01/09/2015 Medicare IM given by:  [facility] Date Additional Medicare IM given:   Additional Medicare IM given by:    Discharge Disposition:  [place]  Per UR Regulation:  Reviewed for med. necessity/level of care/duration of stay  If discussed at [place] of Stay Meetings, dates discussed:    Comments:  01/12/15 1145 - CM spoke with patient. She selected [facility] for [facility] RN and oxygen therapy. She will be staying at her sister's house when discharged until she can stay home alone. Provided her sister's address [address] [place], [address]. Kristen at [facility] notified of referral and anticipated d/c date of tomorrow. Cyril Mourning states she will inform Pura Spice of the new oxygen order. Venita Sheffield RN BSN CCM [phone]  01/10/15 Edwyna Shell RN BSN CM [phone] Provided patient with [facility] agency choice list and she stated that Dr. Chase Caller communicated to her that she would remain in the hospital for 1-3 more days sice her O2 drops with ambulation. The patient stated that she wants to wait until the time of dc before looking inot HH becasue she feels that she might not need it. Will  continue to follow for any home O2 and HH needs.  01/09/15 Edwyna Shell RN BSN CM [phone] Transferred from SDU. Patient stated that she lives at home, in a condo surrounded by 11 other units and receives a lot of support from her neighbors and is also supported by her sister. She does not know if she will go directly home upon dc or if she will stay with her sister for a short period of time prior to returning home. Patient is aware that she may need home O2 at time of dc, she does not have any home DME and does not feel that she needs any HH services but will defintiely be open to [facility] if MD feels appropriate. Will continue to follow for dc planning needs.  01/09/15 Dessa Phi RN BSN NCM [phone] Transferred from SDU.If home 02 needed can arrange.  01/08/15 Dessa Phi RN BSN NCM [phone] If home 02 needed can arrange if has qualifying 02 sats within 48hrs prior to d/c.

## 2015-01-12 NOTE — Progress Notes (Signed)
Name: Frances Gonzalez MRN: [id number] DOB: 06/13/1935    ADMISSION DATE:  01/06/2015  REFERRING MD :  Dr. Eulis Foster  CHIEF COMPLAINT:  SOB   BRIEF PATIENT DESCRIPTION: 80 y/o F with PMH of small cell Lung CA s/p chemo / XRT who presented 1/11 with a 2-3 wk hx of progressive SOB and profound hypoxemia.  PCCM consulted for evaluation.   SIGNIFICANT EVENTS  1/11  Admit with SOB, hypoxemia: CT Chest 1/11 >> neg for PE, diffuse increase in interstitial markings bilaterally 1/12  O2 needs remain @ 15L, desaturates into mid 80's withVM 1/13  O2 reduced to 45% / 10L: CXR 1/13 >> diffuse interstitial lung disease 1/14  O2 down to 6L, BP mildly softy.  Questioning when she might be able to go home.    SUBJECTIVE/OVERNIGHT/INTERVAL HX 01/12/2015 Pt improved. Less productive cough. Less desats. On 6L sats mid 90s   VITAL SIGNS: Temp:  [97.9 F (36.6 C)-98.8 F (37.1 C)] 98.8 F (37.1 C) (01/17 0623) Pulse Rate:  [118-122] 118 (01/17 0623) Resp:  [18-20] 18 (01/17 0623) BP: (120-135)/(65-87) 135/87 mmHg (01/17 0623) SpO2:  [90 %-93 %] 91 % (01/17 0623) Weight:  [62.6 kg (138 lb 0.1 oz)-62.732 kg (138 lb 4.8 oz)] 62.732 kg (138 lb 4.8 oz) (01/17 6301)  PHYSICAL EXAMINATION: General:  wdwn female in NAD Neuro:  AAOx4, speech clear, MAE HEENT:  Mm pink/moist, no jvd, ? Sclera icterus  Cardiovascular:  s1s2 mild tachy, no m/r/g Lungs:  resp's even/non-labored, improved BS Abdomen:  Round/soft, bsx4 active  Musculoskeletal:  No acute deformities  Skin:  Warm/dry, no edema.  LE symmetrical    PULMONARY No results for input(s): PHART, PCO2ART, PO2ART, HCO3, TCO2, O2SAT in the last 168 hours.  Invalid input(s): PCO2, PO2  CBC  Recent Labs Lab 01/09/15 0400 01/10/15 0430 01/12/15 0444  HGB 11.2* 9.8* 9.8*  HCT 32.8* 28.7* 29.2*  WBC 10.6* 4.8 6.6  PLT 185 163 164    COAGULATION  Recent Labs Lab 01/06/15 1647  INR 1.07    CARDIAC    Recent Labs Lab 01/06/15 1506  01/06/15 1647 01/06/15 2205 01/07/15 0435  TROPONINI 0.20* 0.20* 0.14* 0.10*   No results for input(s): PROBNP in the last 168 hours.   CHEMISTRY  Recent Labs Lab 01/06/15 1647 01/07/15 0435 01/08/15 0710 01/09/15 0400 01/10/15 0430 01/12/15 0444  NA 125* 129* 132* 134* 134* 134*  K 4.1 4.0 3.1* 3.1* 3.8 3.4*  CL 92* 100 94* 94* 98 97  CO2 24 24 27 29 27 27   GLUCOSE 134* 170* 168* 112* 131* 98  BUN 22 19 29* 32* 24* 18  CREATININE 0.70 0.68 0.90 0.86 0.64 0.61  CALCIUM 8.3* 8.0* 8.3* 8.7 8.6 8.5  MG 1.4* 1.5 2.0  --   --   --   PHOS 4.2 3.3  --   --   --   --    Estimated Creatinine Clearance: 50 mL/min (by C-G formula based on Cr of 0.61).   LIVER  Recent Labs Lab 01/06/15 1647  AST 35  ALT 17  ALKPHOS 68  BILITOT 1.1  PROT 6.5  ALBUMIN 3.4*  INR 1.07     INFECTIOUS  Recent Labs Lab 01/06/15 1647 01/07/15 1120 01/08/15 0710 01/09/15 0400  LATICACIDVEN 1.4  --   --   --   PROCALCITON  --  <0.10 <0.10 <0.10     ENDOCRINE CBG (last 3)  No results for input(s): GLUCAP in the last 72 hours.  IMAGING x48h No results found.   ASSESSMENT / LAN:  Acute Hypoxic Respiratory Failure - DDX Pneumonitis s/p XRT, Improved 01/12/15  underlying undiagnosed hypoxia in setting of chronic lung disease.  PE, flu and cardiac source ruled out.  Small Cell Lung CA - s/p XRT COPD Former Tobacco Abuse - smoked ~ 50 years, 1ppd    Plan: O2 for pulse ox > 88% (if does not improve concern for lymphangitis carcinomatosis) Continue albuterol, symbicort Cont PPI to ensure to GERD contribution to sx, on at baseline Cont Pulmonary hygiene Cont Oxygen to support sats 88-95% Pt will need HOME oxygen Cont PO prednisone 40mg  per day since 01/09/15 x 2 weeks and the slow taper over nex 3-12 weeks for  possible XRT pneumonitis Stopped  antibiptics 01/10/2015 - PCT negative  No lasix 01/12/15 PT eval for home needs Prob d/c home 1/18 with HH RN  C VS  # - HTN Elevated  Troponin - likely demand ischemia, mild elevation with peak of 0.20 Hx Small Pericardial Effusion   #At admit Trop leak - likely sterss NSTEMI - Grade I DD - noted on ECHO, otherwise essentially nml  Plan: Continue irbesartan - Lovenox for DVT proph    GOC:  DNR but full medical care. Palliation if worsens  DISOPO -d/c plan 1/18 with [place] O2   No hospice as of yet  Asencion Noble MD  Beeper  [phone]  Cell  [phone]  If no response or cell goes to voicemail, call beeper [phone]   01/12/2015 9:04 AM

## 2015-01-12 NOTE — Progress Notes (Signed)
SATURATION QUALIFICATIONS: (This note is used to comply with regulatory documentation for home oxygen)  Patient Saturations on Room Air at Rest = 85%  Patient Saturations on Room Air while Ambulating = 81%  Patient Saturations on 6 Liters of oxygen while Ambulating = 91%  Please briefly explain why patient needs home oxygen:

## 2015-01-13 MED ORDER — HEPARIN SOD (PORK) LOCK FLUSH 100 UNIT/ML IV SOLN
500.0000 [IU] | INTRAVENOUS | Status: AC | PRN
  Administered 2015-01-13: 500 [IU]

## 2015-01-13 MED ORDER — PREDNISONE 20 MG PO TABS: 40.0000 mg | ORAL_TABLET | Freq: Every day | ORAL | Status: AC

## 2015-01-13 NOTE — Discharge Summary (Signed)
Physician Discharge Summary  Patient ID: [name]: [id number] DOB/AGE: [birth year] 80 y.o.  Admit date: 01/06/2015 Discharge date: 01/13/2015    Discharge Diagnoses:  Acute Hypoxic Respiratory Failure Radiation Pneumonitis Small Cell Lung Cancer s/p XRT COPD  Former Tobacco Abuse HTN Mild Elevation of Troponin Small Pericardial Effusion                                                                       DISCHARGE PLAN BY DIAGNOSIS     Acute Hypoxic Respiratory Failure Radiation Pneumonitis Small Cell Lung Cancer s/p XRT COPD  Former Tobacco Abuse  Discharge Plan: Oxygen at 6L per [place] continuously  Continue albuterol, symbicort Continue PPI Prednisone $RemoveBefore'40mg'QoWHigefIOVHD$  per day initiated 01/09/15 x 2 weeks (1/28) and then slow taper over next 3-12 weeks.  Planned for decrease by 10 mg per week at discharge but may need to alter pending on patient response Home PT    HTN Mild Elevation of Troponin Small Pericardial Effusion   Discharge Plan: Continue Irbesartan                   DISCHARGE SUMMARY   Frances Gonzalez is a 80 y.o. y/o female , former smoker, with PMH of HTN, small cell lung cancer with large mediastinal mass, bilateral mediastinal LAN, bilateral pulmonary nodules, supraclavicular LAN and questionable malignant pericardial effusion which was diagnosed in April 2015 who presented to the [facility] emergency room on 1/11 with a 2 to three-week history of progressive shortness of breath (began mid December 2015). She was admitted 12/13/14 with similar symptoms and PE/DVT was ruled out. CTA of the chest at that time was negative for PE demonstrated scattered peripheral scarring reflective of chronic changes and emphysema. Focal scarring noted at the right lung apex, hazy masslike density in the subcarinal region which was decreased from previous preradiation studies. The patient was initially seen in urgent care and given 3 days of prednisone without relief of  shortness of breath in late December. She then called the answering service and additional time and was given a short course of oral antibiotics.   On day of admission her sister reported that the patient could not stand up and walk straight prompting EMS evaluation. On arrival to the ER her oxygen saturations were noted to be in the low 80s with improvement after application of 585% nonrebreather. Initial chest x-ray was negative for acute infiltrate/edema. Lab work notable for sodium 125, serum creatinine 0.86, troponin 0.12, hemoglobin 11.6, and platelets 166. EKG was negative for acute ST elevation. PCCM consulted for admission.   The patient was admitted to SDU for close observation with high O2 needs (15L).  CTA of the chest was repeated and negative for PE but notable for a diffuse increase in interstitial markings bilaterally.  She was treated with IV Levaquin, PPI, and prednisone.  SIADH was ruled out. Respiratory viral panel was negative.  2-D echo was assessed with an EF of 55-60% and normal biventricular function. BNP was mildly elevated at 322 on admission and she was treated with Lasix.  Despite diuresis her oxygen needs remained at 15 L with significant desaturations into the mid 80s with activity. Steroids were escalated with slow improvement in oxygen requirements.  Levaquin was quickly tapered and she did not have supporting evidence for acute infection. On 1/18 patient was medically cleared for discharge. Working diagnosis for bilateral diffuse airspace disease thought to be radiation pneumonitis. See above for discharge plans.               SIGNIFICANT EVENTS  1/11 Admit with SOB, hypoxemia: CT Chest neg for PE, diffuse increase in interstitial markings bilaterally 1/12 O2 needs remain @ 15L, desaturates into mid 80's with VM  1/13 O2 reduced to 45% / 10L: CXR 1/13 >> diffuse interstitial lung disease 1/14 O2 down to 6L, BP mildly softy. Questioning when she might be able  to go home.    SIGNIFICANT DIAGNOSTIC STUDIES 1/11 CT Chest >> neg for PE, diffuse increase in interstitial markings bilaterally 1/12  ECHO >> EF 55-60%, normal biventricular function  MICRO DATA  1/11  RVP >> neg   ANTIBIOTICS Levaquin 1/11 >> 1/14     Discharge Exam: General: wdwn adult female in NAD Neuro: AAOx4, speech clear, MAE CV: s1s2 tachy, regular  PULM: respirations even/non-labored GI: tolerating PO's Extremities: warm/dry, no edema   Filed Vitals:   01/12/15 2104 01/13/15 0645 01/13/15 1020 01/13/15 1025  BP: 125/67 146/79    Pulse: 119 105 137   Temp: 97.5 F (36.4 C) 99.1 F (37.3 C)    TempSrc: Oral Oral    Resp: 24 20    Height:      Weight:  137 lb 4.8 oz (62.279 kg)    SpO2: 93% 91% 77% 90%     Discharge Labs  BMET  Recent Labs Lab 01/06/15 1647 01/07/15 0435 01/08/15 0710 01/09/15 0400 01/10/15 0430 01/12/15 0444  NA 125* 129* 132* 134* 134* 134*  K 4.1 4.0 3.1* 3.1* 3.8 3.4*  CL 92* 100 94* 94* 98 97  CO2 $Re'24 24 27 29 27 27  'Mwe$ GLUCOSE 134* 170* 168* 112* 131* 98  BUN 22 19 29* 32* 24* 18  CREATININE 0.70 0.68 0.90 0.86 0.64 0.61  CALCIUM 8.3* 8.0* 8.3* 8.7 8.6 8.5  MG 1.4* 1.5 2.0  --   --   --   PHOS 4.2 3.3  --   --   --   --     CBC  Recent Labs Lab 01/09/15 0400 01/10/15 0430 01/12/15 0444  HGB 11.2* 9.8* 9.8*  HCT 32.8* 28.7* 29.2*  WBC 10.6* 4.8 6.6  PLT 185 163 164    Anti-Coagulation  Recent Labs Lab 01/06/15 1647  INR 1.07    Discharge Instructions    Call MD for:  difficulty breathing, headache or visual disturbances    Complete by:  As directed      Call MD for:  extreme fatigue    Complete by:  As directed      Call MD for:  hives    Complete by:  As directed      Call MD for:  persistant dizziness or light-headedness    Complete by:  As directed      Call MD for:  persistant nausea and vomiting    Complete by:  As directed      Call MD for:  severe uncontrolled pain    Complete by:  As  directed      Call MD for:  temperature >100.4    Complete by:  As directed      Diet general    Complete by:  As directed      Discharge instructions  Complete by:  As directed   1.  Review your medications carefully.  2.  Wear oxygen at 6L per [place] continuously 3.  Perform activities with rest breaks  4.  No smoking near your oxygen tanks  5.  Stay on prednisone 40 mg daily until 1/28 and then follow taper as prescribed 6.  Keep follow up appointments as scheduled.     Increase activity slowly    Complete by:  As directed                 Follow-up Information    Follow up with [facility].   Why:  Home Health Nursing    Contact information:   [address] [place] [address] [phone]       Follow up with TODD,JEFFREY Zenia Resides, MD.   Specialty:  Family Medicine   Why:  As needed   Contact information:   [place] [address] [phone]       Follow up with PARRETT,TAMMY, NP On 01/17/2015.   Specialty:  Nurse Practitioner   Why:  Appt at 4 PM   Contact information:   [place]. [place] [address] [phone]          Medication List    TAKE these medications        albuterol 108 (90 BASE) MCG/ACT inhaler  Commonly known as:  PROVENTIL HFA;VENTOLIN HFA  Inhale 2 puffs into the lungs every 6 (six) hours as needed for wheezing or shortness of breath.     budesonide-formoterol 160-4.5 MCG/ACT inhaler  Commonly known as:  SYMBICORT  Take 2 puffs first thing in am and then another 2 puffs about 12 hours later.     irbesartan 150 MG tablet  Commonly known as:  AVAPRO  Take 75 mg by mouth daily. Half tablet daily     multivitamin tablet  Take 1 tablet by mouth daily.     pantoprazole 40 MG tablet  Commonly known as:  PROTONIX  Take 40 mg by mouth daily.     predniSONE 20 MG tablet  Commonly known as:  DELTASONE  Take 2 tablets (40 mg total) by mouth daily with breakfast. Take 40 mg  daily until 1/28, then reduce to 30 mg daily for one week, then 20 mg daily for one week, then 10 mg daily for one week     simvastatin 20 MG tablet  Commonly known as:  ZOCOR  TAKE 1/2 TABLET BY MOUTH EVERY NIGHT AT BEDTIME          Disposition:  Home with sister.  Home health PT & O2 arranged at discharge.  No other HH needs identified.   Discharged Condition: Frances Gonzalez has met maximum benefit of inpatient care and is medically stable and cleared for discharge.  Patient is pending follow up as above.      Time spent on disposition:  Greater than 35 minutes.   Signed: Noe Gens, NP-C [facility] Pgr: [phone] Office: [phone]    Attending:  I have seen and examined the patient with nurse practitioner/resident and agree with the note above.   Roselie Awkward, MD [place] [facility] Pager: [phone] Cell: [phone] If no response, call [phone]

## 2015-01-13 NOTE — Progress Notes (Signed)
[facility] PCCM  S: Feels well, wants to go home O:  Filed Vitals:   01/12/15 2104 01/13/15 0645 01/13/15 1020 01/13/15 1025  BP: 125/67 146/79    Pulse: 119 105 137   Temp: 97.5 F (36.4 C) 99.1 F (37.3 C)    TempSrc: Oral Oral    Resp: 24 20    Height:      Weight:  62.279 kg (137 lb 4.8 oz)    SpO2: 93% 91% 77% 90%   Gen: well appearing HEENT: NCAT, EOMi PULM: Crackles in bases CV: RRR no mgr AB: BS+ soft, nontender Ext: warm, no edema  Images reviewed  I agree with the plan by Dr. Purnell Shoemaker and Dr. Joya Gaskins looks like radiation fibrosis, hopefully no lymphangitic spread of malignancy, plan prednisone taper as outlined in 1/17 note.  F/U with Tammy Parrett D/C summary to follow today  Roselie Awkward, MD [place] [facility] Pager: [phone] Cell: [phone] If no response, call [phone]

## 2015-01-13 NOTE — Telephone Encounter (Signed)
Ok to close note as now inpt

## 2015-01-13 NOTE — Discharge Instructions (Signed)
1.  Review your medications carefully.  2.  Wear oxygen at 6L per [place] continuously 3.  Perform activities with rest breaks  4.  No smoking near your oxygen tanks  5.  Stay on prednisone 40 mg daily until 1/28 and then follow taper as prescribed 6.  Keep follow up appointments as scheduled.

## 2015-01-13 NOTE — Progress Notes (Signed)
Physical Therapy Treatment Patient Details Name: Frances Gonzalez MRN: [id number] DOB: 12-31-1934 Today's Date: 01/13/2015    History of Present Illness 80 year old female with PMH of small cell Lung CA s/p chemo/XRT admitted 1/11 with a 2-3 wk hx of progressive SOB and profound hypoxemia.    PT Comments    *Pt tolerated increased distance of 60' with walking. She was on continuous O2 at 6L with sats initially in the 90's, however at 5' she was talking while walking and suddenly dropped to 77% and HR increased to 137. After 5 min rest SaO2 up to 90 on 6L O2. Pt demonstrated good pursed lip breathing technique while walking. **  Follow Up Recommendations  Home health PT (O2)     Equipment Recommendations  Rolling walker with 5" wheels    Recommendations for Other Services       Precautions / Restrictions Precautions Precaution Comments: monitor O2, HR Restrictions Weight Bearing Restrictions: No    Mobility  Bed Mobility Overal bed mobility: Modified Independent Bed Mobility: Supine to Sit     Supine to sit: HOB elevated;Modified independent (Device/Increase time)     General bed mobility comments: used rail  Transfers Overall transfer level: Needs assistance Equipment used: None Transfers: Sit to/from Stand Sit to Stand: Supervision            Ambulation/Gait   Ambulation Distance (Feet): 60 Feet Assistive device: None Gait Pattern/deviations: Step-through pattern;Decreased stride length Gait velocity: decr Gait velocity interpretation: Below normal speed for age/gender General Gait Details: slow pace, held onto hand rail occasionally,  SaO2 in 90's until pt reached 60', then dropped to 77% on 6L O2, HR 137, 3/4 dyspnea; after 5 min SaO2 up to 90% on 6L   Stairs            Wheelchair Mobility    Modified Rankin (Stroke Patients Only)       Balance                                    Cognition Arousal/Alertness:  Awake/alert Behavior During Therapy: WFL for tasks assessed/performed Overall Cognitive Status: Within Functional Limits for tasks assessed                      Exercises      General Comments        Pertinent Vitals/Pain Pain Assessment: No/denies pain    Home Living                      Prior Function            PT Goals (current goals can now be found in the care plan section) Acute Rehab PT Goals PT Goal Formulation: With patient Time For Goal Achievement: 01/16/15 Potential to Achieve Goals: Good Progress towards PT goals: Progressing toward goals    Frequency  Min 3X/week    PT Plan Current plan remains appropriate    Co-evaluation             End of Session Equipment Utilized During Treatment: Oxygen Activity Tolerance: Other (comment) (limited by DOE) Patient left: with call bell/phone within reach;in bed;with family/visitor present     Time: 7893-8101 PT Time Calculation (min) (ACUTE ONLY): 20 min  Charges:  $Gait Training: 8-22 mins  G Codes:      Philomena Doheny 01/13/2015, 10:25 AM [phone]

## 2015-01-13 NOTE — Progress Notes (Signed)
Discharge instructions and medications reviewed with patient. Patient verbalizes understanding and has no questions at this time. Home O2 delivered to patient room. Patient confirms she has all personal belongings in her possession. Patient discharged home with sister.

## 2015-01-13 NOTE — Progress Notes (Addendum)
CARE MANAGEMENT NOTE 01/13/2015  Patient:  Frances Gonzalez, Frances Gonzalez   Account Number:  [id number]  Date Initiated:  01/08/2015  Documentation initiated by:  Dessa Phi  Subjective/Objective Assessment:   80 y/o f admitted w/hypoxemia.     Action/Plan:   From home.Has oncologist as pcp.Has pharmacy.   Anticipated DC Date:  01/13/2015   Anticipated DC Plan:  [place]  CM consult      Choice offered to / List presented to:  C-1 Patient   DME arranged  [facility]      DME agency  [place] arranged  HH-1 RN  [place].   Status of service:  Completed, signed off Medicare Important Message given?  YES (If response is "NO", the following Medicare IM given date fields will be blank) Date Medicare IM given:  01/09/2015 Medicare IM given by:  Dessa Phi Date Additional Medicare IM given:  01/13/2015 Additional Medicare IM given by:  Edwyna Shell  Discharge Disposition:  [place]  Per UR Regulation:  Reviewed for med. necessity/level of care/duration of stay  If discussed at [place] of Stay Meetings, dates discussed:    Comments:  01/13/15 Edwyna Shell RN BSN [place] [address] [place] ordered [facility] services with patient and sister Baker Janus. Also discussed home O2 and explained that the paortable tank will be delivered to the patient room and home set up will be delivered to the house. Patient stated she will be going to stay with her sister, Baker Janus, initially at [address] [place] [address]. Please contact patient on cell phone or sister's home phone [phone] for [facility] visits or DME delivery. Communicated orders to [place], [facility] DME, and [place], [place] rep, for referral. [facility] WILL DELIVER HOME O2 (TODAY) PRIOR TO DC, PATIENT AND SISTER GAIL AWARE. PLEASE MAKE SURE HOME O2 IN PLACE AND PORTABLE TANKS HAVE BEEN  DELIVERED IN ROOM PRIOR TO DC.  01/12/15 1145 - CM spoke with patient. She selected [facility] for [facility] RN and oxygen therapy. She will be staying at her sister's house when discharged until she can stay home alone. Provided her sister's address [address], [place], [address]. Kristen at [facility] notified of referral and anticipated d/c date of tomorrow. Cyril Mourning states she will inform Pura Spice of the new oxygen order. Venita Sheffield RN BSN CCM [phone]  01/10/15 Edwyna Shell RN BSN CM [phone] Provided patient with [facility] agency choice list and she stated that Dr. Chase Caller communicated to her that she would remain in the hospital for 1-3 more days sice her O2 drops with ambulation. The patient stated that she wants to wait until the time of dc before looking inot HH becasue she feels that she might not need it. Will continue to follow for any home O2 and HH needs.  01/09/15 Edwyna Shell RN BSN CM [phone] Transferred from SDU. Patient stated that she lives at home, in a condo surrounded by 11 other units and receives a lot of support from her neighbors and is also supported by her sister. She does not know if she will go directly home upon dc or if she will stay with her sister for a short period of time prior to returning home. Patient is aware that she may need home O2 at time of dc, she  does not have any home DME and does not feel that she needs any HH services but will defintiely be open to [facility] if MD feels appropriate. Will continue to follow for dc planning needs.  01/09/15 Dessa Phi RN BSN NCM [phone] Transferred from SDU.If home 02 needed can arrange.  01/08/15 Dessa Phi RN BSN NCM [phone] If home 02 needed can arrange if has qualifying 02 sats within 48hrs prior to d/c.

## 2015-01-14 ENCOUNTER — Telehealth: Payer: Self-pay | Admitting: Pulmonary Disease

## 2015-01-14 ENCOUNTER — Telehealth: Payer: Self-pay | Admitting: Internal Medicine

## 2015-01-14 NOTE — Telephone Encounter (Signed)
OK per MW for pt to switch physicians. Called and spoke to pt to inform her. Pt aware to keep HFU with TP. Nothing further needed.

## 2015-01-14 NOTE — Telephone Encounter (Signed)
Daneil Dan  Patient was seen by me  In hopsital last wee but 01/13/15 she told NP that she wants to see me at fu. Please ensure DR Melvyn Novas ok with it and give her fu to see TP/me  Thanks  Dr. Brand Males, M.D., [facility].C.P Pulmonary and Critical Care Medicine Staff Physician [facility] Pager: [phone], If no answer or between  15:00h - 7:00h: call [phone]  01/14/2015 1:10 AM   .

## 2015-01-14 NOTE — Telephone Encounter (Signed)
Pt was discharged from hospital 1/18 with recent dx of lung cancer and new diagnosis of radiation pneumonitis with severe hypoxemia. I was called by RN from [facility] who was performing initial evaluation and reports oxygen saturations in the low 80s on 6 lpm by . She reports that the pt is mildly dyspneic at rest and moderately to severely dyspneic with exertion. The patient does not report significant worsening since discharge and has no other new symptoms. The pt stated that she wishes to avoid returning to the hospital tonight for further evaluation  IMPRESSION: Recent diagnosis of small cell Ca of lung Recent dx of radiation pneumonitis Acute on chronic hypoxic respiratory failure Pt desire to avoid evaluation in ED on the evening of this encounter  PLAN: Continue discharge plan as outlined previously Will notify Dr Chase Caller and [facility] pulmonary office to have her seen on the day following this encounter  Merton Border, MD ; [facility] service Mobile [phone].  After 5:30 PM or weekends, call [phone]

## 2015-01-14 NOTE — Telephone Encounter (Signed)
I was contacted again by the [facility] RN who informed me that there had been a problem with the oxygen concentrator and the patient was not receiving the prescribed oxygen flow of 6 lpm. They have fixed the problem and the O2 sats are in the low 90s. There is no need for an appointment tomorrow and the pt will keep her previously arranged appointment scheduled for 1/22  Merton Border, MD ; [facility] service Mobile [phone].  After 5:30 PM or weekends, call [phone]

## 2015-01-14 NOTE — Telephone Encounter (Signed)
Called and spoke to pt. Pt is requesting to change providers from MW to MR.   Dr. Melvyn Novas please advise.

## 2015-01-16 ENCOUNTER — Telehealth: Payer: Self-pay | Admitting: Adult Health

## 2015-01-16 NOTE — Telephone Encounter (Signed)
Called spoke with pt. She reports she was told to alert [employer] if she has power outages. I advised her she will need to get a medical alert form from [employer] for [place] to fill out. She needed nothing further

## 2015-01-17 ENCOUNTER — Inpatient Hospital Stay: Payer: Medicare Other | Admitting: Adult Health

## 2015-01-20 ENCOUNTER — Encounter: Payer: Self-pay | Admitting: Adult Health

## 2015-01-20 ENCOUNTER — Ambulatory Visit (INDEPENDENT_AMBULATORY_CARE_PROVIDER_SITE_OTHER): Payer: Medicare Other | Admitting: Adult Health

## 2015-01-20 VITALS — BP 126/86 | HR 110 | Temp 97.5°F | Ht 61.0 in | Wt 136.6 lb

## 2015-01-20 DIAGNOSIS — J9601 Acute respiratory failure with hypoxia: Secondary | ICD-10-CM

## 2015-01-20 DIAGNOSIS — J441 Chronic obstructive pulmonary disease with (acute) exacerbation: Secondary | ICD-10-CM

## 2015-01-20 DIAGNOSIS — C342 Malignant neoplasm of middle lobe, bronchus or lung: Secondary | ICD-10-CM

## 2015-01-20 DIAGNOSIS — Z23 Encounter for immunization: Secondary | ICD-10-CM

## 2015-01-20 DIAGNOSIS — J700 Acute pulmonary manifestations due to radiation: Secondary | ICD-10-CM

## 2015-01-20 MED ORDER — ALBUTEROL SULFATE HFA 108 (90 BASE) MCG/ACT IN AERS: 2.0000 | INHALATION_SPRAY | Freq: Four times a day (QID) | RESPIRATORY_TRACT | Status: AC | PRN

## 2015-01-20 MED ORDER — BUDESONIDE-FORMOTEROL FUMARATE 160-4.5 MCG/ACT IN AERO: INHALATION_SPRAY | RESPIRATORY_TRACT | Status: AC

## 2015-01-20 NOTE — Patient Instructions (Signed)
Taper Prednisone as directed  Hold at 10mg  daily .  Delsym 2 tsp Twice daily  As needed  Cough  Mucinex Twice daily  As needed  Congestion.  Follow up Dr. Chase Caller in 2-3 weeks with chest xray  Please contact office for sooner follow up if symptoms do not improve or worsen or seek emergency care

## 2015-01-20 NOTE — Assessment & Plan Note (Signed)
Improving with decreasing O2 demands   Plan  Taper Prednisone as directed  Hold at 10mg  daily .  Delsym 2 tsp Twice daily  As needed  Cough  Mucinex Twice daily  As needed  Congestion.  Follow up Dr. Chase Caller in 2-3 weeks with chest xray  Please contact office for sooner follow up if symptoms do not improve or worsen or seek emergency care

## 2015-01-20 NOTE — Assessment & Plan Note (Signed)
Suspected flare -appears to be responding to steroids with less O2 demands  Will repeat cxr on return  Taper steroids slowly and hold at 10mg   Until return   Plan  Taper Prednisone as directed  Hold at 10mg  daily .  Delsym 2 tsp Twice daily  As needed  Cough  Mucinex Twice daily  As needed  Congestion.  Follow up Dr. Chase Caller in 2-3 weeks with chest xray  Please contact office for sooner follow up if symptoms do not improve or worsen or seek emergency care

## 2015-01-20 NOTE — Assessment & Plan Note (Signed)
Cont to follow with oncology

## 2015-01-20 NOTE — Progress Notes (Signed)
Subjective:    Patient ID: Frances Gonzalez, female    DOB: May 13, 1935  MRN: [id number]   Brief patient profile:  60 yowf quit smoking 2005 s/p RULobectomy by Arlyce Dice with no breathing problem at that point referred 01/10/2014 by Dr Sherren Mocha for eval of sob    History of Present Illness  01/10/2014 1st [facility] office visit/ Wert cc acute onset sob/sense of chest chestion in mid Dec 2014 assoc with runny nose and initially feverish rx and no better since then  on qvar bid and zyrtec.  No more fever. Never purulent secretions but almost constant daytime sensation of throat and chest "congestion" x 10 years (ever since post op)  Nose dripping p heat comes on in am x years, doe when walks and talks at same time or "gets in a real hurry". rec Stop losartan and qvar  Start avapro 150 one half daily  Continue prilosec but take it  Take 30-60 min before first meal of the day GERD diet    04/05/2014 f/u ov/Wert re:  Doe if walking more than regular pace or carrying more than 10 lb asso with freq throat clearing and breathing worse with bending over despite rx with prilosec 20 mg ac daily  Chief Complaint  Patient presents with  . Follow-up    PFT done 04/03/14. She reports her breathing is unchanged since last cisit. No new co's today.   no worse off all inhalers but no betteron  either, still almost constant daytime urge to clear her throat but no excess mucus, day >> night,  Worse with voice use rec Take delsym two tsp every 12 hours  to suppress the urge to cough. Swallowing water or using ice chips/non mint and menthol containing hard candies (such as lifesavers or sugarless jolly ranchers) are also effective.  You should rest your voice and avoid activities that you know make you cough. Pantoprazole (protonix) 40 mg Take 30-60 min before first meal of the day Please remember to go to the lab department downstairs for your tests - we will call you with the results when they are  available. Late add: needs CTa chest and sinus CT  to complete w/u for unexplained doe     Oct 19 2014  finished RT R upper chest and last chemo Aug 2015    01/01/2015 1st [facility] office visit/ Wert   Chief Complaint  Patient presents with  . Follow-up    f/u sob; Prednisone is not helping this time, she has 3 tablets left of Prednisone. Pt feels fine other than shortness of Breath.    still on ppi qd ac and no change p saba, no assoc cough Indolent onset minimal progression x 6 weeks of  Doe x fast pace or inclines or carrying objects  Albuterol does help for a few hours  >>No changes   01/20/2015 [facility] follow up : Small cell lung cance s/p chemo/XRT  Patient presents for a one-week post hospital follow-up. She was admitted January 11 to January 18 for acute hypoxic respiratory failure, and probable radiation pneumonitis. CT chest was negative for pulmonary embolism but showed diffuse increased interstitial markings bilaterally. 2-D echo showed EF of 55-60%. BNP was mildly elevated at 322. She was treated with IV antibiotics, steroids, diuresis, and nebulized bronchodilators. She was discharged on prednisone 40 mg with a very slow taper over the next 6 weeks. On admission. Patient did require 15 L to maintain O2 saturations At discharge was decreased  to 6 L of oxygen. Since discharge, she is feeling slightly better with slightly less dyspnea. Is able to maintain oxygen saturations on 6 L at home. She gets very winded easily with any type of walking. She denies any chest pain, orthopnea, PND, increased leg swelling or hemoptysis. She is on 40 mg of prednisone currently and will decrease by 10 mg weekly after January 28.   Current Medications, Allergies, Complete Past Medical History, Past Surgical History, Family History, and Social History were reviewed in [employer] record.  ROS  The following are not active complaints unless bolded sore  throat, dysphagia, dental problems, itching, sneezing,  nasal congestion or excess/ purulent secretions, ear ache,   fever, chills, sweats, unintended wt loss, pleuritic or exertional cp, hemoptysis,  orthopnea pnd or leg swelling, presyncope, palpitations, heartburn, abdominal pain, anorexia, nausea, vomiting, diarrhea  or change in bowel or urinary habits, change in stools or urine, dysuria,hematuria,  rash, arthralgias, visual complaints, headache, numbness weakness or ataxia or problems with walking or coordination,  change in mood/affect or memory.         Objective:   Physical Exam   amb wf chronically ill appearing   HEENT: nl dentition, turbinates, and orophanx. Nl external ear canals without cough reflex   NECK :  without JVD/Nodes/TM/ nl carotid upstrokes bilaterally   LUNGS: no acc muscle use, bibasilar crackles    CV:  RRR  no s3 or murmur or increase in P2, tr edema   ABD:  soft and nontender with nl excursion in the supine position. No bruits or organomegaly, bowel sounds nl  MS:  warm without deformities, calf tenderness, cyanosis or clubbing           CT chest 01/06/15  No pulmonary emboli. 2. Diffuse increased accentuation of the interstitial markings in both lungs. This could represent slight pulmonary edema or nonspecific pneumonitis.

## 2015-01-20 NOTE — Addendum Note (Signed)
Addended by: Parke Poisson E on: 01/20/2015 05:24 PM   Modules accepted: Orders

## 2015-01-20 NOTE — Assessment & Plan Note (Signed)
Resolving flare   Plan  Taper Prednisone as directed  Hold at 10mg  daily .  Delsym 2 tsp Twice daily  As needed  Cough  Mucinex Twice daily  As needed  Congestion.  Follow up Dr. Chase Caller in 2-3 weeks with chest xray  Please contact office for sooner follow up if symptoms do not improve or worsen or seek emergency care

## 2015-01-23 ENCOUNTER — Telehealth: Payer: Self-pay | Admitting: Internal Medicine

## 2015-01-23 NOTE — Telephone Encounter (Signed)
MW pt. Called and LMTCB x1

## 2015-01-26 ENCOUNTER — Other Ambulatory Visit: Payer: Self-pay | Admitting: Internal Medicine

## 2015-01-27 NOTE — Telephone Encounter (Signed)
She has switched to [facility] and he should handle this - if not available this week then bring her in to see Tammy NP

## 2015-01-27 NOTE — Telephone Encounter (Signed)
Spoke with Vida Roller, states pt's heart rate at rest is 120, sob, prod cough with clear mucus.  Taking Mucinex bid.  BP has been normal.  Anxiety gets worse as her sob worsens.  X1 week.  Is requesting recs.    Pt is currently staying with her sister, can be reached at [phone]   MW please advise.  Thank you.

## 2015-01-27 NOTE — Telephone Encounter (Signed)
Is hypoxemia worse? What are her sats running and on what level of o2?  Is cough worse than usual?  Is sputum worse than usual?  What is current dose of steroid?

## 2015-01-27 NOTE — Telephone Encounter (Signed)
Spoke with pt's sister. Advised her pt will need to come in this week. She has been scheduled to see TP on 01/29/15 at 9am. Would like to know what to do in the meantime.

## 2015-01-27 NOTE — Telephone Encounter (Signed)
Can they bring her in today to see me? . Will do EKG. If not possible for them: let them know Otherwise HR 120 is not that alarming but clinically if she is doing worse then should just go to ER  Long term I am just concerned she is not gettting better; need to have some advance planning conversatin when seen by me 01/27/2015 or TP 01/29/15  Let me know  Dr. Brand Males, M.D., [facility].C.P Pulmonary and Critical Care Medicine Staff Physician [place] Pager: [phone], If no answer or between  15:00h - 7:00h: call [phone]  01/27/2015 2:28 PM

## 2015-01-27 NOTE — Telephone Encounter (Signed)
Form completed and faxed on 01/24/15. Nothing further needed.

## 2015-01-27 NOTE — Telephone Encounter (Signed)
Spoke with pt's sister again. She is currently on 6L/min. While resting she is at 94%, with exertion she is 80%. Cough is under control at this time and she is not producing that much mucus. Prednisone is 30mg  at this time.  Please advise.

## 2015-01-27 NOTE — Telephone Encounter (Signed)
Frances Gonzalez did you receive this? thanks

## 2015-01-27 NOTE — Telephone Encounter (Signed)
Spoke with pt. States that they can not make appointment today. Has pending appointment with TP on 01/29/15 at [place] pt that if she gets worse she needs to go to the ER. She agreed and verbalized understanding.

## 2015-01-28 ENCOUNTER — Telehealth: Payer: Self-pay | Admitting: Internal Medicine

## 2015-01-28 NOTE — Telephone Encounter (Signed)
Spoke with pt's sister Baker Janus. Advised them that if pt's breathing is worse since yesterday's phone calls she will need to go to the ER. She stated that it isn't, but when they come in tomorrow they want to discuss changing her inhaler to nebulizer medications. Confirmed appointment date and time with Gail (01/29/15 at 9am). Nothing further was needed.

## 2015-01-29 ENCOUNTER — Ambulatory Visit: Payer: Medicare Other | Admitting: Internal Medicine

## 2015-01-29 ENCOUNTER — Ambulatory Visit (INDEPENDENT_AMBULATORY_CARE_PROVIDER_SITE_OTHER): Payer: Medicare Other | Admitting: Adult Health

## 2015-01-29 ENCOUNTER — Encounter: Payer: Self-pay | Admitting: Adult Health

## 2015-01-29 ENCOUNTER — Telehealth: Payer: Self-pay | Admitting: *Deleted

## 2015-01-29 VITALS — BP 124/78 | HR 117 | Temp 98.0°F | Ht 61.5 in | Wt 135.4 lb

## 2015-01-29 DIAGNOSIS — J700 Acute pulmonary manifestations due to radiation: Secondary | ICD-10-CM

## 2015-01-29 DIAGNOSIS — C342 Malignant neoplasm of middle lobe, bronchus or lung: Secondary | ICD-10-CM

## 2015-01-29 DIAGNOSIS — J9601 Acute respiratory failure with hypoxia: Secondary | ICD-10-CM

## 2015-01-29 MED ORDER — METHYLPREDNISOLONE ACETATE 80 MG/ML IJ SUSP
120.0000 mg | Freq: Once | INTRAMUSCULAR | Status: AC
  Administered 2015-01-29: 120 mg via INTRAMUSCULAR

## 2015-01-29 MED ORDER — PREDNISONE 20 MG PO TABS: ORAL_TABLET | ORAL | Status: AC

## 2015-01-29 NOTE — Assessment & Plan Note (Signed)
Adjust O2 to keep sats >90%.  Adjust steroids to see if helps with radiation pnuemonitis effects  Plan  Increase Prednisone 60mg  daily for 1 week then 50mg  daily for 1 week then 40mg  daily -hold at this dose.  Adjust Oxygen to keep sats >90%.  Follow up Dr. Chase Caller in 4 weeks and As needed   Please contact office for sooner follow up if symptoms do not improve or worsen or seek emergency care

## 2015-01-29 NOTE — Addendum Note (Signed)
Addended by: Parke Poisson E on: 01/29/2015 01:15 PM   Modules accepted: Orders

## 2015-01-29 NOTE — Progress Notes (Signed)
Subjective:    Patient ID: Frances Gonzalez, female    DOB: [birth year]  MRN: [id number]   Brief patient profile:  20 yowf quit smoking 2005 s/p RULobectomy by Arlyce Dice with no breathing problem at that point referred 01/10/2014 by Dr Sherren Mocha for eval of sob    History of Present Illness  01/10/2014 1st [facility] office visit/ Wert cc acute onset sob/sense of chest chestion in mid Dec 2014 assoc with runny nose and initially feverish rx and no better since then  on qvar bid and zyrtec.  No more fever. Never purulent secretions but almost constant daytime sensation of throat and chest "congestion" x 10 years (ever since post op)  Nose dripping p heat comes on in am x years, doe when walks and talks at same time or "gets in a real hurry". rec Stop losartan and qvar  Start avapro 150 one half daily  Continue prilosec but take it  Take 30-60 min before first meal of the day GERD diet    04/05/2014 f/u ov/Wert re:  Doe if walking more than regular pace or carrying more than 10 lb asso with freq throat clearing and breathing worse with bending over despite rx with prilosec 20 mg ac daily  Chief Complaint  Patient presents with  . Follow-up    PFT done 04/03/14. She reports her breathing is unchanged since last cisit. No new co's today.   no worse off all inhalers but no betteron  either, still almost constant daytime urge to clear her throat but no excess mucus, day >> night,  Worse with voice use rec Take delsym two tsp every 12 hours  to suppress the urge to cough. Swallowing water or using ice chips/non mint and menthol containing hard candies (such as lifesavers or sugarless jolly ranchers) are also effective.  You should rest your voice and avoid activities that you know make you cough. Pantoprazole (protonix) 40 mg Take 30-60 min before first meal of the day Please remember to go to the lab department downstairs for your tests - we will call you with the results when they are  available. Late add: needs CTa chest and sinus CT  to complete w/u for unexplained doe     Oct 19 2014  finished RT R upper chest and last chemo Aug 2015    01/01/2015 1st [facility] office visit/ Wert   Chief Complaint  Patient presents with  . Follow-up    f/u sob; Prednisone is not helping this time, she has 3 tablets left of Prednisone. Pt feels fine other than shortness of Breath.    still on ppi qd ac and no change p saba, no assoc cough Indolent onset minimal progression x 6 weeks of  Doe x fast pace or inclines or carrying objects  Albuterol does help for a few hours  >>No changes   01/20/15 [facility] follow up : Small cell lung cance s/p chemo/XRT  Patient presents for a one-week post hospital follow-up. She was admitted January 11 to January 18 for acute hypoxic respiratory failure, and probable radiation pneumonitis. CT chest was negative for pulmonary embolism but showed diffuse increased interstitial markings bilaterally. 2-D echo showed EF of 55-60%. BNP was mildly elevated at 322. She was treated with IV antibiotics, steroids, diuresis, and nebulized bronchodilators. She was discharged on prednisone 40 mg with a very slow taper over the next 6 weeks. On admission. Patient did require 15 L to maintain O2 saturations At discharge was decreased  to 6 L of oxygen. Since discharge, she is feeling slightly better with slightly less dyspnea. Is able to maintain oxygen saturations on 6 L at home. She gets very winded easily with any type of walking. She denies any chest pain, orthopnea, PND, increased leg swelling or hemoptysis. She is on 40 mg of prednisone currently and will decrease by 10 mg weekly after January 28.  01/29/2015 Acute OV: Metastatic Small cell lung cance s/p chemo/XRT Returns for persistent  increased SOB especially w/ exertion, some increased cough with mostly clear mucus x4days.  Seen last ov for post hospital follow up -acute hypoxic RF w/ probable  radiation pneumonitis .  sats at home maintained on 6lm/ at rest but with walking sats drop quickly. Has turned up to 7 l/m but still drops.  CT was neg for PE, but showed diffuse increased interstitial markings bilaterally.  Ill-defined soft tissue density in the subcarinal area is essentially unchanged and does have a mass effect upon the left atrium. This is unchanged. She has known metastatic small cell with last chemo /XRT 09/2014  She had aggressive treatment with steroids and diureisis .  Was able to be discharged but required 6l/m O2 . She continued to get dsypniec with minimal activity.  Last visit prednisone decreased slowly.  Currently On Prednisone 30mg  daily  She denies chest pain, fever, discolored mucus , hemoptysis calf pain, or edema.  We discussed her dx , recent CT chest and O2 requirement despite steroids.  Long discussion with pt and sister.  We discussed Hospice home referral , she is in agreement as she does not want to struggle for every breath.  Support and encouragement was provided.     Current Medications, Allergies, Complete Past Medical History, Past Surgical History, Family History, and Social History were reviewed in [employer] record.  ROS  The following are not active complaints unless bolded sore throat, dysphagia, dental problems, itching, sneezing,  nasal congestion or excess/ purulent secretions, ear ache,   fever, chills, sweats, , pleuritic or exertional cp, hemoptysis,  orthopnea pnd or leg swelling, presyncope, palpitations, heartburn, abdominal pain, anorexia, nausea, vomiting, diarrhea  or change in bowel or urinary habits, change in stools or urine, dysuria,hematuria,  rash, arthralgias, visual complaints, headache, numbness weakness or ataxia or problems with walking or coordination,  change in mood/affect or memory.         Objective:   Physical Exam   Chronically ill appearing female in wheelchair, frail   HEENT:  nl dentition, turbinates, and orophanx. Nl external ear canals without cough reflex   NECK :  without JVD/Nodes/TM/ nl carotid upstrokes bilaterally   LUNGS: + acc muscle use, bibasilar crackles    CV:  RRR  no s3 or murmur or increase in P2, no edema   ABD:  soft and nontender with nl excursion in the supine position. No bruits or organomegaly, bowel sounds nl  MS:  warm without deformities, calf tenderness, cyanosis or clubbing           CT chest 01/06/15  No pulmonary emboli. 2. Diffuse increased accentuation of the interstitial markings in both lungs. This could represent slight pulmonary edema or nonspecific pneumonitis.

## 2015-01-29 NOTE — Assessment & Plan Note (Signed)
Increase Prednisone 60mg  daily for 1 week then 50mg  daily for 1 week then 40mg  daily -hold at this dose.  Adjust Oxygen to keep sats >90%.  Follow up Dr. Chase Caller in 4 weeks and As needed   Please contact office for sooner follow up if symptoms do not improve or worsen or seek emergency care

## 2015-01-29 NOTE — Assessment & Plan Note (Addendum)
Known metastatic small cell lung cancer  Now with worsening resp status with probable Radiation Pneumonitis requiring high flow O2 and high dose steroids w/ minimal response.  Long discussion with pt and sister will proceed with hospice care .  Case discussed with Dr. Chase Caller in detail .

## 2015-01-29 NOTE — Telephone Encounter (Signed)
Pt's sister Baker Janus returned call Discussed the below with her.  Baker Janus agrees that pt does not need another portable O2 system.  Her only concern is that pt's tanks run out too quickly on 6-8lpm.  [facility] delivers 6 tanks at a time, and these are typically barely enough to get pt to all of her appts.  Claretta Fraise will be happy to check with [facility] to see if a larger quantity of tanks can be delivered at a time.  Claretta Fraise will call her back if there are any issues with this request and asked her to do the same.  LMOM TCB x1 for Melissa w/ [facility].

## 2015-01-29 NOTE — Telephone Encounter (Signed)
TP had mentioned sending order for portable O2 system that will support up to 8lpm Pt currently uses the last E-tanks with regulators that already do this - pt was recommended to increase her O2 to 7lpm last week but her regulator will not do odd numbers, so pt/sister put pt on 8lpm.  Per TP, okay to continue doing this.  LMOM TCB x1 for pt to discuss.

## 2015-01-29 NOTE — Addendum Note (Signed)
Addended by: Parke Poisson E on: 01/29/2015 01:13 PM   Modules accepted: Orders

## 2015-01-29 NOTE — Patient Instructions (Signed)
Set up Hospice referral  Increase Prednisone 60mg  daily for 1 week then 50mg  daily for 1 week then 40mg  daily -hold at this dose.  Adjust Oxygen to keep sats >90%.  Follow up Dr. Chase Caller in 4 weeks and As needed   Please contact office for sooner follow up if symptoms do not improve or worsen or seek emergency care

## 2015-01-29 NOTE — Addendum Note (Signed)
Addended by: Parke Poisson E on: 01/29/2015 12:39 PM   Modules accepted: Orders

## 2015-01-31 ENCOUNTER — Telehealth: Payer: Self-pay | Admitting: Adult Health

## 2015-01-31 ENCOUNTER — Telehealth: Payer: Self-pay | Admitting: Internal Medicine

## 2015-01-31 NOTE — Telephone Encounter (Signed)
MR has said he will be attending physician for hospice.  Nothing further needed.

## 2015-01-31 NOTE — Telephone Encounter (Signed)
Dewaine Oats calling back stating that pt needs to be admitted today and needs needs to know if MR will be attending  For this pt she would like to know before 5 cause that's when their phone cut off, but says that she can be reach @ [phone] will stay until she hears for someone.Hillery Hunter

## 2015-01-31 NOTE — Telephone Encounter (Signed)
[place] spoke with [place].  She obtained pt info that she needed & was told Referral is in Epic but she needed some additional info from Dr. Chase Caller.  I transferred Dewaine Oats to [place] to put in a phone note to MR.    I called pt's Caregiver Chauncey Mann to let her know that [facility] was aware of the Order & that they should contacting them soon.  Ms. Allene Dillon also had some questions concerning pt's 02 settings.  i advised that I would make Triage aware and have someone call her.  Triage please contact pt's caregiver Baker Janus @ phone number left earlier today to see if you can help her with 02 questions Dawne J Law

## 2015-01-31 NOTE — Telephone Encounter (Signed)
Spoke with Baker Janus, she is aware that hospice order is being processed and of MR's resc.  Nothing further needed.

## 2015-01-31 NOTE — Telephone Encounter (Signed)
Spoke with Dewaine Oats at [facility], wants to see if MR is the attending for this pt's hospice order.  MR are you ok with this?  Thanks!

## 2015-01-31 NOTE — Telephone Encounter (Signed)
pcc's please advise on status of hospice order so I can relay this to patient and caregiver.  Thank you.

## 2015-01-31 NOTE — Telephone Encounter (Signed)
Spoke with Baker Janus (emergency contact), states pt's 02 sats were at mid 38's this morning, has increased 02 up to 9 lpm continuous to keep 02 sats in the 90's.  Pt is sitting in bed, having no complaints at this time.  Pt and Baker Janus are requesting recommendations on this.    Also want to check on the status of hospice referral.  This order was placed when pt was seen by TP on 2/3.  PCC's please advise on this.  Thank you!

## 2015-01-31 NOTE — Telephone Encounter (Signed)
Please expedite hospice referral  Wit this kind of hypoxemia is either hospice or if patients wants [place] to try to solve it or impove - go to ER and get admitted.    Dr. Brand Males, M.D., [facility].C.P Pulmonary and Critical Care Medicine Staff Physician [place] [facility] Pager: [phone], If no answer or between  15:00h - 7:00h: call [phone]  01/31/2015 10:27 AM

## 2015-02-06 ENCOUNTER — Telehealth: Payer: Self-pay | Admitting: Adult Health

## 2015-02-06 NOTE — Telephone Encounter (Signed)
Jeani Hawking called again [facility] admissions nurse) VO for DNR and their docs sign PRN morhphine and ativan for symptom management  Discussed with TP as she ordered the Hospice referral Okay for DNR per TP and Hospice docs to sign  VO given to Yorklyn whom voiced her understanding Nothing further needed at this time; will sign off

## 2015-02-06 NOTE — Telephone Encounter (Signed)
Had received word from [facility] that [facility] would contact pt to set up for additional tanks to be delivered to pt's home Will sign off

## 2015-02-17 ENCOUNTER — Telehealth: Payer: Self-pay | Admitting: Internal Medicine

## 2015-02-17 NOTE — Telephone Encounter (Signed)
Spoke with Cristela Blue, wants to report to MR that pt's BP was 90/72, still taking simvastatin and irbesartan, dyspnea with minimal exertion, 02 96% on 8lpm at rest, respirations at 30.  Pt keeps asking if she will get better.  Pulse stays around 125 bpm.  Pt wants to know if she needs to continue taking irbesartan, simvastatin, delsym, and mucinex.  Pt does not have a cough or mucus production at this time. Pt does have shingles, was put on Famvir for this last week.   Pt does c/o occasional sinus pressure.  Pt uses [facility] on [address].    MR please advise.  Thank you.

## 2015-02-18 ENCOUNTER — Telehealth: Payer: Self-pay | Admitting: Internal Medicine

## 2015-02-18 NOTE — Telephone Encounter (Signed)
Spoke with [employer]. I talked to her earlier this morning about the pt. MR suggested that we have pt's BP medication changed (see telephone encounter 02/17/15). Cristela Blue suggested that Hospice do symptom management so that pt can get what she needs in a more timely manner. Advised that I would get message to MR to advise.  Per MR - this is fine with him.  Cristela Blue is aware.

## 2015-02-18 NOTE — Telephone Encounter (Signed)
Spoke with [employer]. She is aware of MR's recommendations.

## 2015-02-18 NOTE — Telephone Encounter (Signed)
I am h oping she can get better but worry she might not; only time will tell Please use those exact words  PCP TODD,JEFFREY ALLEN, MDcan try to swtich irbesartan to a lower dose beta blocker (b1 specific) like lopressor or bisoprolol  For dyspnea hospice should consider morphine

## 2015-02-18 NOTE — Telephone Encounter (Signed)
lmtcb x1 for Frances Gonzalez.

## 2015-02-18 NOTE — Telephone Encounter (Signed)
Return call.Frances Gonzalez °

## 2015-02-19 ENCOUNTER — Telehealth: Payer: Self-pay | Admitting: Internal Medicine

## 2015-02-19 NOTE — Telephone Encounter (Signed)
I think she is in terminal decline and it sounds like she has few to several days left, few weeks if lucky  THerefore  A ) stop avapro, zocor B) can take delsym , mucinex for comfort C) have hospice focus 100% on comfort and quality of life D) IF needed they can admit to their hopsice unit if they cannot control symptoms

## 2015-02-19 NOTE — Telephone Encounter (Signed)
Called and spoke with Frances Gonzalez from [facility] stated the following:  1.  pts BP is declining , she is having increased dyspnea and has been started on liquid morphine,  She stated that the pt is on avapro and this was last refilled by [facility].  Hospice is not able to take over the avapro as this is not an end of life medication that they dose.  They are requesting that MR make recs on this medication for the pt.  They have held this medication for now but need a decision on to continue it or not.  2.  The family is requesting to taper her off some of her medications.   They are specifically asking about the simvastatin, delsym and mucinex.  If they can just change these to PRN use that would be good too.    Cristela Blue stated that the pts BP on Tuesday was 90/72 with pulse of 122 and respirations at 30.    MR please advise. Thanks  No Known Allergies  Current Outpatient Prescriptions on File Prior to Visit  Medication Sig Dispense Refill  . albuterol (PROVENTIL HFA;VENTOLIN HFA) 108 (90 BASE) MCG/ACT inhaler Inhale 2 puffs into the lungs every 6 (six) hours as needed for wheezing or shortness of breath. 3 Inhaler 3  . budesonide-formoterol (SYMBICORT) 160-4.5 MCG/ACT inhaler Take 2 puffs first thing in am and then another 2 puffs about 12 hours later. 3 Inhaler 3  . dextromethorphan (DELSYM) 30 MG/5ML liquid 2 tsp every 12 hours as needed    . dextromethorphan-guaiFENesin (MUCINEX DM) 30-600 MG per 12 hr tablet Take 1 tablet by mouth 2 (two) times daily as needed for cough.    . irbesartan (AVAPRO) 150 MG tablet Take 75 mg by mouth daily. Half tablet daily    . Multiple Vitamin (MULTIVITAMIN) tablet Take 1 tablet by mouth daily.    . pantoprazole (PROTONIX) 40 MG tablet TAKE 1 TABLET BY MOUTH 30 TO 60 MINUTES BEFORE FIRST MEAL OF THE DAY 90 tablet 0  . predniSONE (DELTASONE) 20 MG tablet Taper as directed at 01/29/15 office visit, hold at 40mg  daily 100 tablet 0  . simvastatin (ZOCOR) 20 MG tablet TAKE 1/2  TABLET BY MOUTH EVERY NIGHT AT BEDTIME 100 tablet 2   No current facility-administered medications on file prior to visit.

## 2015-02-19 NOTE — Telephone Encounter (Signed)
Spoke with Cristela Blue to make her aware of MR's recs.  Nothing further needed.

## 2015-02-20 ENCOUNTER — Ambulatory Visit: Payer: Medicare Other | Admitting: Internal Medicine

## 2015-02-24 ENCOUNTER — Telehealth: Payer: Self-pay | Admitting: Internal Medicine

## 2015-02-24 NOTE — Telephone Encounter (Signed)
s.w pt and cx appt due to pt on hospice.

## 2015-03-06 ENCOUNTER — Telehealth: Payer: Self-pay | Admitting: Internal Medicine

## 2015-03-06 NOTE — Telephone Encounter (Signed)
Spoke with Cristela Blue and notified of recs per TP She verbalized understanding  Nothing further needed

## 2015-03-06 NOTE — Telephone Encounter (Signed)
Maura returned call [phone]

## 2015-03-06 NOTE — Telephone Encounter (Signed)
lmtcb x1 for [employer]

## 2015-03-06 NOTE — Telephone Encounter (Signed)
No she is very weak,  We discussed this last ov, we ordered hospice.  Let me know can call her if needed.

## 2015-03-06 NOTE — Telephone Encounter (Signed)
Pt wants to know if she is able to do chemo, however, hospice docs wants MR's recs on this descision.

## 2015-03-06 NOTE — Telephone Encounter (Signed)
Spoke with the pt's Hospice RN, Cristela Blue  She states that the pt is wanting MR's opinion on whether or not she can tolerate chemo Dr Lyman Speller does not think that she can tolerate chemo, but the pt wants MR's opinion  I advised that MR is out of the office until next wk She asks if we can check with another provider who is familiar with the pt  Pt seen last by TP so will see if she can answer this  Nurse also asking about o2- pt on 8.5 lpm now, and her tank goes up to 10 lpm- she is asking if we think pt may eventually need to go up to 10 lpm or even higher  TP, please advise, thanks

## 2015-03-11 ENCOUNTER — Telehealth: Payer: Self-pay | Admitting: Internal Medicine

## 2015-03-11 NOTE — Telephone Encounter (Signed)
Frances Gonzalez is aware that MR is fine with order.

## 2015-03-11 NOTE — Telephone Encounter (Signed)
I called spoke with Cristela Blue (hospice nurse). They are wanting order fro RT to come in and consult on patient. She is now using 9 liters O2. Please advise MR thanks

## 2015-03-11 NOTE — Telephone Encounter (Signed)
Ok that is fine

## 2015-03-20 ENCOUNTER — Ambulatory Visit: Payer: Medicare Other | Admitting: Internal Medicine

## 2015-03-25 ENCOUNTER — Telehealth: Payer: Self-pay | Admitting: Internal Medicine

## 2015-03-25 NOTE — Telephone Encounter (Signed)
Spoke with Cristela Blue, the pt's Hopsice RN  She is calling as FYI for MR Pt is having increased SOB, anxiety and confusion  She states that her voice is getting softer, as she gets out of breath just speaking a few words  She states that the pt asks often and MR, and wants him to know what is going on with her  Nurse does not think we need to change any meds at this time  She will be faxing over an updated [facility] tomorrow  Will forward to MR so he is aware

## 2015-03-31 ENCOUNTER — Other Ambulatory Visit: Payer: Medicare Other

## 2015-03-31 ENCOUNTER — Other Ambulatory Visit: Payer: Self-pay

## 2015-03-31 ENCOUNTER — Telehealth: Payer: Self-pay | Admitting: Internal Medicine

## 2015-03-31 ENCOUNTER — Ambulatory Visit (HOSPITAL_COMMUNITY): Payer: Medicare Other

## 2015-04-01 NOTE — Telephone Encounter (Signed)
Spoke with Maura with [facility] that the patient was able to talk yesterday 03/31/15 but has now become more weak. Pt had an episode of resp distress over the weekend and is now incontinent and very dyspneic. Pt has already be transferred for end of live care.  Will send to Dr Chase Caller as Juluis Rainier.

## 2015-04-01 NOTE — Telephone Encounter (Signed)
Yes go ahead. Is she still conscious and able to talk?

## 2015-04-01 NOTE — Telephone Encounter (Signed)
Spoke with [facility], requesting verbal order for transfer to [facility].  Pt is transferring today. Please advise Dr Chase Caller. Thanks.

## 2015-04-07 ENCOUNTER — Telehealth: Payer: Self-pay

## 2015-04-07 ENCOUNTER — Ambulatory Visit: Payer: Medicare Other | Admitting: Internal Medicine

## 2015-04-07 NOTE — Telephone Encounter (Signed)
Received cremation death certificate from [facility] 04/07/15.  Will take upstairs for Dr. Chase Caller to sign.  Received back 04/09/15 and called for pick-up.

## 2015-04-27 DEATH — deceased

## 2016-01-27 ENCOUNTER — Telehealth: Payer: Self-pay | Admitting: Internal Medicine

## 2016-01-27 NOTE — Telephone Encounter (Signed)
lmtcb for [employer]. I do not see any forms

## 2016-01-27 NOTE — Telephone Encounter (Signed)
Anderson Malta returned call [phone]

## 2016-01-27 NOTE — Telephone Encounter (Signed)
lmtcb X2 for [place] with [facility]

## 2016-01-28 NOTE — Telephone Encounter (Signed)
LMTCB for [employer]

## 2016-06-14 ENCOUNTER — Other Ambulatory Visit: Payer: Self-pay | Admitting: Nurse Practitioner

## 2016-12-17 IMAGING — CT CT CHEST W/ CM
2 of 5 series · 15 of 46 positions shown, 17 images · IV contrast (OMNIPAQUE)
Comparison: 09/11/2014

CLINICAL DATA: Lung cancer. Chemotherapy completed in July 2014.
Radiation therapy completed in September 2014. Prior right upper
lobectomy. Mediastinal mass contained small cell cancer.

EXAM:
CT CHEST, ABDOMEN, AND PELVIS WITH CONTRAST
TECHNIQUE: Multidetector CT imaging of the chest, abdomen and pelvis was
performed following the standard protocol during bolus
administration of intravenous contrast.
CONTRAST:  100mL OMNIPAQUE IOHEXOL 300 MG/ML  SOLN

[Series 2: cap with st · axial · 0.76mm/px · z∈[-603,-43]mm · 12 of 127 slices shown, 14 images]
[im 8/127  soft-tissue]
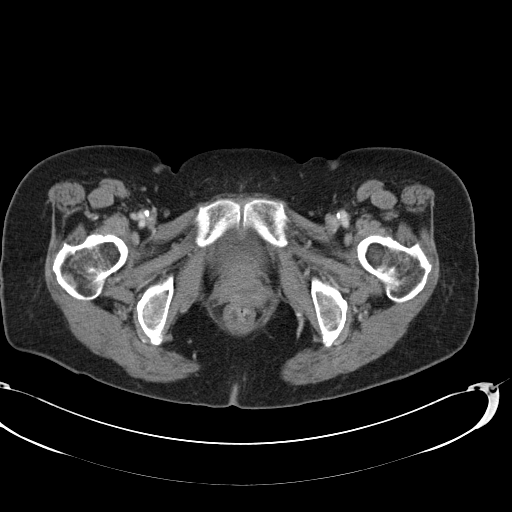
[im 8/127  bone]
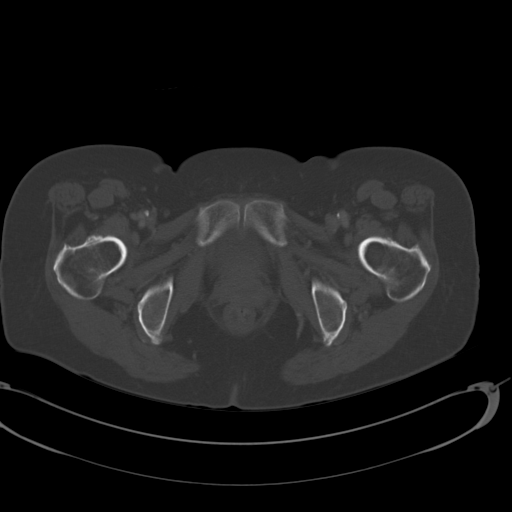
[im 22/127  soft-tissue]
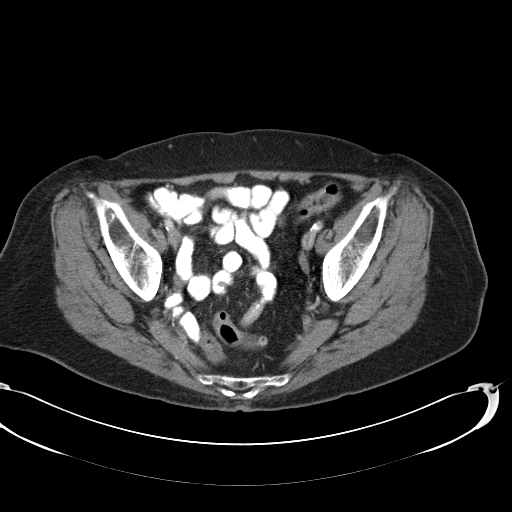
[im 29/127  soft-tissue]
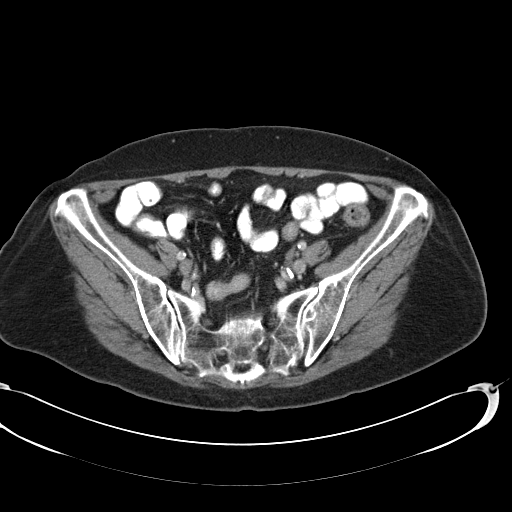
[im 36/127  soft-tissue]
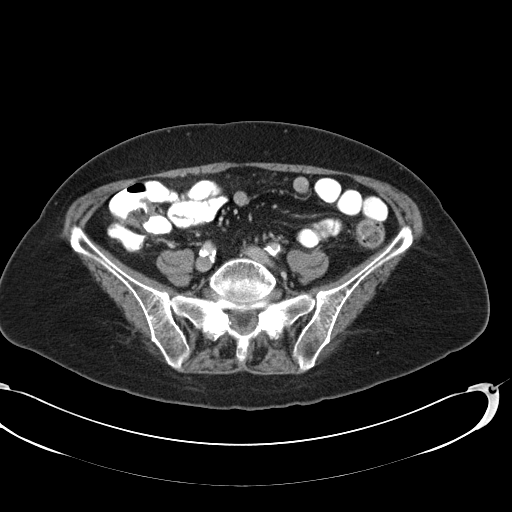
[im 50/127  soft-tissue]
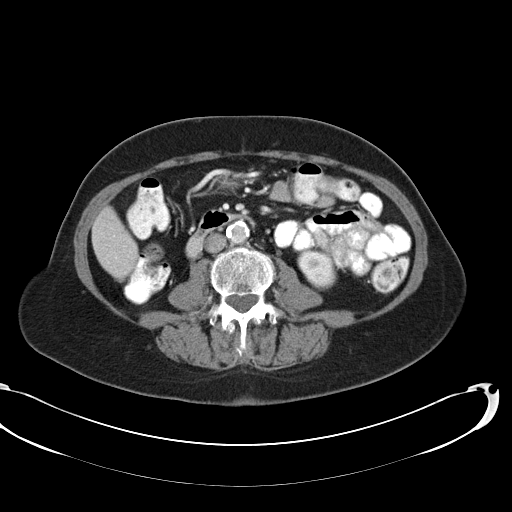
[im 57/127  soft-tissue]
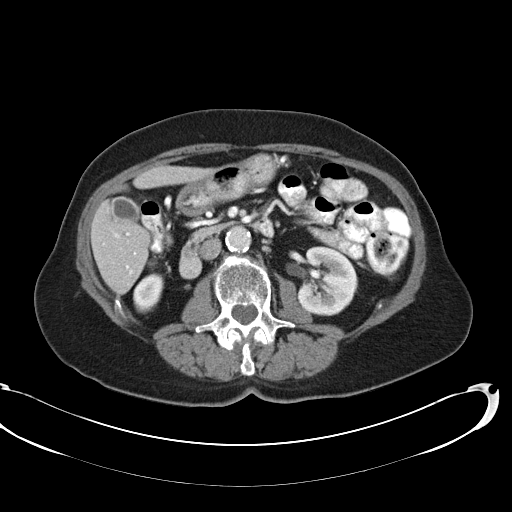
[im 71/127  soft-tissue]
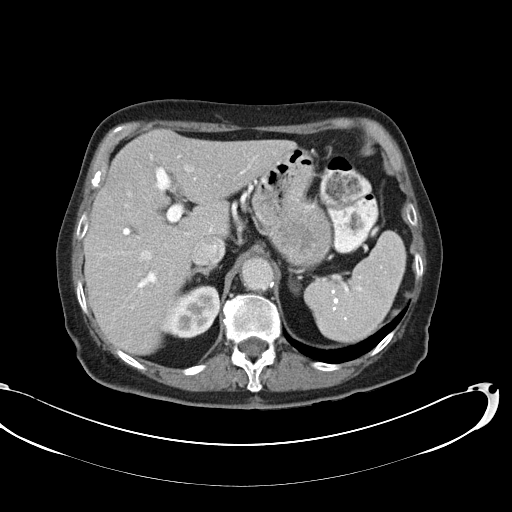
[im 78/127  soft-tissue]
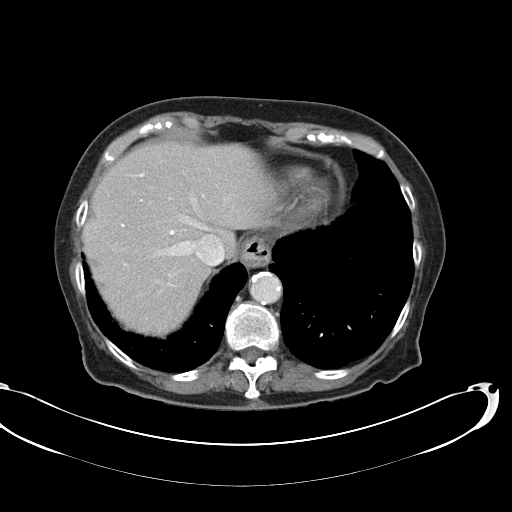
[im 92/127  soft-tissue]
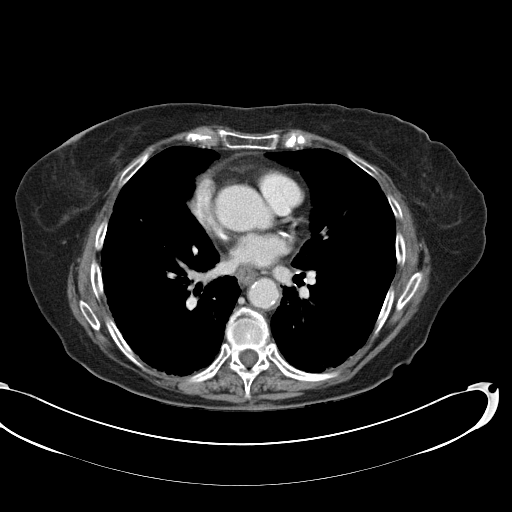
[im 92/127  bone]
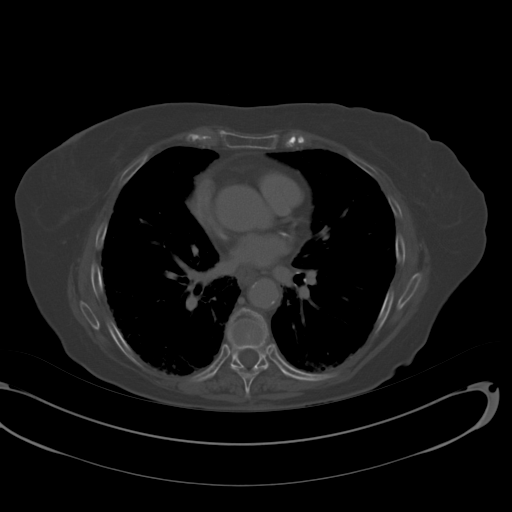
[im 99/127  soft-tissue]
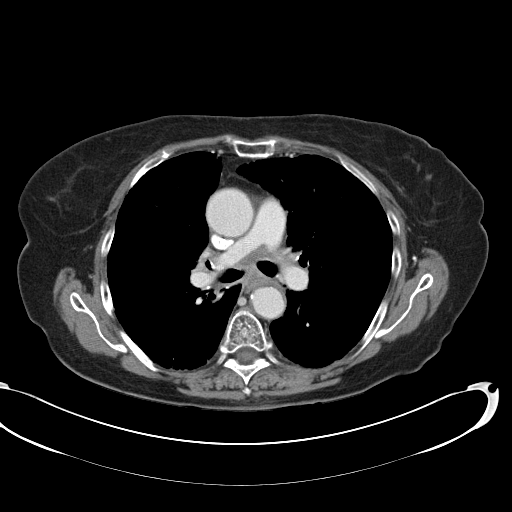
[im 106/127  soft-tissue]
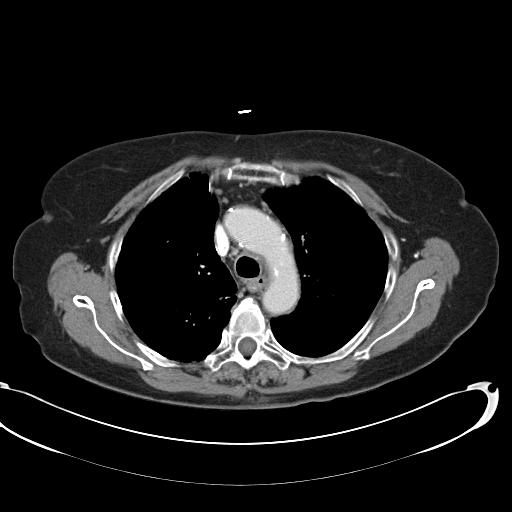
[im 120/127  soft-tissue]
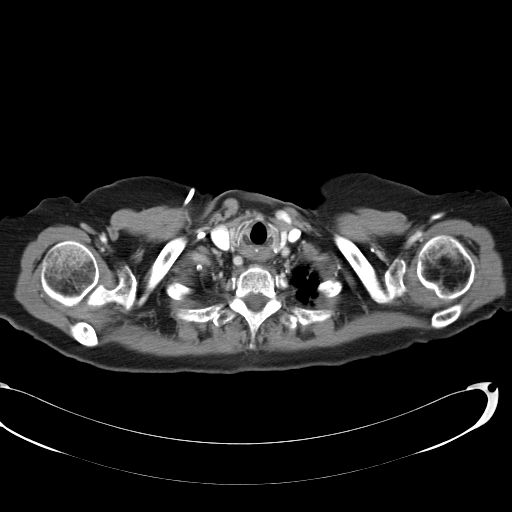

[Series 602: coronal images · coronal · 1.24mm/px · 3 of 78 slices shown]
[im 26/78  soft-tissue]
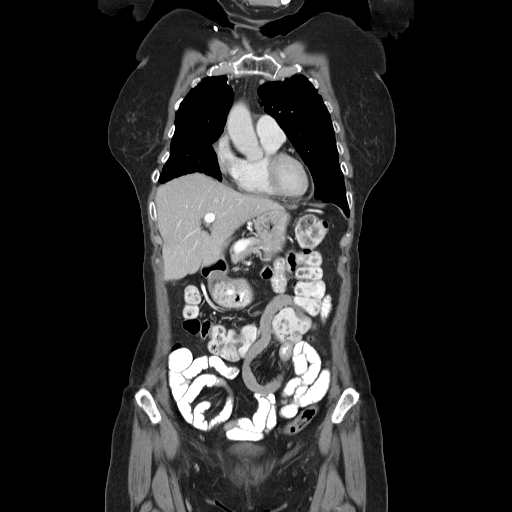
[im 35/78  soft-tissue]
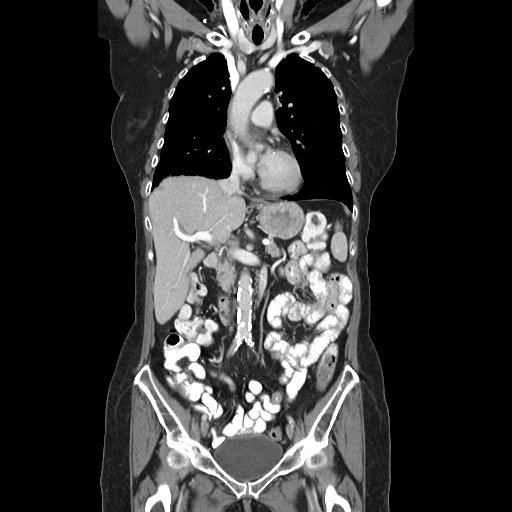
[im 43/78  soft-tissue]
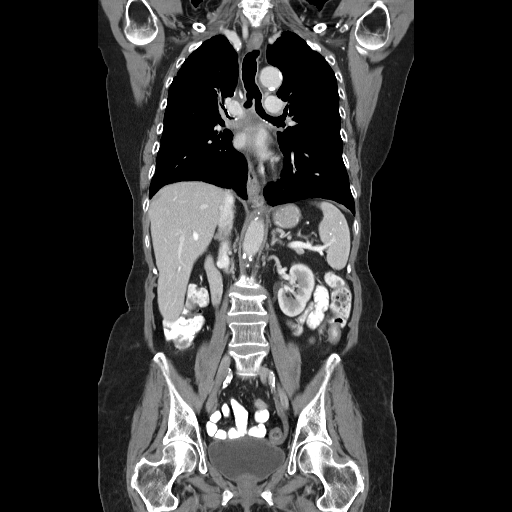

[15 of 46 positions shown; findings below may reference images not displayed]

FINDINGS: CT CHEST FINDINGS

Indistinct loss of tissue planes in the subcarinal region. Difficult
to measure due to the infiltrative nature of this process, grossly
similar to prior. Right paratracheal node short axis diameter 5 mm
on image 21 of series 2, stable. Stable small prevascular lymph
nodes.

Small pericardial effusion. Small hiatal hernia. Tortuous thoracic
aorta with ascending thoracic aorta measuring 3.9 cm transverse.
Coronary artery atherosclerosis.

Biapical pleural parenchymal scarring. Extensive centrilobular
emphysema. Chronic interstitial accentuation diffusely. Perihilar
scarring bilaterally, possible Mild paramediastinal radiation
pneumonitis.

Left lower lobe pulmonary nodule 0.8 by 0.6 cm on image 49 of series
4, essentially stable from 8484. Prior nodularity in the left
suprahilar region along an area of volume loss measures 9 by 5 mm
(image 23/series 4), and by my measurements was previously about 9
by 13 mm.

CT ABDOMEN AND PELVIS FINDINGS

Hepatobiliary: Contracted gallbladder.  Otherwise negative.

Pancreas: Unremarkable

Spleen: Old granulomatous disease of the spleen, stable.

Adrenals/Urinary Tract: 2.3 by 1.4 cm left adrenal adenoma,
low-density.

Stomach/Bowel: Small hiatal hernia.

Vascular/Lymphatic: Aortoiliac atherosclerotic vascular disease.
Long segment of low-density in the left common femoral vein,
external iliac vein, and common iliac vein may conceivably be due to
venous contrast mixing but DVT is not excluded.

Reproductive: Unremarkable

Other: No supplemental non-categorized findings.

Musculoskeletal: Prior compression fractures at L1 and L3, and
probably along the super endplate of T11, no change from prior.
Lower lumbar degenerative facet arthropathy.
IMPRESSION: 1. Possible pelvic and left common femoral vein deep vein
thrombosis. The appearance could alternatively be caused by venous
mixing of contrast. Bilateral lower extremity Doppler venous
ultrasound is recommended.
2. The nodular region adjacent to the volume loss in the left
suprahilar region has reduced nodular conspicuity, currently 5 by 9
mm by my measurements, and previously 9 by 12 mm.
3. The chest appear otherwise stable, with ill-defined infiltration
in the subcarinal region likely primarily from therapy, but possibly
conceal even residual underlying malignancy.
4. Ancillary findings include left adrenal adenoma, old
thoracolumbar compression fractures, a small hiatal hernia, and a
small pericardial effusion.
5. Ectatic ascending thoracic aorta at 3.9 cm. Recommend annual
imaging followup by CTA or MRA. This recommendation follows 8484
ACCF/AHA/AATS/ACR/ASA/SCA/NASHIE/OLENA/SERDEN/GRETCHEN Guidelines for the
Diagnosis and Management of Patients With Thoracic Aortic Disease.
Circulation. 8484; 121: [id number]

## 2017-01-11 IMAGING — DX DG CHEST 1V PORT
1 series · 1 of 1 positions shown · non-contrast
Comparison: 01/06/2015 chest CT and chest x-ray. 12/31/2013 chest
x-ray.

CLINICAL DATA: 79-year-old female with history of high blood
pressure and lung cancer presenting with hypoxia. Subsequent
encounter.

EXAM:
PORTABLE CHEST - 1 VIEW

[chest ap]
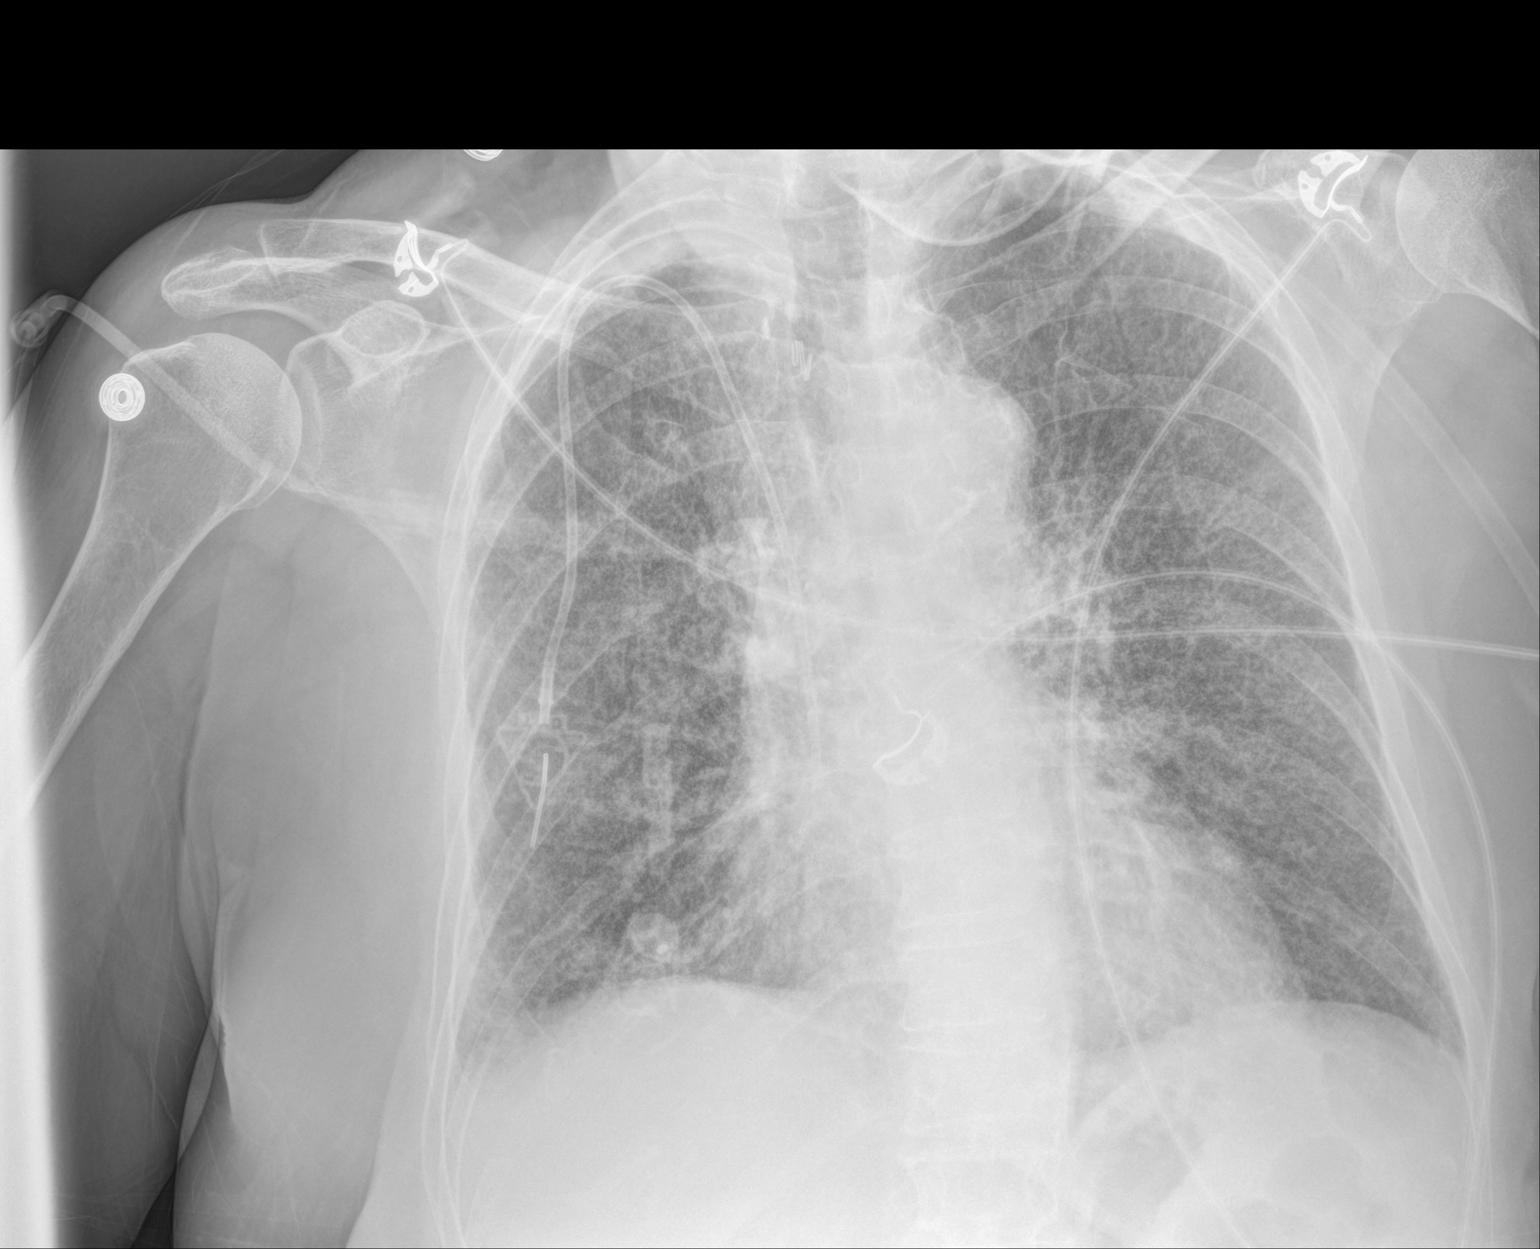

[1 of 1 positions shown; findings below may reference images not displayed]

FINDINGS: Right MediPort catheter tip distal superior vena cava level. No
gross pneumothorax.

Postsurgical changes right upper lobe.

Biapical pleural thickening greater on the right similar to prior
exam.

Diffuse increased lung markings represent a change when compared
9460 and may represent pulmonary vascular congestion/pulmonary edema
superimposed chronic lung changes. Nonspecific pneumonitis/
interstitial infiltrate not excluded. Interstitial spread of tumor
felt to be a secondary less likely consideration. This is without
significant change when compared to the recent chest x-ray taking
into account differences in technique.

Heart size top-normal.

Calcified tortuous aorta.
IMPRESSION: Diffuse increased interstitial markings may represent pulmonary
vascular congestion/ pulmonary edema superimposed upon chronic
changes. Nonspecific pneumonitis as questioned on CT is also
consideration. Appearance without significant change since recent
chest x-ray (taking into account differences in technique).

## 2017-01-14 IMAGING — DX DG CHEST 1V PORT
1 series · 1 of 1 positions shown · non-contrast
Comparison: 01/08/2015.  12/31/2013.

CLINICAL DATA: Respiratory failure.

EXAM:
PORTABLE CHEST - 1 VIEW

[chest ap]
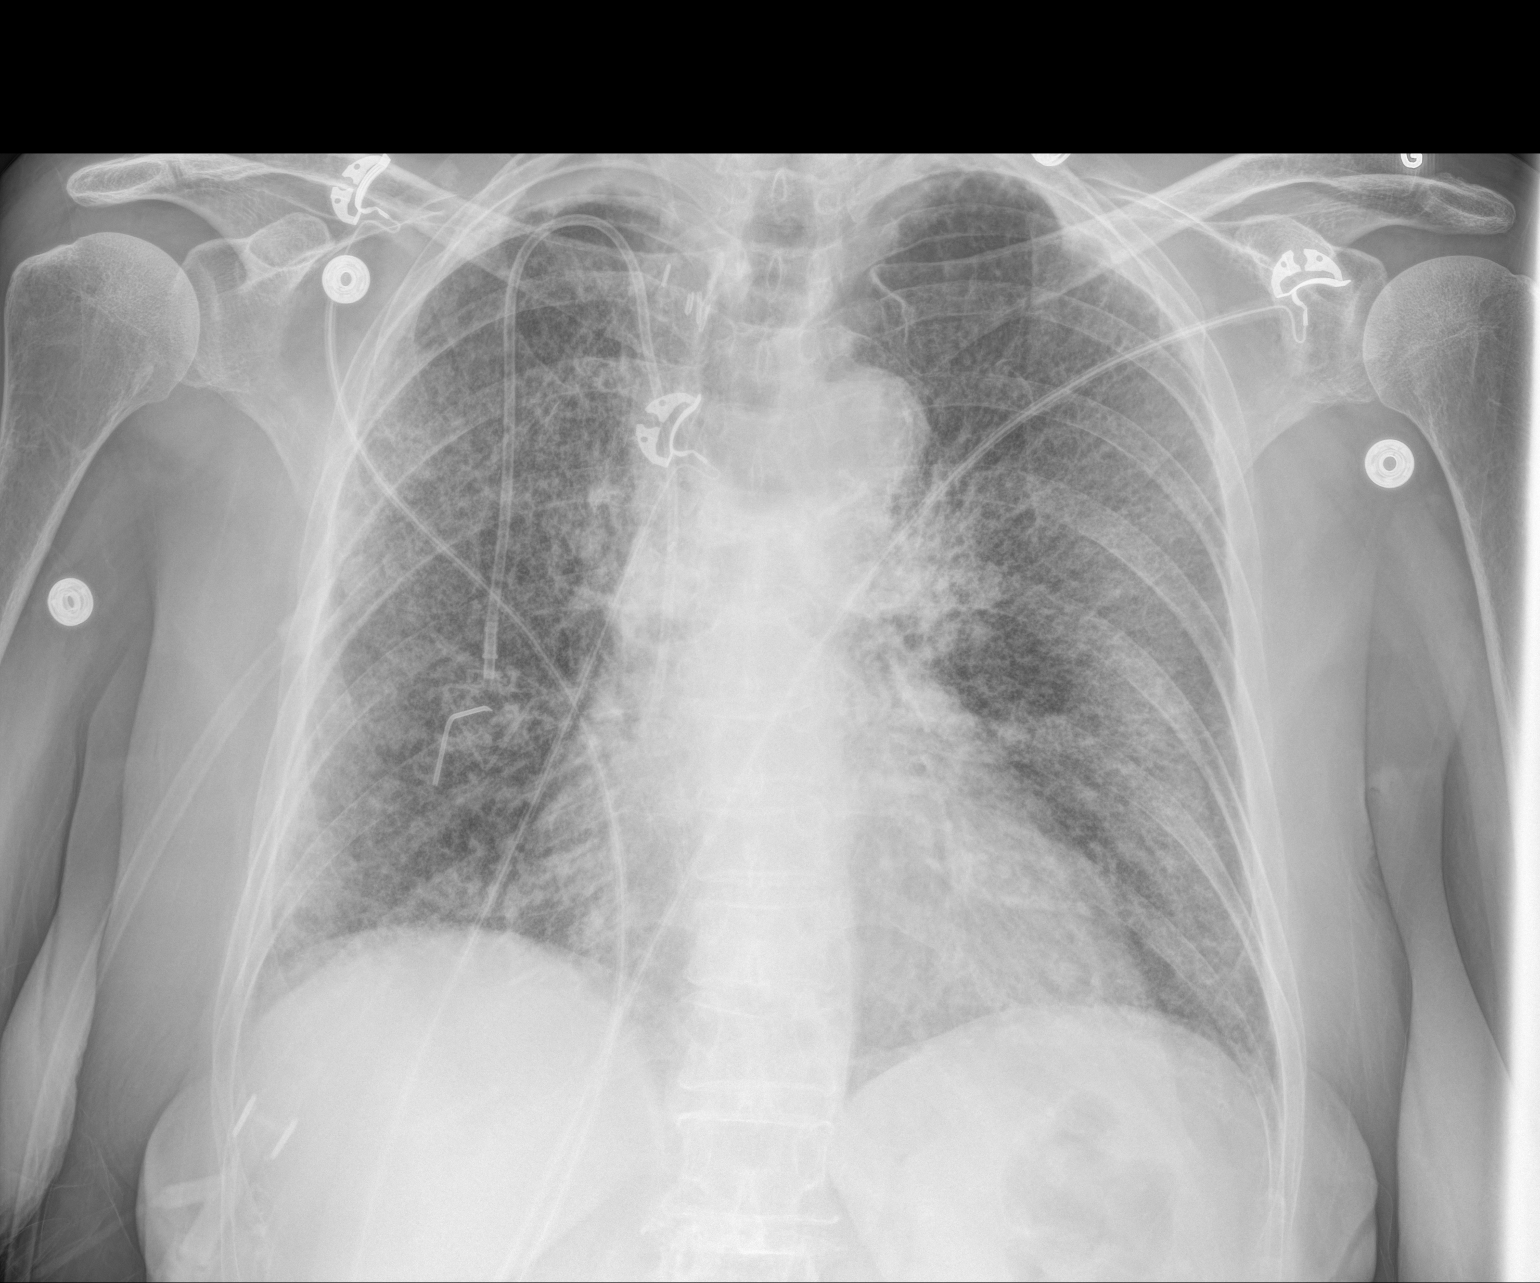

[1 of 1 positions shown; findings below may reference images not displayed]

FINDINGS: Heart 4 catheter stable position. Stable cardiomegaly with normal
pulmonary vascularity. Progressive bilateral interstitial prominence
noted. This is consistent with active interstitial disease such as
interstitial pneumonitis and/or edema. Underlying chronic
interstitial disease is present. No pleural effusion or
pneumothorax. No acute bony abnormality.
IMPRESSION: 1. Progressive bilateral pleural interstitial prominence consistent
with active interstitial disease as is interstitial pneumonitis or
interstitial pulmonary edema. Underlying chronic interstitial
disease is present.
2. Stable cardiomegaly.  No pulmonary venous congestion.
3. Port-A-Cath in stable position.
